# Patient Record
Sex: Female | Born: 1950
Health system: Southern US, Community
[De-identification: ages and names within clinical notes are randomized; demographics above are authoritative.]

## PROBLEM LIST (undated history)

## (undated) DIAGNOSIS — T7840XA Allergy, unspecified, initial encounter: Secondary | ICD-10-CM

## (undated) DIAGNOSIS — H9190 Unspecified hearing loss, unspecified ear: Secondary | ICD-10-CM

## (undated) DIAGNOSIS — F419 Anxiety disorder, unspecified: Secondary | ICD-10-CM

## (undated) DIAGNOSIS — I10 Essential (primary) hypertension: Secondary | ICD-10-CM

## (undated) DIAGNOSIS — K219 Gastro-esophageal reflux disease without esophagitis: Secondary | ICD-10-CM

## (undated) DIAGNOSIS — K5790 Diverticulosis of intestine, part unspecified, without perforation or abscess without bleeding: Secondary | ICD-10-CM

## (undated) DIAGNOSIS — J45909 Unspecified asthma, uncomplicated: Secondary | ICD-10-CM

## (undated) DIAGNOSIS — K589 Irritable bowel syndrome without diarrhea: Secondary | ICD-10-CM

## (undated) DIAGNOSIS — N3281 Overactive bladder: Secondary | ICD-10-CM

## (undated) DIAGNOSIS — J449 Chronic obstructive pulmonary disease, unspecified: Secondary | ICD-10-CM

## (undated) DIAGNOSIS — E785 Hyperlipidemia, unspecified: Secondary | ICD-10-CM

## (undated) HISTORY — DX: Allergy, unspecified, initial encounter: T78.40XA

## (undated) HISTORY — DX: Essential (primary) hypertension: I10

## (undated) HISTORY — DX: Diverticulosis of intestine, part unspecified, without perforation or abscess without bleeding: K57.90

## (undated) HISTORY — DX: Unspecified asthma, uncomplicated: J45.909

## (undated) HISTORY — DX: Irritable bowel syndrome, unspecified: K58.9

## (undated) HISTORY — DX: Anxiety disorder, unspecified: F41.9

## (undated) HISTORY — DX: Hyperlipidemia, unspecified: E78.5

## (undated) HISTORY — DX: Overactive bladder: N32.81

## (undated) HISTORY — DX: Gastro-esophageal reflux disease without esophagitis: K21.9

---
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
## 1977-05-27 HISTORY — PX: PARTIAL HYSTERECTOMY: SHX80

## 1995-05-28 HISTORY — PX: ABDOMINAL HYSTERECTOMY: SHX81

## 1995-05-28 HISTORY — PX: APPENDECTOMY: SHX54

## 2004-09-26 ENCOUNTER — Ambulatory Visit: Payer: Self-pay | Admitting: Family Medicine

## 2004-11-06 ENCOUNTER — Ambulatory Visit: Payer: Self-pay | Admitting: Internal Medicine

## 2005-10-18 ENCOUNTER — Ambulatory Visit: Payer: Self-pay | Admitting: Family Medicine

## 2007-07-03 ENCOUNTER — Ambulatory Visit: Payer: Self-pay | Admitting: Family Medicine

## 2009-07-12 ENCOUNTER — Emergency Department: Payer: Self-pay | Admitting: Unknown Physician Specialty

## 2012-01-23 DIAGNOSIS — Z0189 Encounter for other specified special examinations: Secondary | ICD-10-CM | POA: Insufficient documentation

## 2013-05-27 HISTORY — PX: COLONOSCOPY: SHX174

## 2013-06-24 ENCOUNTER — Ambulatory Visit: Payer: Self-pay | Admitting: Family Medicine

## 2013-10-19 LAB — HM COLONOSCOPY

## 2013-12-13 DIAGNOSIS — K5790 Diverticulosis of intestine, part unspecified, without perforation or abscess without bleeding: Secondary | ICD-10-CM | POA: Insufficient documentation

## 2014-08-10 LAB — LIPID PANEL
Cholesterol: 182 mg/dL (ref 0–200)
HDL: 51 mg/dL (ref 35–70)
LDL Cholesterol: 102 mg/dL
TRIGLYCERIDES: 145 mg/dL (ref 40–160)

## 2014-08-10 LAB — TSH: TSH: 3.9 u[IU]/mL (ref <=5.90)

## 2014-08-11 LAB — HM MAMMOGRAPHY: HM Mammogram: NORMAL

## 2014-09-25 HISTORY — PX: UPPER GASTROINTESTINAL ENDOSCOPY: SHX188

## 2014-10-05 ENCOUNTER — Encounter: Payer: Self-pay | Admitting: Internal Medicine

## 2014-10-05 DIAGNOSIS — I10 Essential (primary) hypertension: Secondary | ICD-10-CM | POA: Insufficient documentation

## 2014-10-05 DIAGNOSIS — J302 Other seasonal allergic rhinitis: Secondary | ICD-10-CM | POA: Insufficient documentation

## 2014-10-05 DIAGNOSIS — Z8719 Personal history of other diseases of the digestive system: Secondary | ICD-10-CM | POA: Insufficient documentation

## 2014-10-05 DIAGNOSIS — J4520 Mild intermittent asthma, uncomplicated: Secondary | ICD-10-CM | POA: Insufficient documentation

## 2014-10-05 DIAGNOSIS — K2100 Gastro-esophageal reflux disease with esophagitis, without bleeding: Secondary | ICD-10-CM | POA: Insufficient documentation

## 2014-10-05 DIAGNOSIS — E782 Mixed hyperlipidemia: Secondary | ICD-10-CM | POA: Insufficient documentation

## 2014-10-05 DIAGNOSIS — K648 Other hemorrhoids: Secondary | ICD-10-CM | POA: Insufficient documentation

## 2014-10-05 DIAGNOSIS — K581 Irritable bowel syndrome with constipation: Secondary | ICD-10-CM | POA: Insufficient documentation

## 2014-11-21 ENCOUNTER — Encounter: Payer: Self-pay | Admitting: Internal Medicine

## 2014-11-22 ENCOUNTER — Ambulatory Visit (INDEPENDENT_AMBULATORY_CARE_PROVIDER_SITE_OTHER): Payer: Medicare PPO | Admitting: Internal Medicine

## 2014-11-22 ENCOUNTER — Encounter: Payer: Self-pay | Admitting: Internal Medicine

## 2014-11-22 ENCOUNTER — Other Ambulatory Visit: Payer: Self-pay

## 2014-11-22 VITALS — BP 132/82 | HR 82 | Temp 98.0°F | Ht 65.0 in | Wt 153.0 lb

## 2014-11-22 DIAGNOSIS — K5732 Diverticulitis of large intestine without perforation or abscess without bleeding: Secondary | ICD-10-CM | POA: Diagnosis not present

## 2014-11-22 MED ORDER — CIPROFLOXACIN HCL 500 MG PO TABS: 500.0000 mg | ORAL_TABLET | Freq: Two times a day (BID) | ORAL | Status: DC

## 2014-11-22 MED ORDER — METRONIDAZOLE 500 MG PO TABS: 500.0000 mg | ORAL_TABLET | Freq: Three times a day (TID) | ORAL | Status: DC

## 2014-11-22 MED ORDER — PROMETHAZINE HCL 25 MG PO TABS: 25.0000 mg | ORAL_TABLET | Freq: Three times a day (TID) | ORAL | Status: DC | PRN

## 2014-11-22 NOTE — Patient Instructions (Signed)

## 2014-11-22 NOTE — Progress Notes (Signed)
Date:  11/22/2014   Name:  Sarah Hernandez   DOB:  1950/08/11   MRN:  [id number]   Chief Complaint: Abdominal Pain Abdominal Pain This is a new problem. The current episode started in the past 7 days. The onset quality is gradual. The problem has been unchanged. The pain is located in the RLQ. The pain is at a severity of 3/10. The patient is experiencing no pain. The quality of the pain is colicky and cramping. Associated symptoms include belching, diarrhea, flatus, nausea and vomiting (one episode). Pertinent negatives include no constipation, fever or headaches. Relieved by: pain medication. She has tried acetaminophen (bland diet) for the symptoms. The treatment provided mild relief.  She thought her symptoms might have been triggered by eating ice cream but that was 10 days ago.  She did have popcorn recently and has a history of diverticulitis.  Review of Systems:  Review of Systems  Constitutional: Negative for fever, chills and fatigue.  Respiratory: Negative for chest tightness and shortness of breath.   Cardiovascular: Negative for chest pain.  Gastrointestinal: Positive for nausea, vomiting (one episode), abdominal pain, diarrhea and flatus. Negative for constipation and blood in stool.  Genitourinary: Negative for urgency and pelvic pain.  Musculoskeletal: Negative for back pain.  Allergic/Immunologic: Negative for food allergies.  Neurological: Negative for headaches.    Patient Active Problem List   Diagnosis Date Noted  . Esophagitis, reflux 10/05/2014  . Hemorrhoids, internal 10/05/2014  . History of digestive system disease 10/05/2014  . HLD (hyperlipidemia) 10/05/2014  . Benign hypertension 10/05/2014  . Irritable bowel syndrome with constipation 10/05/2014  . Asthma, mild intermittent 10/05/2014  . Allergic rhinitis, seasonal 10/05/2014    Prior to Admission medications   Medication Sig Start Date End Date Taking? Authorizing Provider  albuterol (PROVENTIL  HFA;VENTOLIN HFA) 108 (90 BASE) MCG/ACT inhaler Inhale 2 puffs into the lungs 4 (four) times daily as needed. 08/10/14  Yes Historical Provider, MD  albuterol (PROVENTIL) (2.5 MG/3ML) 0.083% nebulizer solution Inhale 3 mLs into the lungs 4 (four) times daily as needed.   Yes Historical Provider, MD  atorvastatin (LIPITOR) 80 MG tablet Take 0.5 tablets by mouth at bedtime. 08/10/14  Yes Historical Provider, MD  hyoscyamine (LEVSIN, ANASPAZ) 0.125 MG tablet Take 1 tablet by mouth 4 (four) times daily as needed. 08/10/14  Yes Historical Provider, MD  lisinopril (PRINIVIL,ZESTRIL) 20 MG tablet Take 1 tablet by mouth daily. 08/10/14  Yes Historical Provider, MD  omeprazole (PRILOSEC OTC) 20 MG tablet Take 1 tablet by mouth daily. 08/17/14  Yes Historical Provider, MD  pantoprazole (PROTONIX) 40 MG tablet Take 1 tablet by mouth 2 (two) times daily. 10/05/14  Yes Historical Provider, MD  sertraline (ZOLOFT) 50 MG tablet Take 1 tablet by mouth daily. 08/10/14  Yes Historical Provider, MD    No Known Allergies  Past Surgical History  Procedure Laterality Date  . Partial hysterectomy  1979    bleeding from fibroids  . Tonsillectomy  1966  . Upper gastrointestinal endoscopy  09/2014    esophagitis, gastritis - pathology benign  . Colonoscopy  2015    diverticulosis    History  Substance Use Topics  . Smoking status: Never Smoker   . Smokeless tobacco: Not on file  . Alcohol Use: 4.2 oz/week    7 Standard drinks or equivalent per week     Medication list has been reviewed and updated.  Physical Examination:  Physical Exam  Constitutional: She appears well-developed and well-nourished. No distress.  Neck: Normal range of motion. Neck supple.  Cardiovascular: Normal rate, regular rhythm and normal heart sounds.   Pulmonary/Chest: Effort normal and breath sounds normal.  Abdominal: Soft. Normal appearance. Bowel sounds are increased. There is no hepatosplenomegaly. There is tenderness in the left  lower quadrant. There is no rigidity, no rebound and no guarding.    BP 132/82 mmHg  Pulse 82  Temp(Src) 98 F (36.7 C)  Ht 5\' 5"  (1.651 m)  Wt 153 lb (69.4 kg)  BMI 25.46 kg/m2  SpO2 97%  Assessment and Plan: 1. Diverticulitis of large intestine without perforation or abscess without bleeding Continue bland diet and sufficient fluids Follow up if worsening symptoms - ciprofloxacin (CIPRO) 500 MG tablet; Take 1 tablet (500 mg total) by mouth 2 (two) times daily.  Dispense: 20 tablet; Refill: 0 - metroNIDAZOLE (FLAGYL) 500 MG tablet; Take 1 tablet (500 mg total) by mouth 3 (three) times daily.  Dispense: 30 tablet; Refill: 0 - promethazine (PHENERGAN) 25 MG tablet; Take 1 tablet (25 mg total) by mouth every 8 (eight) hours as needed for nausea or vomiting.  Dispense: 20 tablet; Refill: 0 - CBC with Differential/Platelet - Comprehensive metabolic panel   Bari EdwardLaura Anaka Beazer, MD [facility]  11/22/2014

## 2014-11-23 LAB — COMPREHENSIVE METABOLIC PANEL
ALT: 12 IU/L (ref 0–32)
AST: 12 IU/L (ref 0–40)
Albumin/Globulin Ratio: 1.4 (ref 1.1–2.5)
Albumin: 4.1 g/dL (ref 3.6–4.8)
Alkaline Phosphatase: 130 IU/L — ABNORMAL HIGH (ref 39–117)
BUN / CREAT RATIO: 18 (ref 11–26)
BUN: 14 mg/dL (ref 8–27)
Bilirubin Total: 0.4 mg/dL (ref 0.0–1.2)
CALCIUM: 9.4 mg/dL (ref 8.7–10.3)
CHLORIDE: 99 mmol/L (ref 97–108)
CO2: 24 mmol/L (ref 18–29)
Creatinine, Ser: 0.8 mg/dL (ref 0.57–1.00)
GFR calc non Af Amer: 77 mL/min/{1.73_m2} (ref >59)
GFR, EST AFRICAN AMERICAN: 89 mL/min/{1.73_m2} (ref >59)
Globulin, Total: 3 g/dL (ref 1.5–4.5)
Glucose: 87 mg/dL (ref 65–99)
POTASSIUM: 4.5 mmol/L (ref 3.5–5.2)
SODIUM: 140 mmol/L (ref 134–144)
TOTAL PROTEIN: 7.1 g/dL (ref 6.0–8.5)

## 2014-11-23 LAB — CBC WITH DIFFERENTIAL/PLATELET
Basophils Absolute: 0 10*3/uL (ref 0.0–0.2)
Basos: 0 %
EOS (ABSOLUTE): 0.2 10*3/uL (ref 0.0–0.4)
Eos: 2 %
HEMATOCRIT: 38 % (ref 34.0–46.6)
Hemoglobin: 12.6 g/dL (ref 11.1–15.9)
IMMATURE GRANS (ABS): 0 10*3/uL (ref 0.0–0.1)
IMMATURE GRANULOCYTES: 0 %
LYMPHS: 19 %
Lymphocytes Absolute: 1.5 10*3/uL (ref 0.7–3.1)
MCH: 28.6 pg (ref 26.6–33.0)
MCHC: 33.2 g/dL (ref 31.5–35.7)
MCV: 86 fL (ref 79–97)
MONOCYTES: 6 %
Monocytes Absolute: 0.5 10*3/uL (ref 0.1–0.9)
NEUTROS PCT: 73 %
Neutrophils Absolute: 6 10*3/uL (ref 1.4–7.0)
Platelets: 420 10*3/uL — ABNORMAL HIGH (ref 150–379)
RBC: 4.4 x10E6/uL (ref 3.77–5.28)
RDW: 13.6 % (ref 12.3–15.4)
WBC: 8.2 10*3/uL (ref 3.4–10.8)

## 2015-03-08 ENCOUNTER — Ambulatory Visit (INDEPENDENT_AMBULATORY_CARE_PROVIDER_SITE_OTHER): Payer: Medicare PPO

## 2015-03-08 DIAGNOSIS — Z23 Encounter for immunization: Secondary | ICD-10-CM

## 2015-04-25 ENCOUNTER — Other Ambulatory Visit: Payer: Self-pay

## 2015-04-25 ENCOUNTER — Encounter: Payer: Self-pay | Admitting: Internal Medicine

## 2015-04-25 MED ORDER — SERTRALINE HCL 50 MG PO TABS: 50.0000 mg | ORAL_TABLET | Freq: Every day | ORAL | Status: DC

## 2015-05-02 NOTE — Telephone Encounter (Signed)
pts coming in on 08/16/15

## 2015-08-03 ENCOUNTER — Other Ambulatory Visit: Payer: Self-pay | Admitting: Internal Medicine

## 2015-08-09 NOTE — Telephone Encounter (Signed)
pts coming in on 08/16/15 @ 830 for cpe

## 2015-08-11 ENCOUNTER — Other Ambulatory Visit: Payer: Self-pay | Admitting: Internal Medicine

## 2015-08-11 ENCOUNTER — Encounter: Payer: Self-pay | Admitting: Internal Medicine

## 2015-08-16 ENCOUNTER — Encounter: Payer: Self-pay | Admitting: Internal Medicine

## 2015-08-16 ENCOUNTER — Other Ambulatory Visit: Payer: Self-pay | Admitting: Internal Medicine

## 2015-08-16 ENCOUNTER — Ambulatory Visit (INDEPENDENT_AMBULATORY_CARE_PROVIDER_SITE_OTHER): Payer: Medicare PPO | Admitting: Internal Medicine

## 2015-08-16 VITALS — BP 142/86 | HR 88 | Ht 65.0 in | Wt 162.6 lb

## 2015-08-16 DIAGNOSIS — K2100 Gastro-esophageal reflux disease with esophagitis, without bleeding: Secondary | ICD-10-CM

## 2015-08-16 DIAGNOSIS — E782 Mixed hyperlipidemia: Secondary | ICD-10-CM | POA: Diagnosis not present

## 2015-08-16 DIAGNOSIS — Z1231 Encounter for screening mammogram for malignant neoplasm of breast: Secondary | ICD-10-CM | POA: Diagnosis not present

## 2015-08-16 DIAGNOSIS — I10 Essential (primary) hypertension: Secondary | ICD-10-CM | POA: Diagnosis not present

## 2015-08-16 DIAGNOSIS — Z1159 Encounter for screening for other viral diseases: Secondary | ICD-10-CM

## 2015-08-16 DIAGNOSIS — K581 Irritable bowel syndrome with constipation: Secondary | ICD-10-CM

## 2015-08-16 DIAGNOSIS — Z0000 Encounter for general adult medical examination without abnormal findings: Secondary | ICD-10-CM | POA: Diagnosis not present

## 2015-08-16 DIAGNOSIS — J4520 Mild intermittent asthma, uncomplicated: Secondary | ICD-10-CM | POA: Diagnosis not present

## 2015-08-16 LAB — POCT URINALYSIS DIPSTICK
Bilirubin, UA: NEGATIVE
GLUCOSE UA: NEGATIVE
Ketones, UA: NEGATIVE
LEUKOCYTES UA: NEGATIVE
NITRITE UA: NEGATIVE
PROTEIN UA: NEGATIVE
SPEC GRAV UA: 1.01
UROBILINOGEN UA: 0.2
pH, UA: 6

## 2015-08-16 MED ORDER — OMEPRAZOLE 20 MG PO CPDR: 20.0000 mg | DELAYED_RELEASE_CAPSULE | Freq: Two times a day (BID) | ORAL | Status: DC

## 2015-08-16 MED ORDER — ALBUTEROL SULFATE HFA 108 (90 BASE) MCG/ACT IN AERS: 2.0000 | INHALATION_SPRAY | Freq: Four times a day (QID) | RESPIRATORY_TRACT | Status: DC | PRN

## 2015-08-16 MED ORDER — ATORVASTATIN CALCIUM 40 MG PO TABS
40.0000 mg | ORAL_TABLET | Freq: Every day | ORAL | Status: DC
Start: 2015-08-16 — End: 2016-08-21

## 2015-08-16 NOTE — Progress Notes (Signed)
Patient: Sarah Hernandez, Female    DOB: 14-May-1951, 65 y.o.   MRN: [id number] Visit Date: 08/16/2015  Today's Provider: Bari EdwardLaura Keyia Moretto, MD   Chief Complaint  Patient presents with  . Medicare Wellness  . Hypertension  . Hyperlipidemia   Subjective:    Annual wellness visit Sarah GamblesLinda G Hernandez is a 65 y.o. female who presents today for her Subsequent Annual Wellness Visit. She feels well. She reports exercising regularly. She reports she is sleeping well.   ----------------------------------------------------------- Hypertension This is a chronic problem. The current episode started more than 1 year ago. The problem is unchanged. The problem is controlled. Pertinent negatives include no chest pain, headaches, palpitations or shortness of breath.  Hyperlipidemia This is a chronic problem. The current episode started more than 1 year ago. Recent lipid tests were reviewed and are normal. Pertinent negatives include no chest pain or shortness of breath.  Gastroesophageal Reflux She complains of belching and heartburn. She reports no abdominal pain, no chest pain, no coughing, no hoarse voice, no tooth decay, no water brash or no wheezing. This is a chronic problem. The problem occurs occasionally. The symptoms are aggravated by certain foods. Pertinent negatives include no fatigue. She has tried a PPI for the symptoms.  Asthma There is no cough, hoarse voice, shortness of breath or wheezing. This is a recurrent problem. The problem occurs rarely. The problem has been unchanged. Associated symptoms include heartburn. Pertinent negatives include no chest pain, fever, headaches or trouble swallowing. Her symptoms are aggravated by pollen and change in weather. Her past medical history is significant for asthma.  IBS - doing well with zoloft.  She has much less bloating and constipation since starting medication.  She is very happy with the results.  Her last colonoscopy was in 2015.  Review of  Systems  Constitutional: Negative for fever, chills and fatigue.  HENT: Negative for congestion, hearing loss, hoarse voice, tinnitus, trouble swallowing and voice change.   Eyes: Negative for visual disturbance.  Respiratory: Negative for cough, chest tightness, shortness of breath and wheezing.   Cardiovascular: Negative for chest pain, palpitations and leg swelling.  Gastrointestinal: Positive for heartburn. Negative for vomiting, abdominal pain, diarrhea, constipation and blood in stool.  Endocrine: Negative for polydipsia and polyuria.  Genitourinary: Negative for dysuria, frequency, vaginal bleeding, vaginal discharge and genital sores.  Musculoskeletal: Negative for joint swelling, arthralgias and gait problem.  Skin: Negative for color change and rash.  Neurological: Negative for dizziness, tremors, light-headedness and headaches.  Hematological: Negative for adenopathy. Does not bruise/bleed easily.  Psychiatric/Behavioral: Negative for sleep disturbance and dysphoric mood. The patient is not nervous/anxious.     Social History   Social History  . Marital Status: Married    Spouse Name: N/A  . Number of Children: N/A  . Years of Education: N/A   Occupational History  . Not on file.   Social History Main Topics  . Smoking status: Never Smoker   . Smokeless tobacco: Not on file  . Alcohol Use: 4.2 oz/week    7 Standard drinks or equivalent per week     Comment: occasional  . Drug Use: No  . Sexual Activity: Not on file   Other Topics Concern  . Not on file   Social History Narrative    Patient Active Problem List   Diagnosis Date Noted  . Esophagitis, reflux 10/05/2014  . Hemorrhoids, internal 10/05/2014  . Hyperlipidemia, mixed 10/05/2014  . Benign hypertension 10/05/2014  . Irritable bowel syndrome  with constipation 10/05/2014  . Asthma, mild intermittent 10/05/2014  . Allergic rhinitis, seasonal 10/05/2014  . DD (diverticular disease) 12/13/2013  .  Encounter for other specified special examinations 01/23/2012    Past Surgical History  Procedure Laterality Date  . Partial hysterectomy  1979    bleeding from fibroids  . Tonsillectomy  1966  . Upper gastrointestinal endoscopy  09/2014    gastritis/esoph; no Barrett's  . Colonoscopy  2015    diverticulosis    Her family history includes CAD in her brother and father; Dementia in her mother; Stroke in her brother.    Previous Medications   ALBUTEROL (PROVENTIL HFA;VENTOLIN HFA) 108 (90 BASE) MCG/ACT INHALER    Inhale 2 puffs into the lungs 4 (four) times daily as needed.   ALBUTEROL (PROVENTIL) (2.5 MG/3ML) 0.083% NEBULIZER SOLUTION    Inhale 3 mLs into the lungs 4 (four) times daily as needed. Reported on 08/16/2015   ATORVASTATIN (LIPITOR) 80 MG TABLET    Take 0.5 tablets by mouth at bedtime.   LISINOPRIL (PRINIVIL,ZESTRIL) 20 MG TABLET    TAKE ONE TABLET BY MOUTH ONCE DAILY   OMEPRAZOLE (PRILOSEC) 20 MG CAPSULE    Take 20 mg by mouth 2 (two) times daily before a meal.   SERTRALINE (ZOLOFT) 50 MG TABLET    1 (ONE) TABLET, ORAL, DAILY    Patient Care Team: Reubin Milan, MD as PCP - General (Family Medicine)     Objective:   Vitals: BP 142/86 mmHg  Pulse 88  Ht  (1.651 m)  Wt 162 lb 9.6 oz (73.755 kg)  BMI 27.06 kg/m2  Physical Exam  Constitutional: She is oriented to person, place, and time. She appears well-developed and well-nourished. No distress.  HENT:  Head: Normocephalic and atraumatic.  Right Ear: Tympanic membrane and ear canal normal.  Left Ear: Tympanic membrane and ear canal normal.  Nose: Right sinus exhibits no maxillary sinus tenderness. Left sinus exhibits no maxillary sinus tenderness.  Mouth/Throat: Uvula is midline and oropharynx is clear and moist.  Eyes: Conjunctivae and EOM are normal. Right eye exhibits no discharge. Left eye exhibits no discharge. No scleral icterus.  Neck: Normal range of motion. Carotid bruit is not present. No erythema  present. No thyromegaly present.  Cardiovascular: Normal rate, regular rhythm, normal heart sounds and normal pulses.   Pulmonary/Chest: Effort normal. No respiratory distress. She has no wheezes. Right breast exhibits no mass, no nipple discharge, no skin change and no tenderness. Left breast exhibits no mass, no nipple discharge, no skin change and no tenderness.  Abdominal: Soft. Bowel sounds are normal. There is no hepatosplenomegaly. There is no tenderness. There is no CVA tenderness.  Musculoskeletal: Normal range of motion.  Lymphadenopathy:    She has no cervical adenopathy.    She has no axillary adenopathy.  Neurological: She is alert and oriented to person, place, and time. She has normal reflexes. No cranial nerve deficit or sensory deficit.  Skin: Skin is warm, dry and intact. No rash noted.  Psychiatric: She has a normal mood and affect. Her speech is normal and behavior is normal. Thought content normal.  Nursing note and vitals reviewed.   Activities of Daily Living In your present state of health, do you have any difficulty performing the following activities: 08/16/2015 11/22/2014  Hearing? N N  Vision? N N  Difficulty concentrating or making decisions? N N  Walking or climbing stairs? N N  Dressing or bathing? N N  Doing errands, shopping?  N N    Fall Risk Assessment Fall Risk  08/16/2015  Falls in the past year? No     Depression Screen PHQ 2/9 Scores 08/16/2015  PHQ - 2 Score 0    Cognitive Testing - 6-CIT   Correct? Score   What year is it? yes 0 Yes = 0    No = 4  What month is it? yes 0 Yes = 0    No = 3  Remember:     [place]ins, [address], [place]     What time is it? yes 0 Yes = 0    No = 3  Count backwards from 20 to 1 yes 0 Correct = 0    1 error = 2   More than 1 error = 4  Say the months of the year in reverse. yes 0 Correct = 0    1 error = 2   More than 1 error = 4  What address did I ask you to remember? yes 0 Correct = 0  1 error = 2      2 error = 4    3 error = 6    4 error = 8    All wrong = 10       TOTAL SCORE  0/28   Interpretation:  Normal  Normal (0-7) Abnormal (8-28)     Medicare Annual Wellness Visit Summary:  Reviewed patient's Family Medical History Reviewed and updated list of patient's medical providers Assessment of cognitive impairment was done Assessed patient's functional ability Established a written schedule for health screening services Health Risk Assessent Completed and Reviewed  Exercise Activities and Dietary recommendations Goals    None      Immunization History  Administered Date(s) Administered  . Influenza,inj,Quad PF,36+ Mos 03/08/2015  . Pneumococcal Conjugate-13 08/10/2014  . Pneumococcal Polysaccharide-23 06/15/2013  . Tdap 05/28/2008  . Zoster 05/28/2012    Health Maintenance  Topic Date Due  . Hepatitis C Screening  [birth year]  . INFLUENZA VACCINE  12/26/2015  . MAMMOGRAM  08/10/2016  . TETANUS/TDAP  05/28/2018  . COLONOSCOPY  10/20/2023  . DEXA SCAN  Addressed  . ZOSTAVAX  Completed  . PNA vac Low Risk Adult  Completed     Discussed health benefits of physical activity, and encouraged her to engage in regular exercise appropriate for her age and condition.    ------------------------------------------------------------------------------------------------------------   Assessment & Plan:    1. Medicare annual wellness visit, subsequent Measures satisfied  2. Benign hypertension controlled - Comprehensive metabolic panel - TSH - POCT urinalysis dipstick  3. Esophagitis, reflux Stable on PPI - CBC with Differential/Platelet  4. Hyperlipidemia, mixed Doing well on statin therapy - Lipid panel  5. Encounter for screening mammogram for breast cancer Patient has Mammogram scheduled at [facility]  6. Need for hepatitis C screening test Patient reports blood transfusion in the 1970s - Hepatitis C antibody  7. Irritable bowel syndrome with  constipation Doing well on SSRI  8. Asthma, mild intermittent, uncomplicated Continue albuterol MDI PRN   Bari Edward, MD [facility]  08/16/2015

## 2015-08-16 NOTE — Patient Instructions (Signed)
Health Maintenance  Topic Date Due  . Hepatitis C Screening  [birth year]  . INFLUENZA VACCINE  12/26/2015  . MAMMOGRAM  08/10/2016  . TETANUS/TDAP  05/28/2018  . COLONOSCOPY  10/20/2023  . DEXA SCAN  Addressed  . ZOSTAVAX  Completed  . PNA vac Low Risk Adult  Completed   Breast Self-Awareness Practicing breast self-awareness may pick up problems early, prevent significant medical complications, and possibly save your life. By practicing breast self-awareness, you can become familiar with how your breasts look and feel and if your breasts are changing. This allows you to notice changes early. It can also offer you some reassurance that your breast health is good. One way to learn what is normal for your breasts and whether your breasts are changing is to do a breast self-exam. If you find a lump or something that was not present in the past, it is best to contact your caregiver right away. Other findings that should be evaluated by your caregiver include nipple discharge, especially if it is bloody; skin changes or reddening; areas where the skin seems to be pulled in (retracted); or new lumps and bumps. Breast pain is seldom associated with cancer (malignancy), but should also be evaluated by a caregiver. HOW TO PERFORM A BREAST SELF-EXAM The best time to examine your breasts is 5-7 days after your menstrual period is over. During menstruation, the breasts are lumpier, and it may be more difficult to pick up changes. If you do not menstruate, have reached menopause, or had your uterus removed (hysterectomy), you should examine your breasts at regular intervals, such as monthly. If you are breastfeeding, examine your breasts after a feeding or after using a breast pump. Breast implants do not decrease the risk for lumps or tumors, so continue to perform breast self-exams as recommended. Talk to your caregiver about how to determine the difference between the implant and breast tissue. Also, talk about  the amount of pressure you should use during the exam. Over time, you will become more familiar with the variations of your breasts and more comfortable with the exam. A breast self-exam requires you to remove all your clothes above the waist. 1. Look at your breasts and nipples. Stand in front of a mirror in a room with good lighting. With your hands on your hips, push your hands firmly downward. Look for a difference in shape, contour, and size from one breast to the other (asymmetry). Asymmetry includes puckers, dips, or bumps. Also, look for skin changes, such as reddened or scaly areas on the breasts. Look for nipple changes, such as discharge, dimpling, repositioning, or redness. 2. Carefully feel your breasts. This is best done either in the shower or tub while using soapy water or when flat on your back. Place the arm (on the side of the breast you are examining) above your head. Use the pads (not the fingertips) of your three middle fingers on your opposite hand to feel your breasts. Start in the underarm area and use  inch (2 cm) overlapping circles to feel your breast. Use 3 different levels of pressure (light, medium, and firm pressure) at each circle before moving to the next circle. The light pressure is needed to feel the tissue closest to the skin. The medium pressure will help to feel breast tissue a little deeper, while the firm pressure is needed to feel the tissue close to the ribs. Continue the overlapping circles, moving downward over the breast until you feel your ribs below  your breast. Then, move one finger-width towards the center of the body. Continue to use the  inch (2 cm) overlapping circles to feel your breast as you move slowly up toward the collar bone (clavicle) near the base of the neck. Continue the up and down exam using all 3 pressures until you reach the middle of the chest. Do this with each breast, carefully feeling for lumps or changes. 3.  Keep a written record with  breast changes or normal findings for each breast. By writing this information down, you do not need to depend only on memory for size, tenderness, or location. Write down where you are in your menstrual cycle, if you are still menstruating. Breast tissue can have some lumps or thick tissue. However, see your caregiver if you find anything that concerns you.  SEEK MEDICAL CARE IF:  You see a change in shape, contour, or size of your breasts or nipples.   You see skin changes, such as reddened or scaly areas on the breasts or nipples.   You have an unusual discharge from your nipples.   You feel a new lump or unusually thick areas.    This information is not intended to replace advice given to you by your health care provider. Make sure you discuss any questions you have with your health care provider.   Document Released: 05/13/2005 Document Revised: 04/29/2012 Document Reviewed: 08/28/2011 Elsevier Interactive Patient Education [employer].

## 2015-08-17 ENCOUNTER — Telehealth: Payer: Self-pay

## 2015-08-17 DIAGNOSIS — Z1231 Encounter for screening mammogram for malignant neoplasm of breast: Secondary | ICD-10-CM | POA: Diagnosis not present

## 2015-08-17 LAB — CBC WITH DIFFERENTIAL/PLATELET
Basophils Absolute: 0 10*3/uL (ref 0.0–0.2)
Basos: 1 %
EOS (ABSOLUTE): 0.2 10*3/uL (ref 0.0–0.4)
EOS: 3 %
HEMATOCRIT: 35.6 % (ref 34.0–46.6)
HEMOGLOBIN: 12 g/dL (ref 11.1–15.9)
Immature Grans (Abs): 0 10*3/uL (ref 0.0–0.1)
Immature Granulocytes: 1 %
LYMPHS ABS: 2.1 10*3/uL (ref 0.7–3.1)
Lymphs: 31 %
MCH: 28.4 pg (ref 26.6–33.0)
MCHC: 33.7 g/dL (ref 31.5–35.7)
MCV: 84 fL (ref 79–97)
MONOCYTES: 6 %
Monocytes Absolute: 0.4 10*3/uL (ref 0.1–0.9)
NEUTROS ABS: 3.9 10*3/uL (ref 1.4–7.0)
Neutrophils: 58 %
Platelets: 316 10*3/uL (ref 150–379)
RBC: 4.22 x10E6/uL (ref 3.77–5.28)
RDW: 14.7 % (ref 12.3–15.4)
WBC: 6.6 10*3/uL (ref 3.4–10.8)

## 2015-08-17 LAB — LIPID PANEL
CHOL/HDL RATIO: 3 ratio (ref 0.0–4.4)
Cholesterol, Total: 156 mg/dL (ref 100–199)
HDL: 52 mg/dL (ref >39)
LDL Calculated: 82 mg/dL (ref 0–99)
TRIGLYCERIDES: 111 mg/dL (ref 0–149)
VLDL CHOLESTEROL CAL: 22 mg/dL (ref 5–40)

## 2015-08-17 LAB — COMPREHENSIVE METABOLIC PANEL
ALBUMIN: 4.3 g/dL (ref 3.6–4.8)
ALK PHOS: 110 IU/L (ref 39–117)
ALT: 17 IU/L (ref 0–32)
AST: 15 IU/L (ref 0–40)
Albumin/Globulin Ratio: 1.6 (ref 1.2–2.2)
BILIRUBIN TOTAL: 0.5 mg/dL (ref 0.0–1.2)
BUN / CREAT RATIO: 19 (ref 11–26)
BUN: 17 mg/dL (ref 8–27)
CHLORIDE: 102 mmol/L (ref 96–106)
CO2: 23 mmol/L (ref 18–29)
CREATININE: 0.89 mg/dL (ref 0.57–1.00)
Calcium: 9.2 mg/dL (ref 8.7–10.3)
GFR calc non Af Amer: 67 mL/min/{1.73_m2} (ref >59)
GFR, EST AFRICAN AMERICAN: 78 mL/min/{1.73_m2} (ref >59)
GLOBULIN, TOTAL: 2.7 g/dL (ref 1.5–4.5)
Glucose: 90 mg/dL (ref 65–99)
POTASSIUM: 4.3 mmol/L (ref 3.5–5.2)
Sodium: 140 mmol/L (ref 134–144)
TOTAL PROTEIN: 7 g/dL (ref 6.0–8.5)

## 2015-08-17 LAB — HEPATITIS C ANTIBODY: HCV Ab: <0.1 s/co ratio (ref 0.0–0.9)

## 2015-08-17 LAB — TSH: TSH: 3.88 u[IU]/mL (ref 0.450–4.500)

## 2015-08-17 NOTE — Telephone Encounter (Signed)
Spoke with patient. Patient advised of all results and verbalized understanding. Will call back with any future questions or concerns. MAH  

## 2015-08-17 NOTE — Telephone Encounter (Signed)
-----   Message from Reubin MilanLaura H Berglund, MD sent at 08/17/2015  1:42 PM EDT ----- Labs are normal.  Cholesterol is excellent.  Hep C is negative.

## 2015-11-10 ENCOUNTER — Other Ambulatory Visit: Payer: Self-pay | Admitting: Internal Medicine

## 2015-11-11 ENCOUNTER — Other Ambulatory Visit: Payer: Self-pay | Admitting: Internal Medicine

## 2015-11-13 ENCOUNTER — Other Ambulatory Visit: Payer: Self-pay | Admitting: Internal Medicine

## 2015-11-13 MED ORDER — SERTRALINE HCL 50 MG PO TABS: 50.0000 mg | ORAL_TABLET | Freq: Every day | ORAL | Status: DC

## 2016-01-06 IMAGING — CT CT ABD-PELV W/ CM
2 of 5 series · 17 of 46 positions shown, 19 images · IV contrast (isovue)
Comparison: CT scan dated 09/26/2004

CLINICAL DATA: Left lower quadrant pain.

EXAM:
CT ABDOMEN AND PELVIS WITH CONTRAST
TECHNIQUE: Multidetector CT imaging of the abdomen and pelvis was performed
using the standard protocol following bolus administration of
intravenous contrast.
CONTRAST:  Has cc Isovue 300

[Series 2: routine abd pel with · axial · 0.62mm/px · z∈[-504,-109]mm · 14 of 89 slices shown, 16 images]
[im 5/89  soft-tissue]
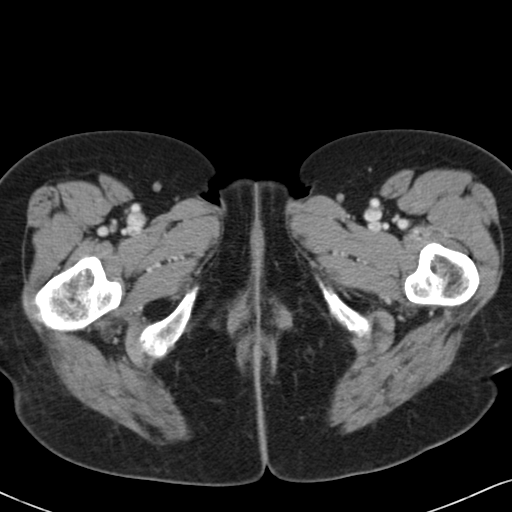
[im 5/89  bone]
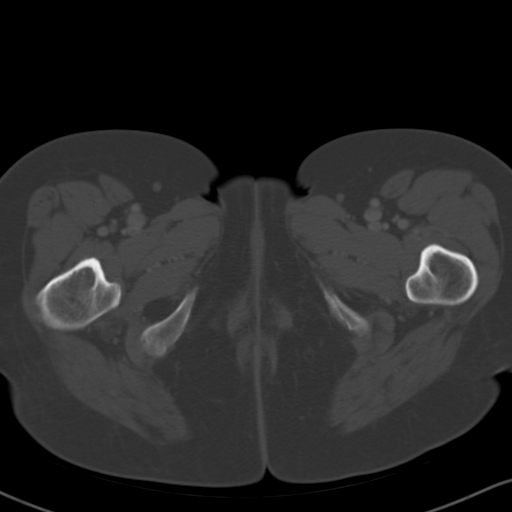
[im 14/89  soft-tissue]
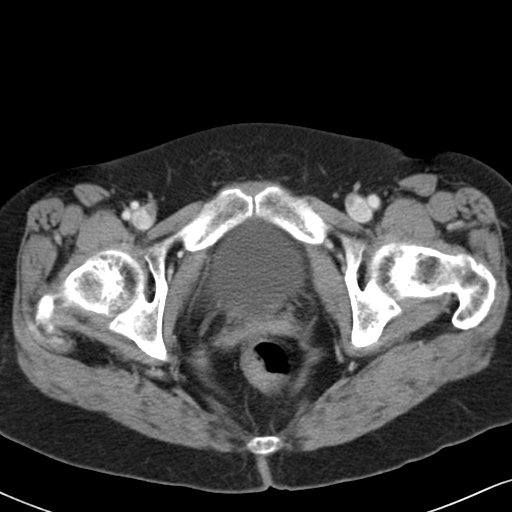
[im 18/89  soft-tissue]
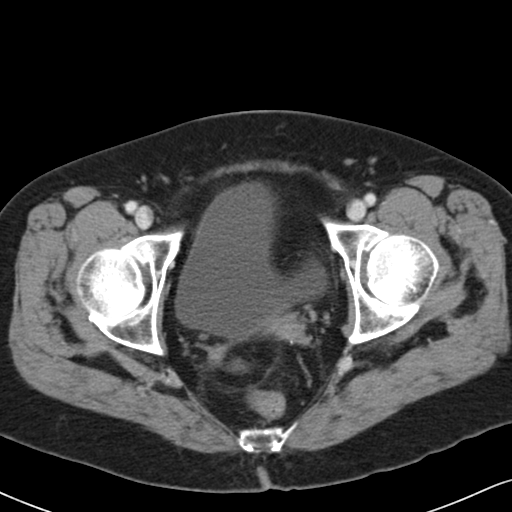
[im 23/89  soft-tissue]
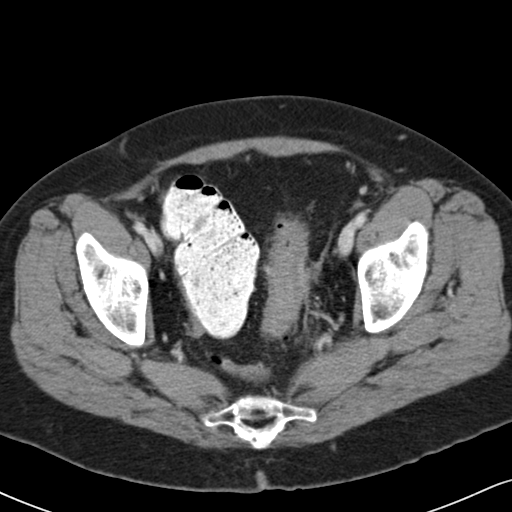
[im 31/89  soft-tissue]
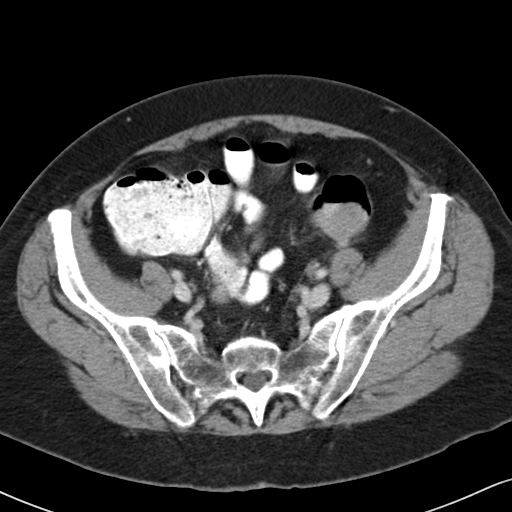
[im 36/89  soft-tissue]
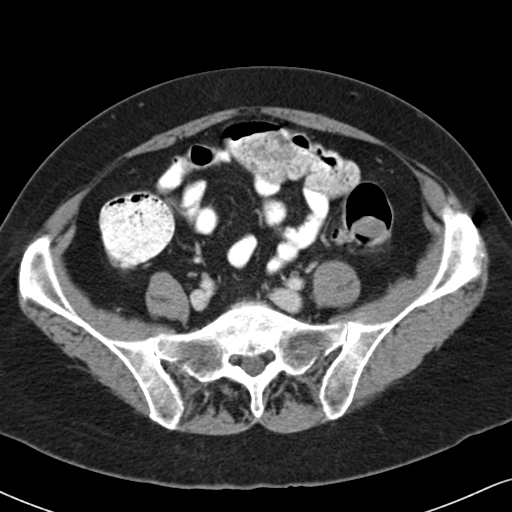
[im 40/89  soft-tissue]
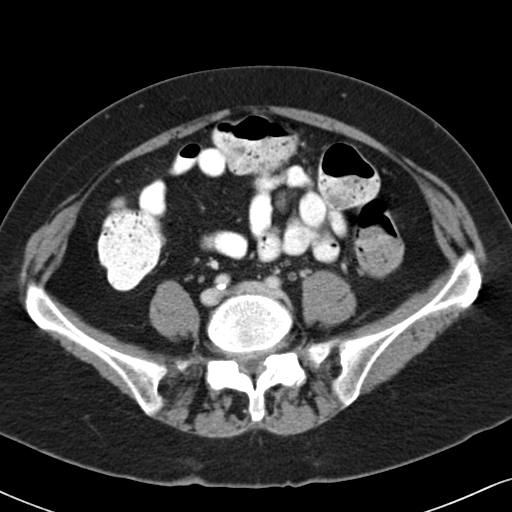
[im 49/89  soft-tissue]
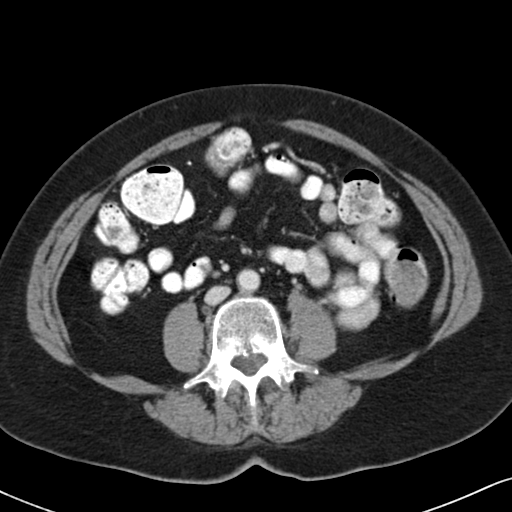
[im 53/89  soft-tissue]
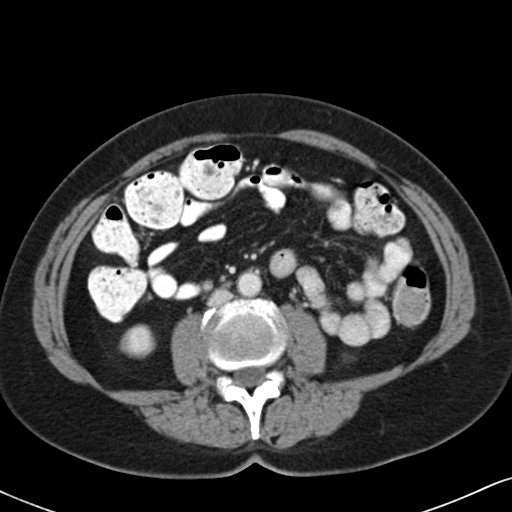
[im 53/89  bone]
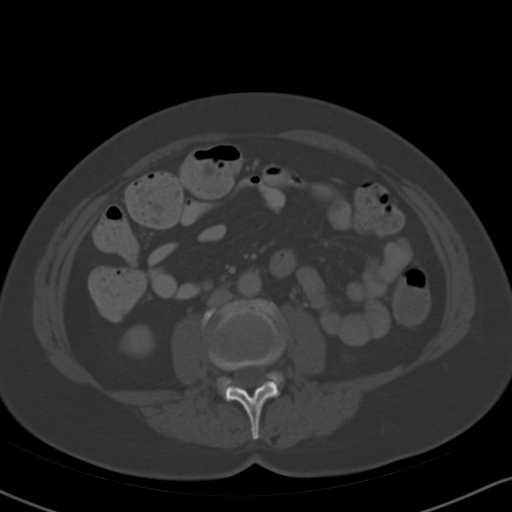
[im 58/89  soft-tissue]
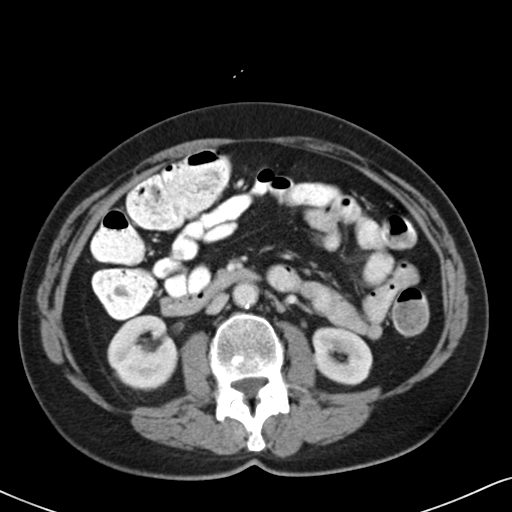
[im 67/89  soft-tissue]
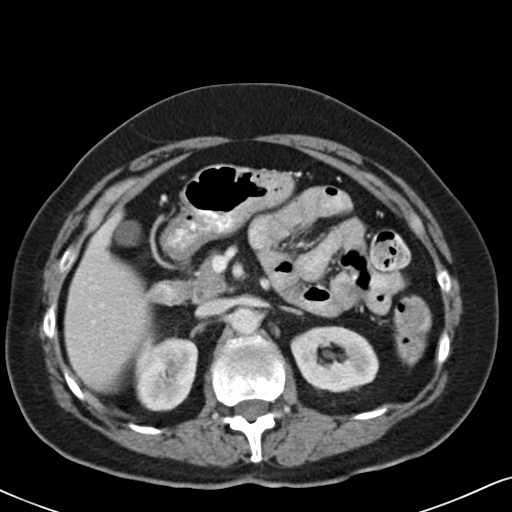
[im 71/89  soft-tissue]
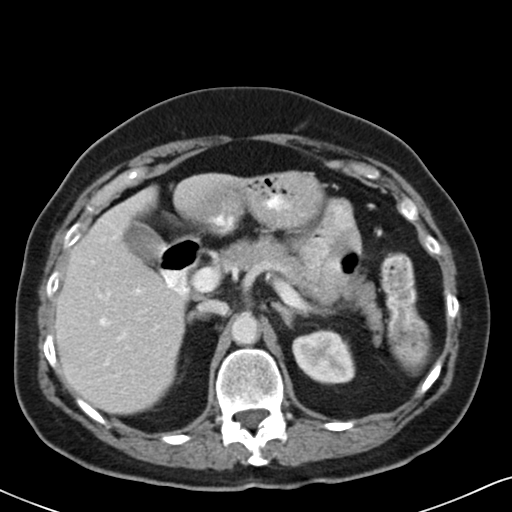
[im 75/89  soft-tissue]
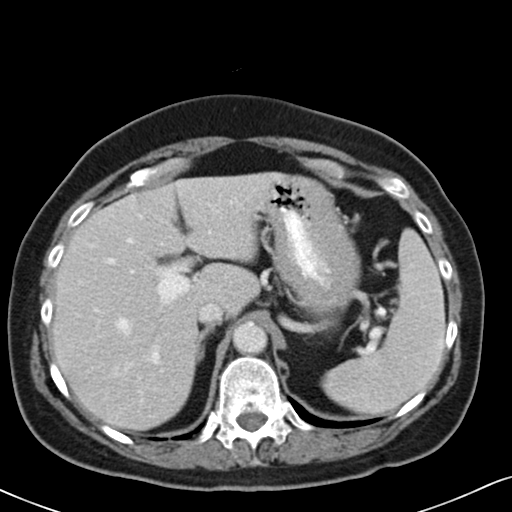
[im 84/89  soft-tissue]
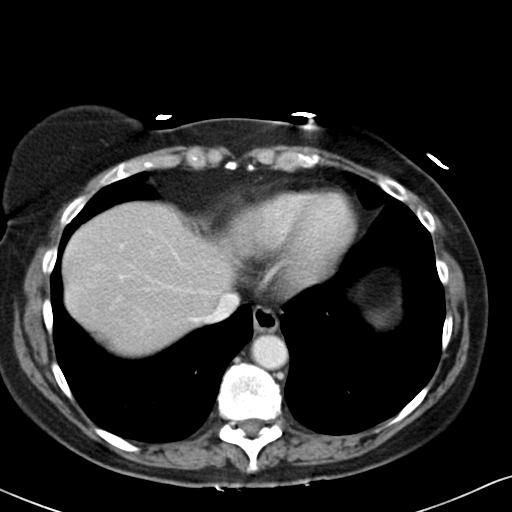

[Series 6: cor routine abd pel with · coronal · 0.89mm/px · 3 of 127 slices shown]
[im 43/127  soft-tissue]
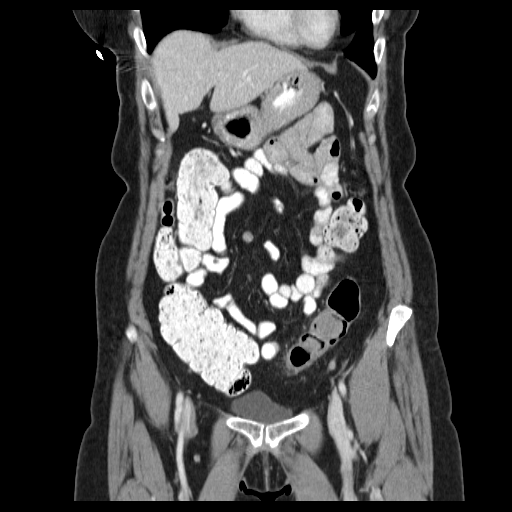
[im 57/127  soft-tissue]
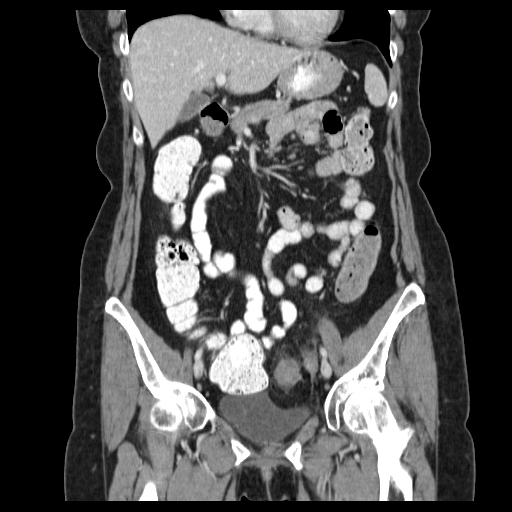
[im 71/127  soft-tissue]
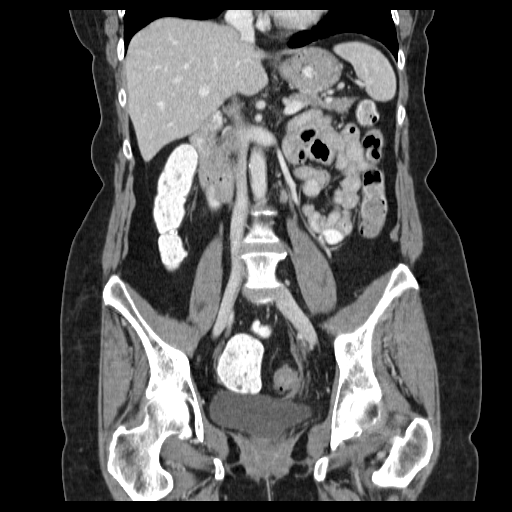

[17 of 46 positions shown; findings below may reference images not displayed]

FINDINGS: There is focal diverticulitis of the distal sigmoid portion of the
colon with small areas of either submucosal pus or edema visible on
images 66 and 67 and 68 of series 2. There is no diverticular
abscess or perforation. The bowel is otherwise normal.

Liver, biliary tree, spleen, pancreas, adrenal glands, and kidneys
are normal. No adenopathy. Uterus has been removed. Right ovary is
normal. The area of diverticulitis is immediately adjacent to the
normal appearing left ovary.

Bladder appears normal.  No significant osseous abnormality.
IMPRESSION: Acute distal sigmoid diverticulitis.

## 2016-02-14 ENCOUNTER — Ambulatory Visit (INDEPENDENT_AMBULATORY_CARE_PROVIDER_SITE_OTHER): Payer: Medicare PPO

## 2016-02-14 DIAGNOSIS — Z23 Encounter for immunization: Secondary | ICD-10-CM | POA: Diagnosis not present

## 2016-05-17 ENCOUNTER — Ambulatory Visit (INDEPENDENT_AMBULATORY_CARE_PROVIDER_SITE_OTHER): Payer: Medicare PPO | Admitting: Internal Medicine

## 2016-05-17 ENCOUNTER — Encounter: Payer: Self-pay | Admitting: Internal Medicine

## 2016-05-17 VITALS — BP 138/90 | HR 78 | Temp 97.6°F | Ht 65.0 in | Wt 162.0 lb

## 2016-05-17 DIAGNOSIS — K5732 Diverticulitis of large intestine without perforation or abscess without bleeding: Secondary | ICD-10-CM

## 2016-05-17 DIAGNOSIS — K581 Irritable bowel syndrome with constipation: Secondary | ICD-10-CM | POA: Diagnosis not present

## 2016-05-17 DIAGNOSIS — K2100 Gastro-esophageal reflux disease with esophagitis, without bleeding: Secondary | ICD-10-CM

## 2016-05-17 MED ORDER — ONDANSETRON HCL 4 MG PO TABS: 4.0000 mg | ORAL_TABLET | Freq: Three times a day (TID) | ORAL | 0 refills | Status: DC | PRN

## 2016-05-17 MED ORDER — CIPROFLOXACIN HCL 500 MG PO TABS: 500.0000 mg | ORAL_TABLET | Freq: Two times a day (BID) | ORAL | 0 refills | Status: DC

## 2016-05-17 MED ORDER — METRONIDAZOLE 500 MG PO TABS: 500.0000 mg | ORAL_TABLET | Freq: Three times a day (TID) | ORAL | 0 refills | Status: DC

## 2016-05-17 MED ORDER — HYOSCYAMINE SULFATE 0.125 MG PO TABS
0.1250 mg | ORAL_TABLET | ORAL | 1 refills | Status: DC | PRN
Start: 2016-05-17 — End: 2020-10-02

## 2016-05-17 MED ORDER — PANTOPRAZOLE SODIUM 40 MG PO TBEC
40.0000 mg | DELAYED_RELEASE_TABLET | Freq: Every day | ORAL | 3 refills | Status: DC
Start: 2016-05-17 — End: 2017-05-09

## 2016-05-17 NOTE — Progress Notes (Signed)
Date:  05/17/2016   Name:  Sarah Hernandez   DOB:  1950/09/20   MRN:  [id number]   Chief Complaint: Abdominal Pain Abdominal Pain  This is a new problem. The current episode started in the past 7 days. The onset quality is gradual. The problem occurs constantly. The problem has been gradually worsening. The pain is located in the LLQ. The pain is moderate. The quality of the pain is colicky and cramping. The abdominal pain does not radiate. Pertinent negatives include no constipation, diarrhea, dysuria, fever, hematuria, nausea or vomiting.      Review of Systems  Constitutional: Positive for fatigue. Negative for chills and fever.  Respiratory: Negative for cough, chest tightness, shortness of breath and wheezing.   Cardiovascular: Negative for chest pain and palpitations.  Gastrointestinal: Positive for abdominal distention and abdominal pain. Negative for constipation, diarrhea, nausea and vomiting.  Genitourinary: Negative for dysuria, hematuria and urgency.    Patient Active Problem List   Diagnosis Date Noted  . Esophagitis, reflux 10/05/2014  . Hemorrhoids, internal 10/05/2014  . Hyperlipidemia, mixed 10/05/2014  . Benign hypertension 10/05/2014  . Irritable bowel syndrome with constipation 10/05/2014  . Asthma, mild intermittent 10/05/2014  . Allergic rhinitis, seasonal 10/05/2014  . DD (diverticular disease) 12/13/2013  . Encounter for other specified special examinations 01/23/2012    Prior to Admission medications   Medication Sig Start Date End Date Taking? Authorizing Provider  albuterol (PROVENTIL HFA;VENTOLIN HFA) 108 (90 Base) MCG/ACT inhaler Inhale 2 puffs into the lungs 4 (four) times daily as needed. 08/16/15  Yes Reubin MilanLaura H Erisa Mehlman, MD  albuterol (PROVENTIL) (2.5 MG/3ML) 0.083% nebulizer solution Inhale 3 mLs into the lungs 4 (four) times daily as needed. Reported on 08/16/2015   Yes Historical Provider, MD  atorvastatin (LIPITOR) 40 MG tablet Take 1 tablet  (40 mg total) by mouth at bedtime. 08/16/15  Yes Reubin MilanLaura H Ronell Boldin, MD  hyoscyamine (LEVSIN) 0.125 MG/5ML ELIX Take 0.125 mg by mouth.   Yes Historical Provider, MD  lisinopril (PRINIVIL,ZESTRIL) 20 MG tablet TAKE ONE TABLET BY MOUTH ONCE DAILY 11/10/15  Yes Reubin MilanLaura H Nikki Rusnak, MD  pantoprazole (PROTONIX) 40 MG tablet Take 40 mg by mouth daily.   Yes Historical Provider, MD  sertraline (ZOLOFT) 50 MG tablet Take 1 tablet (50 mg total) by mouth daily. 11/13/15  Yes Reubin MilanLaura H Vegas Fritze, MD  omeprazole (PRILOSEC) 20 MG capsule Take 1 capsule (20 mg total) by mouth 2 (two) times daily before a meal. Patient not taking: Reported on 05/17/2016 08/16/15   Reubin MilanLaura H Ayisha Pol, MD    No Known Allergies  Past Surgical History:  Procedure Laterality Date  . COLONOSCOPY  2015   diverticulosis  . PARTIAL HYSTERECTOMY  1979   bleeding from fibroids  . TONSILLECTOMY  1966  . UPPER GASTROINTESTINAL ENDOSCOPY  09/2014   gastritis/esoph; no Barrett's    Social History  Substance Use Topics  . Smoking status: Never Smoker  . Smokeless tobacco: Not on file  . Alcohol use 4.2 oz/week    7 Standard drinks or equivalent per week     Comment: occasional     Medication list has been reviewed and updated.   Physical Exam  Constitutional: She is oriented to person, place, and time. She appears well-developed. No distress.  HENT:  Head: Normocephalic and atraumatic.  Cardiovascular: Normal rate, regular rhythm and normal heart sounds.   Pulmonary/Chest: Effort normal. No respiratory distress.  Abdominal: Normal appearance. Bowel sounds are decreased. There is tenderness in  the left lower quadrant. There is no rigidity, no guarding and no CVA tenderness.  Musculoskeletal: Normal range of motion.  Neurological: She is alert and oriented to person, place, and time.  Skin: Skin is warm and dry. No rash noted.  Psychiatric: She has a normal mood and affect. Her behavior is normal. Thought content normal.  Nursing  note and vitals reviewed.   BP 138/90   Pulse 78   Temp 97.6 F (36.4 C)   Ht 5\' 5"  (1.651 m)   Wt 162 lb (73.5 kg)   SpO2 97%   BMI 26.96 kg/m   Assessment and Plan: 1. Diverticulitis of large intestine without perforation or abscess without bleeding - metroNIDAZOLE (FLAGYL) 500 MG tablet; Take 1 tablet (500 mg total) by mouth 3 (three) times daily.  Dispense: 30 tablet; Refill: 0 - ciprofloxacin (CIPRO) 500 MG tablet; Take 1 tablet (500 mg total) by mouth 2 (two) times daily.  Dispense: 20 tablet; Refill: 0 - ondansetron (ZOFRAN) 4 MG tablet; Take 1 tablet (4 mg total) by mouth every 8 (eight) hours as needed for nausea or vomiting.  Dispense: 30 tablet; Refill: 0  2. Esophagitis, reflux - pantoprazole (PROTONIX) 40 MG tablet; Take 1 tablet (40 mg total) by mouth daily.  Dispense: 90 tablet; Refill: 3  3. Irritable bowel syndrome with constipation - hyoscyamine (LEVSIN, ANASPAZ) 0.125 MG tablet; Take 1 tablet (0.125 mg total) by mouth every 4 (four) hours as needed.  Dispense: 120 tablet; Refill: 1   Bari EdwardLaura Camdin Hegner, MD [facility]  05/17/2016

## 2016-08-19 DIAGNOSIS — Z1231 Encounter for screening mammogram for malignant neoplasm of breast: Secondary | ICD-10-CM | POA: Diagnosis not present

## 2016-08-20 ENCOUNTER — Encounter: Payer: Self-pay | Admitting: Internal Medicine

## 2016-08-21 ENCOUNTER — Ambulatory Visit (INDEPENDENT_AMBULATORY_CARE_PROVIDER_SITE_OTHER): Payer: Medicare PPO | Admitting: Internal Medicine

## 2016-08-21 ENCOUNTER — Encounter: Payer: Self-pay | Admitting: Internal Medicine

## 2016-08-21 ENCOUNTER — Other Ambulatory Visit: Payer: Self-pay | Admitting: Internal Medicine

## 2016-08-21 VITALS — BP 134/82 | HR 82 | Ht 65.0 in | Wt 162.8 lb

## 2016-08-21 DIAGNOSIS — K2100 Gastro-esophageal reflux disease with esophagitis, without bleeding: Secondary | ICD-10-CM

## 2016-08-21 DIAGNOSIS — Z0000 Encounter for general adult medical examination without abnormal findings: Secondary | ICD-10-CM

## 2016-08-21 DIAGNOSIS — E782 Mixed hyperlipidemia: Secondary | ICD-10-CM | POA: Diagnosis not present

## 2016-08-21 DIAGNOSIS — I10 Essential (primary) hypertension: Secondary | ICD-10-CM

## 2016-08-21 DIAGNOSIS — N3281 Overactive bladder: Secondary | ICD-10-CM | POA: Insufficient documentation

## 2016-08-21 DIAGNOSIS — J4520 Mild intermittent asthma, uncomplicated: Secondary | ICD-10-CM | POA: Diagnosis not present

## 2016-08-21 LAB — POCT URINALYSIS DIPSTICK
Bilirubin, UA: NEGATIVE
Blood, UA: NEGATIVE
GLUCOSE UA: NEGATIVE
Ketones, UA: NEGATIVE
Leukocytes, UA: NEGATIVE
Nitrite, UA: NEGATIVE
Spec Grav, UA: 1.02 (ref 1.030–1.035)
UROBILINOGEN UA: 0.2 (ref ?–2.0)
pH, UA: 6 (ref 5.0–8.0)

## 2016-08-21 MED ORDER — ATORVASTATIN CALCIUM 40 MG PO TABS: 40.0000 mg | ORAL_TABLET | Freq: Every day | ORAL | 3 refills | Status: DC

## 2016-08-21 MED ORDER — SERTRALINE HCL 50 MG PO TABS
50.0000 mg | ORAL_TABLET | Freq: Every day | ORAL | 3 refills | Status: DC
Start: 2016-08-21 — End: 2017-11-11

## 2016-08-21 MED ORDER — TOLTERODINE TARTRATE ER 2 MG PO CP24: 2.0000 mg | ORAL_CAPSULE | Freq: Every day | ORAL | 5 refills | Status: DC

## 2016-08-21 MED ORDER — OXYBUTYNIN CHLORIDE ER 5 MG PO TB24: 5.0000 mg | ORAL_TABLET | Freq: Every day | ORAL | 5 refills | Status: DC

## 2016-08-21 MED ORDER — LISINOPRIL 20 MG PO TABS: 20.0000 mg | ORAL_TABLET | Freq: Every day | ORAL | 3 refills | Status: DC

## 2016-08-21 NOTE — Progress Notes (Signed)
Patient: Sarah Hernandez, Female    DOB: November 18, 1950, 66 y.o.   MRN: [id number] Visit Date: 08/21/2016  Today's Provider: Bari EdwardLaura Pierra Skora, MD   Chief Complaint  Patient presents with  . Medicare Wellness    No pap- no breast exam- just had normal mammo.  . Urinary Incontinence    Having more difficulty holding in urine. "I have to change underwear more often so urine is not smelled."   Subjective:    Annual wellness visit Sarah Hernandez is a 66 y.o. female who presents today for her Subsequent Annual Wellness Visit. She feels well. She reports exercising playing golf and walking daily. She reports she is sleeping well. She denies breast problems.  Recent mammogram was normal.  ----------------------------------------------------------- Hypertension  This is a chronic problem. The problem is unchanged. The problem is uncontrolled (at home readings are ~140). Pertinent negatives include no chest pain, headaches, palpitations or shortness of breath. Past treatments include ACE inhibitors.  Hyperlipidemia  This is a chronic problem. The problem is controlled. She has no history of diabetes or liver disease. Pertinent negatives include no chest pain or shortness of breath. Current antihyperlipidemic treatment includes statins. The current treatment provides significant improvement of lipids.  Asthma  There is no cough, shortness of breath or wheezing. This is a chronic problem. The problem occurs intermittently. Pertinent negatives include no chest pain, headaches or trouble swallowing. Her past medical history is significant for asthma.  Gastroesophageal Reflux  She reports no abdominal pain, no chest pain, no coughing or no wheezing. Pertinent negatives include no fatigue.  Over active bladder - has urgency and OAB as well as some leakage with cough or sneeze.  Not yet having to wear a pad. She would like to try medication.   Review of Systems  Constitutional: Negative for chills, fatigue  and unexpected weight change.  HENT: Negative for congestion, hearing loss, tinnitus, trouble swallowing and voice change.   Eyes: Negative for visual disturbance.  Respiratory: Negative for cough, chest tightness, shortness of breath and wheezing.   Cardiovascular: Negative for chest pain, palpitations and leg swelling.  Gastrointestinal: Positive for abdominal distention (intermittent IBS). Negative for abdominal pain, constipation and diarrhea.  Endocrine: Negative for polydipsia and polyuria.  Genitourinary: Positive for urgency. Negative for dysuria, frequency, genital sores, vaginal bleeding and vaginal discharge.  Musculoskeletal: Negative for arthralgias, gait problem and joint swelling.  Skin: Negative for color change and rash.  Allergic/Immunologic: Positive for environmental allergies.  Neurological: Negative for dizziness, tremors, light-headedness and headaches.  Hematological: Negative for adenopathy. Does not bruise/bleed easily.  Psychiatric/Behavioral: Negative for dysphoric mood and sleep disturbance. The patient is not nervous/anxious.     Social History   Social History  . Marital status: Married    Spouse name: N/A  . Number of children: N/A  . Years of education: N/A   Occupational History  . Not on file.   Social History Main Topics  . Smoking status: Never Smoker  . Smokeless tobacco: Never Used  . Alcohol use 4.2 oz/week    7 Standard drinks or equivalent per week     Comment: occasional  . Drug use: No  . Sexual activity: Not on file   Other Topics Concern  . Not on file   Social History Narrative  . No narrative on file    Patient Active Problem List   Diagnosis Date Noted  . Esophagitis, reflux 10/05/2014  . Hemorrhoids, internal 10/05/2014  . Hyperlipidemia, mixed 10/05/2014  .  Benign hypertension 10/05/2014  . Irritable bowel syndrome with constipation 10/05/2014  . Mild intermittent asthma without complication 10/05/2014  . Allergic  rhinitis, seasonal 10/05/2014  . DD (diverticular disease) 12/13/2013    Past Surgical History:  Procedure Laterality Date  . COLONOSCOPY  2015   diverticulosis  . PARTIAL HYSTERECTOMY  1979   bleeding from fibroids  . TONSILLECTOMY  1966  . UPPER GASTROINTESTINAL ENDOSCOPY  09/2014   gastritis/esoph; no Barrett's    Her family history includes CAD in her brother and father; Dementia in her mother; Stroke in her brother.     Previous Medications   ALBUTEROL (PROVENTIL HFA;VENTOLIN HFA) 108 (90 BASE) MCG/ACT INHALER    Inhale 2 puffs into the lungs 4 (four) times daily as needed.   ALBUTEROL (PROVENTIL) (2.5 MG/3ML) 0.083% NEBULIZER SOLUTION    Inhale 3 mLs into the lungs 4 (four) times daily as needed. Reported on 08/16/2015   HYOSCYAMINE (LEVSIN, ANASPAZ) 0.125 MG TABLET    Take 1 tablet (0.125 mg total) by mouth every 4 (four) hours as needed.   PANTOPRAZOLE (PROTONIX) 40 MG TABLET    Take 1 tablet (40 mg total) by mouth daily.   Fall Risk  08/21/2016 08/16/2015  Falls in the past year? No No   Depression screen [facility] 2/9 08/21/2016 08/16/2015  Decreased Interest 0 0  Down, Depressed, Hopeless 0 0  PHQ - 2 Score 0 0   6CIT Screen 08/21/2016  What Year? 0 points  What month? 0 points  What time? 0 points  Count back from 20 0 points  Months in reverse 0 points  Repeat phrase 0 points  Total Score 0    Functional Status Survey: Is the patient deaf or have difficulty hearing?: No Does the patient have difficulty seeing, even when wearing glasses/contacts?: No Does the patient have difficulty concentrating, remembering, or making decisions?: No Does the patient have difficulty walking or climbing stairs?: No Does the patient have difficulty dressing or bathing?: No Does the patient have difficulty doing errands alone such as visiting a doctor's office or shopping?: No    Patient Care Team: Reubin Milan, MD as PCP - General (Family Medicine)      Objective:   Vitals:  BP 134/82   Pulse 82   Ht 5\' 5"  (1.651 m)   Wt 162 lb 12.8 oz (73.8 kg)   BMI 27.09 kg/m   Physical Exam  Constitutional: She is oriented to person, place, and time. She appears well-developed and well-nourished. No distress.  HENT:  Head: Normocephalic and atraumatic.  Right Ear: Tympanic membrane and ear canal normal.  Left Ear: Tympanic membrane and ear canal normal.  Nose: Right sinus exhibits no maxillary sinus tenderness. Left sinus exhibits no maxillary sinus tenderness.  Mouth/Throat: Uvula is midline and oropharynx is clear and moist.  Eyes: Conjunctivae and EOM are normal. Right eye exhibits no discharge. Left eye exhibits no discharge. No scleral icterus.  Neck: Normal range of motion. Carotid bruit is not present. No erythema present. No thyromegaly present.  Cardiovascular: Normal rate, regular rhythm, normal heart sounds and normal pulses.   Pulmonary/Chest: Effort normal. No respiratory distress. She has no wheezes.  Abdominal: Soft. Bowel sounds are normal. There is no hepatosplenomegaly. There is no tenderness. There is no CVA tenderness.  Musculoskeletal: Normal range of motion.  Lymphadenopathy:    She has no cervical adenopathy.    She has no axillary adenopathy.  Neurological: She is alert and oriented to person, place, and  time. She has normal reflexes. No cranial nerve deficit or sensory deficit.  Skin: Skin is warm, dry and intact. No rash noted.  Psychiatric: She has a normal mood and affect. Her speech is normal and behavior is normal. Thought content normal.  Nursing note and vitals reviewed.     Medicare Annual Wellness Visit Summary:  Reviewed patient's Family Medical History Reviewed and updated list of patient's medical providers Assessment of cognitive impairment was done Assessed patient's functional ability Established a written schedule for health screening services Health Risk Assessent Completed and Reviewed  Exercise Activities and  Dietary recommendations Goals    None      Immunization History  Administered Date(s) Administered  . Influenza,inj,Quad PF,36+ Mos 03/08/2015, 02/14/2016  . Pneumococcal Conjugate-13 08/10/2014  . Pneumococcal Polysaccharide-23 06/15/2013  . Tdap 05/28/2008  . Zoster 05/28/2012    Health Maintenance  Topic Date Due  . MAMMOGRAM  08/16/2017  . TETANUS/TDAP  05/28/2018  . COLONOSCOPY  10/20/2023  . INFLUENZA VACCINE  Completed  . DEXA SCAN  Addressed  . Hepatitis C Screening  Completed  . PNA vac Low Risk Adult  Completed    Discussed health benefits of physical activity, and encouraged her to engage in regular exercise appropriate for her age and condition.    ------------------------------------------------------------------------------------------------------------  Assessment & Plan:   1. Medicare annual wellness visit, subsequent Measures satisfied - POCT urinalysis dipstick  2. Benign hypertension controlled - Comprehensive metabolic panel - TSH  3. Mild intermittent asthma without complication stable  4. Esophagitis, reflux Doing well on PPI - CBC with Differential/Platelet  5. Hyperlipidemia, mixed On statin therapy - Lipid panel - atorvastatin (LIPITOR) 40 MG tablet; Take 1 tablet (40 mg total) by mouth at bedtime.  Dispense: 90 tablet; Refill: 3  6. OAB (overactive bladder) Begin Detrol LA 2mg  - can increase if needed  Meds ordered this encounter  Medications  . atorvastatin (LIPITOR) 40 MG tablet    Sig: Take 1 tablet (40 mg total) by mouth at bedtime.    Dispense:  90 tablet    Refill:  3  . lisinopril (PRINIVIL,ZESTRIL) 20 MG tablet    Sig: Take 1 tablet (20 mg total) by mouth daily.    Dispense:  90 tablet    Refill:  3  . sertraline (ZOLOFT) 50 MG tablet    Sig: Take 1 tablet (50 mg total) by mouth daily.    Dispense:  90 tablet    Refill:  3  . tolterodine (DETROL LA) 2 MG 24 hr capsule    Sig: Take 1 capsule (2 mg total) by mouth  daily.    Dispense:  30 capsule    Refill:  5    Bari Edward, MD [facility]  08/21/2016

## 2016-08-21 NOTE — Patient Instructions (Signed)
Health Maintenance  Topic Date Due  . MAMMOGRAM  08/16/2017  . TETANUS/TDAP  05/28/2018  . COLONOSCOPY  10/20/2023  . INFLUENZA VACCINE  Completed  . DEXA SCAN  Addressed  . Hepatitis C Screening  Completed  . PNA vac Low Risk Adult  Completed

## 2016-08-22 ENCOUNTER — Telehealth: Payer: Self-pay

## 2016-08-22 LAB — LIPID PANEL
CHOLESTEROL TOTAL: 173 mg/dL (ref 100–199)
Chol/HDL Ratio: 3.5 ratio units (ref 0.0–4.4)
HDL: 50 mg/dL (ref >39)
LDL Calculated: 96 mg/dL (ref 0–99)
TRIGLYCERIDES: 136 mg/dL (ref 0–149)
VLDL CHOLESTEROL CAL: 27 mg/dL (ref 5–40)

## 2016-08-22 LAB — CBC WITH DIFFERENTIAL/PLATELET
BASOS ABS: 0.1 10*3/uL (ref 0.0–0.2)
Basos: 1 %
EOS (ABSOLUTE): 0.2 10*3/uL (ref 0.0–0.4)
Eos: 3 %
Hematocrit: 36.5 % (ref 34.0–46.6)
Hemoglobin: 11.8 g/dL (ref 11.1–15.9)
IMMATURE GRANS (ABS): 0 10*3/uL (ref 0.0–0.1)
Immature Granulocytes: 0 %
LYMPHS: 32 %
Lymphocytes Absolute: 2 10*3/uL (ref 0.7–3.1)
MCH: 28 pg (ref 26.6–33.0)
MCHC: 32.3 g/dL (ref 31.5–35.7)
MCV: 87 fL (ref 79–97)
MONOS ABS: 0.4 10*3/uL (ref 0.1–0.9)
Monocytes: 6 %
NEUTROS ABS: 3.6 10*3/uL (ref 1.4–7.0)
Neutrophils: 58 %
PLATELETS: 316 10*3/uL (ref 150–379)
RBC: 4.22 x10E6/uL (ref 3.77–5.28)
RDW: 14.9 % (ref 12.3–15.4)
WBC: 6.3 10*3/uL (ref 3.4–10.8)

## 2016-08-22 LAB — COMPREHENSIVE METABOLIC PANEL
ALK PHOS: 101 IU/L (ref 39–117)
ALT: 18 IU/L (ref 0–32)
AST: 16 IU/L (ref 0–40)
Albumin/Globulin Ratio: 1.5 (ref 1.2–2.2)
Albumin: 4.1 g/dL (ref 3.6–4.8)
BILIRUBIN TOTAL: 0.5 mg/dL (ref 0.0–1.2)
BUN/Creatinine Ratio: 18 (ref 12–28)
BUN: 17 mg/dL (ref 8–27)
CHLORIDE: 106 mmol/L (ref 96–106)
CO2: 23 mmol/L (ref 18–29)
Calcium: 9.2 mg/dL (ref 8.7–10.3)
Creatinine, Ser: 0.92 mg/dL (ref 0.57–1.00)
GFR calc Af Amer: 74 mL/min/{1.73_m2} (ref >59)
GFR calc non Af Amer: 64 mL/min/{1.73_m2} (ref >59)
GLUCOSE: 97 mg/dL (ref 65–99)
Globulin, Total: 2.7 g/dL (ref 1.5–4.5)
POTASSIUM: 4.6 mmol/L (ref 3.5–5.2)
Sodium: 143 mmol/L (ref 134–144)
TOTAL PROTEIN: 6.8 g/dL (ref 6.0–8.5)

## 2016-08-22 LAB — TSH: TSH: 3.45 u[IU]/mL (ref 0.450–4.500)

## 2016-08-22 NOTE — Telephone Encounter (Signed)
[facility] sent fax stating pt is requesting different medication other than DETROL LA- Said it was too expensive for her to get.

## 2016-08-22 NOTE — Telephone Encounter (Signed)
Yes, I sent in a different Rx for ditropan.

## 2016-08-24 ENCOUNTER — Encounter: Payer: Self-pay | Admitting: Internal Medicine

## 2016-08-26 NOTE — Telephone Encounter (Signed)
Informed pt .

## 2016-09-14 ENCOUNTER — Other Ambulatory Visit: Payer: Self-pay | Admitting: Internal Medicine

## 2016-09-14 DIAGNOSIS — E782 Mixed hyperlipidemia: Secondary | ICD-10-CM

## 2016-10-28 DIAGNOSIS — S8011XA Contusion of right lower leg, initial encounter: Secondary | ICD-10-CM | POA: Diagnosis not present

## 2017-01-28 ENCOUNTER — Ambulatory Visit (INDEPENDENT_AMBULATORY_CARE_PROVIDER_SITE_OTHER): Payer: Medicare PPO

## 2017-01-28 DIAGNOSIS — Z23 Encounter for immunization: Secondary | ICD-10-CM | POA: Diagnosis not present

## 2017-05-09 ENCOUNTER — Other Ambulatory Visit: Payer: Self-pay | Admitting: Internal Medicine

## 2017-05-09 DIAGNOSIS — K2100 Gastro-esophageal reflux disease with esophagitis, without bleeding: Secondary | ICD-10-CM

## 2017-08-21 DIAGNOSIS — Z1231 Encounter for screening mammogram for malignant neoplasm of breast: Secondary | ICD-10-CM | POA: Diagnosis not present

## 2017-08-27 ENCOUNTER — Ambulatory Visit (INDEPENDENT_AMBULATORY_CARE_PROVIDER_SITE_OTHER): Payer: Medicare PPO

## 2017-08-27 VITALS — BP 130/60 | HR 74 | Temp 98.3°F | Resp 12 | Ht 65.0 in | Wt 159.6 lb

## 2017-08-27 DIAGNOSIS — Z0000 Encounter for general adult medical examination without abnormal findings: Secondary | ICD-10-CM

## 2017-08-27 NOTE — Progress Notes (Signed)
Subjective:   Sarah Hernandez is a 67 y.o. female who presents for Medicare Annual (Subsequent) preventive examination.  Review of Systems:  N/A Cardiac Risk Factors include: advanced age (>16men, >59 women);dyslipidemia;hypertension     Objective:     Vitals: BP 130/60 (BP Location: Right Arm, Patient Position: Sitting, Cuff Size: Normal)   Pulse 74   Temp 98.3 F (36.8 C) (Oral)   Resp 12   Ht 5\' 5"  (1.651 m)   Wt 159 lb 9.6 oz (72.4 kg)   SpO2 97%   BMI 26.56 kg/m   Body mass index is 26.56 kg/m.  Advanced Directives 08/27/2017 08/21/2016 08/16/2015  Does Patient Have a Medical Advance Directive? Yes Yes Yes  Type of Estate agent of [place];Living will Living will Healthcare Power of [place];Living will  Copy of Healthcare Power of Attorney in Chart? No - copy requested - -    Tobacco Social History   Tobacco Use  Smoking Status Never Smoker  Smokeless Tobacco Never Used  Tobacco Comment   smoking cessation materials not required     Counseling given: No Comment: smoking cessation materials not required   Clinical Intake:  Pre-visit preparation completed: Yes  Pain : No/denies pain   BMI - recorded: 26.56 Nutritional Status: BMI 25 -29 Overweight Nutritional Risks: None Diabetes: No  How often do you need to have someone help you when you read instructions, pamphlets, or other written materials from your doctor or pharmacy?: 1 - Never  Interpreter Needed?: No  Information entered by :: AEversole, LPN  Past Medical History:  Diagnosis Date  . Diverticulosis   . GERD (gastroesophageal reflux disease)   . Hyperlipidemia   . Hypertension   . Irritable bowel   . Overactive bladder    Past Surgical History:  Procedure Laterality Date  . COLONOSCOPY  2015   diverticulosis  . PARTIAL HYSTERECTOMY  1979   bleeding from fibroids  . TONSILLECTOMY  1966  . UPPER GASTROINTESTINAL ENDOSCOPY  09/2014   gastritis/esoph; no  Barrett's   Family History  Problem Relation Age of Onset  . Dementia Mother   . CAD Father   . CAD Brother   . Stroke Brother    Social History   Socioeconomic History  . Marital status: Married    Spouse name: Not on file  . Number of children: 2  . Years of education: some college  . Highest education level: 12th grade  Occupational History  . Occupation: Retired  Engineer, production  . Financial resource strain: Not hard at all  . Food insecurity:    Worry: Never true    Inability: Never true  . Transportation needs:    Medical: No    Non-medical: No  Tobacco Use  . Smoking status: Never Smoker  . Smokeless tobacco: Never Used  . Tobacco comment: smoking cessation materials not required  Substance and Sexual Activity  . Alcohol use: Yes    Alcohol/week: 1.2 oz    Types: 2 Standard drinks or equivalent per week    Comment: occasional  . Drug use: No  . Sexual activity: Not on file  Lifestyle  . Physical activity:    Days per week: 7 days    Minutes per session: 40 min  . Stress: Not at all  Relationships  . Social connections:    Talks on phone: Patient refused    Gets together: Patient refused    Attends religious service: Patient refused    Active member  of club or organization: Patient refused    Attends meetings of clubs or organizations: Patient refused    Relationship status: Married  Other Topics Concern  . Not on file  Social History Narrative  . Not on file    Outpatient Encounter Medications as of 08/27/2017  Medication Sig  . atorvastatin (LIPITOR) 40 MG tablet TAKE 1 TABLET (40 MG TOTAL) BY MOUTH AT BEDTIME.  Marland Kitchen. lisinopril (PRINIVIL,ZESTRIL) 20 MG tablet Take 1 tablet (20 mg total) by mouth daily.  . pantoprazole (PROTONIX) 40 MG tablet TAKE 1 TABLET (40 MG TOTAL) BY MOUTH DAILY.  Marland Kitchen. sertraline (ZOLOFT) 50 MG tablet Take 1 tablet (50 mg total) by mouth daily.  . [DISCONTINUED] albuterol (PROVENTIL HFA;VENTOLIN HFA) 108 (90 Base) MCG/ACT inhaler  Inhale 2 puffs into the lungs 4 (four) times daily as needed.  Marland Kitchen. albuterol (PROVENTIL) (2.5 MG/3ML) 0.083% nebulizer solution Inhale 3 mLs into the lungs 4 (four) times daily as needed. Reported on 08/16/2015  . hyoscyamine (LEVSIN, ANASPAZ) 0.125 MG tablet Take 1 tablet (0.125 mg total) by mouth every 4 (four) hours as needed. (Patient not taking: Reported on 08/27/2017)  . [DISCONTINUED] atorvastatin (LIPITOR) 40 MG tablet Take 1 tablet (40 mg total) by mouth at bedtime.  . [DISCONTINUED] oxybutynin (DITROPAN-XL) 5 MG 24 hr tablet Take 1 tablet (5 mg total) by mouth at bedtime.   No facility-administered encounter medications on file as of 08/27/2017.     Activities of Daily Living In your present state of health, do you have any difficulty performing the following activities: 08/27/2017  Hearing? N  Comment denies hearing aids  Vision? N  Comment wears eyeglasses  Difficulty concentrating or making decisions? N  Walking or climbing stairs? N  Dressing or bathing? N  Doing errands, shopping? N  Preparing Food and eating ? N  Comment denies dentures  Using the Toilet? N  In the past six months, have you accidently leaked urine? Y  Comment stress incontinence  Do you have problems with loss of bowel control? N  Managing your Medications? N  Managing your Finances? N  Housekeeping or managing your Housekeeping? N  Some recent data might be hidden    Patient Care Team: Reubin MilanBerglund, Laura H, MD as PCP - General (Family Medicine)    Assessment:   This is a routine wellness examination for [place].  Exercise Activities and Dietary recommendations Current Exercise Habits: Home exercise routine, Type of exercise: walking;Other - see comments(golf), Time (Minutes): 40, Frequency (Times/Week): 7, Weekly Exercise (Minutes/Week): 280, Intensity: Mild, Exercise limited by: None identified  Goals    . DIET - INCREASE WATER INTAKE     Recommend to drink at least 6-8 8oz glasses of water per day.        Fall Risk Fall Risk  08/27/2017 08/21/2016 08/16/2015  Falls in the past year? No No No  Risk for fall due to : Impaired vision;History of fall(s) - -  Risk for fall due to: Comment has fallen many times in the past due to "poor choices"; wears eyeglasses - -   Is the home free of loose throw rugs in walkways, pet beds, electrical cords, etc? Yes Adequate lighting to reduce risk of falls?  Yes In addition, does the patient have any of the following: Stairs in or around the home WITH handrails? Yes Grab bars in the bathroom? No  Shower chair or a place to sit while bathing? No Use of a cane, walker or w/c? No Use of an elevated  toilet seat or a handicapped toilet? No  Timed Get Up and Go Performed: Yes. Pt ambulated 10 feet within 5 sec. Gait stead-fast and without the use of an assistive device. No intervention required at this time. Fall risk prevention has been discussed.  Pt declined my offer to send Community Resource Referral to Care Guide for installation of grab bars in the shower, shower chair or an elevated toilet seat.  Depression Screen PHQ 2/9 Scores 08/27/2017 08/21/2016 08/16/2015  PHQ - 2 Score 0 0 0  PHQ- 9 Score 0 - -     Cognitive Function     6CIT Screen 08/27/2017 08/21/2016  What Year? 0 points 0 points  What month? 0 points 0 points  What time? 0 points 0 points  Count back from 20 0 points 0 points  Months in reverse 0 points 0 points  Repeat phrase 0 points 0 points  Total Score 0 0    Immunization History  Administered Date(s) Administered  . Influenza,inj,Quad PF,6+ Mos 03/08/2015, 02/14/2016, 01/28/2017  . Pneumococcal Conjugate-13 08/10/2014  . Pneumococcal Polysaccharide-23 06/15/2013  . Tdap 05/28/2008  . Zoster 05/28/2012    Qualifies for Shingles Vaccine? Yes. Zostavax completed 05/28/12. Due for Shingrix vaccine. Education has been provided regarding the importance of this vaccine. Pt has been advised to call her insurance company to determine  her out of pocket expense. Advised she may also receive this vaccine at her local pharmacy or Health Dept. Pt states she recently checked with insurance about receiving this vaccine and was told her out of pocket expense would be $175.00/syringe. Pt states vaccine is too costly and does not intend to receive this vaccine.  Screening Tests Health Maintenance  Topic Date Due  . INFLUENZA VACCINE  12/25/2017  . TETANUS/TDAP  05/28/2018  . MAMMOGRAM  08/22/2018  . COLONOSCOPY  10/20/2023  . Hepatitis C Screening  Completed  . PNA vac Low Risk Adult  Completed  . DEXA SCAN  Addressed    Cancer Screenings: Lung: Low Dose CT Chest recommended if Age 71-80 years, 30 pack-year currently smoking OR have quit w/in 15years. Patient does not qualify. Breast:  Up to date on Mammogram? Yes. Completed 08/21/17. Repeat every year.  Up to date of Bone Density/Dexa? Yes. Completed 06/29/13. Osteoporotic screenings no longer required. Colorectal: Completed colonoscopy 10/19/13. Repeat every 10 years.  Additional Screenings: Hepatitis C Screening: Completed 08/16/15    Plan:  I have personally reviewed and addressed the Medicare Annual Wellness questionnaire and have noted the following in the patient's chart:  A. Medical and social history B. Use of alcohol, tobacco or illicit drugs  C. Current medications and supplements D. Functional ability and status E.  Nutritional status F.  Physical activity G. Advance directives H. List of other physicians I.  Hospitalizations, surgeries, and ER visits in previous 12 months J.  Vitals K. Screenings such as hearing and vision if needed, cognitive and depression L. Referrals and appointments  In addition, I have reviewed and discussed with patient certain preventive protocols, quality metrics, and best practice recommendations. A written personalized care plan for preventive services as well as general preventive health recommendations were provided to  patient.  Signed,  Deon Pilling, LPN Nurse Health Advisor  MD Recommendations: Zostavax completed 05/28/12. Due for Shingrix vaccine. Education has been provided regarding the importance of this vaccine. Pt has been advised to call her insurance company to determine her out of pocket expense. Advised she may also receive this vaccine at her  local pharmacy or Health Dept. Pt states she recently checked with insurance about receiving this vaccine and was told her out of pocket expense would be $175.00/syringe. Pt states vaccine is too costly and does not intend to receive this vaccine.

## 2017-08-27 NOTE — Patient Instructions (Signed)
Sarah Hernandez , Thank you for taking time to come for your Medicare Wellness Visit. I appreciate your ongoing commitment to your health goals. Please review the following plan we discussed and let me know if I can assist you in the future.   Screening recommendations/referrals: Colorectal Screening: Completed colonoscopy 10/19/13. Repeat every 10 years Mammogram: Completed 08/21/17. Repeat every year.   Bone Density: Completed 06/29/13. Osteoporotic screenings no longer required.  Vision and Dental Exams: Recommended annual ophthalmology exams for early detection of glaucoma and other disorders of the eye Recommended annual dental exams for proper oral hygiene  Vaccinations: Influenza vaccine: Up to date Pneumococcal vaccine: Completed series Tdap vaccine: Up to date Shingles vaccine: Please call your insurance company to determine your out of pocket expense for the Shingrix vaccine. You may also receive this vaccine at your local pharmacy or Health Dept.  Advanced directives: Please bring a copy of your POA (Power of Attorney) and/or Living Will to your next appointment.  Conditions/risks identified: Recommend to drink at least 6-8 8oz glasses of water per day.  Next appointment: Please schedule your Annual Wellness Visit with your Nurse Health Advisor in one year.  Preventive Care 69 Years and Older, Female Preventive care refers to lifestyle choices and visits with your health care provider that can promote health and wellness. What does preventive care include?  A yearly physical exam. This is also called an annual well check.  Dental exams once or twice a year.  Routine eye exams. Ask your health care provider how often you should have your eyes checked.  Personal lifestyle choices, including:  Daily care of your teeth and gums.  Regular physical activity.  Eating a healthy diet.  Avoiding tobacco and drug use.  Limiting alcohol use.  Practicing safe sex.  Taking  low-dose aspirin every day.  Taking vitamin and mineral supplements as recommended by your health care provider. What happens during an annual well check? The services and screenings done by your health care provider during your annual well check will depend on your age, overall health, lifestyle risk factors, and family history of disease. Counseling  Your health care provider may ask you questions about your:  Alcohol use.  Tobacco use.  Drug use.  Emotional well-being.  Home and relationship well-being.  Sexual activity.  Eating habits.  History of falls.  Memory and ability to understand (cognition).  Work and work Astronomer.  Reproductive health. Screening  You may have the following tests or measurements:  Height, weight, and BMI.  Blood pressure.  Lipid and cholesterol levels. These may be checked every 5 years, or more frequently if you are over 34 years old.  Skin check.  Lung cancer screening. You may have this screening every year starting at age 60 if you have a 30-pack-year history of smoking and currently smoke or have quit within the past 15 years.  Fecal occult blood test (FOBT) of the stool. You may have this test every year starting at age 17.  Flexible sigmoidoscopy or colonoscopy. You may have a sigmoidoscopy every 5 years or a colonoscopy every 10 years starting at age 46.  Hepatitis C blood test.  Hepatitis B blood test.  Sexually transmitted disease (STD) testing.  Diabetes screening. This is done by checking your blood sugar (glucose) after you have not eaten for a while (fasting). You may have this done every 1-3 years.  Bone density scan. This is done to screen for osteoporosis. You may have this done starting at  age 69.  Mammogram. This may be done every 1-2 years. Talk to your health care provider about how often you should have regular mammograms. Talk with your health care provider about your test results, treatment options, and  if necessary, the need for more tests. Vaccines  Your health care provider may recommend certain vaccines, such as:  Influenza vaccine. This is recommended every year.  Tetanus, diphtheria, and acellular pertussis (Tdap, Td) vaccine. You may need a Td booster every 10 years.  Zoster vaccine. You may need this after age 69.  Pneumococcal 13-valent conjugate (PCV13) vaccine. One dose is recommended after age 69.  Pneumococcal polysaccharide (PPSV23) vaccine. One dose is recommended after age 69. Talk to your health care provider about which screenings and vaccines you need and how often you need them. This information is not intended to replace advice given to you by your health care provider. Make sure you discuss any questions you have with your health care provider. Document Released: 06/09/2015 Document Revised: 01/31/2016 Document Reviewed: 03/14/2015 Elsevier Interactive Patient Education  2017 [employer]  Fall Prevention in the Home Falls can cause injuries. They can happen to people of all ages. There are many things you can do to make your home safe and to help prevent falls. What can I do on the outside of my home?  Regularly fix the edges of walkways and driveways and fix any cracks.  Remove anything that might make you trip as you walk through a door, such as a raised step or threshold.  Trim any bushes or trees on the path to your home.  Use bright outdoor lighting.  Clear any walking paths of anything that might make someone trip, such as rocks or tools.  Regularly check to see if handrails are loose or broken. Make sure that both sides of any steps have handrails.  Any raised decks and porches should have guardrails on the edges.  Have any leaves, snow, or ice cleared regularly.  Use sand or salt on walking paths during winter.  Clean up any spills in your garage right away. This includes oil or grease spills. What can I do in the bathroom?  Use night  lights.  Install grab bars by the toilet and in the tub and shower. Do not use towel bars as grab bars.  Use non-skid mats or decals in the tub or shower.  If you need to sit down in the shower, use a plastic, non-slip stool.  Keep the floor dry. Clean up any water that spills on the floor as soon as it happens.  Remove soap buildup in the tub or shower regularly.  Attach bath mats securely with double-sided non-slip rug tape.  Do not have throw rugs and other things on the floor that can make you trip. What can I do in the bedroom?  Use night lights.  Make sure that you have a light by your bed that is easy to reach.  Do not use any sheets or blankets that are too big for your bed. They should not hang down onto the floor.  Have a firm chair that has side arms. You can use this for support while you get dressed.  Do not have throw rugs and other things on the floor that can make you trip. What can I do in the kitchen?  Clean up any spills right away.  Avoid walking on wet floors.  Keep items that you use a lot in easy-to-reach places.  If you  need to reach something above you, use a strong step stool that has a grab bar.  Keep electrical cords out of the way.  Do not use floor polish or wax that makes floors slippery. If you must use wax, use non-skid floor wax.  Do not have throw rugs and other things on the floor that can make you trip. What can I do with my stairs?  Do not leave any items on the stairs.  Make sure that there are handrails on both sides of the stairs and use them. Fix handrails that are broken or loose. Make sure that handrails are as long as the stairways.  Check any carpeting to make sure that it is firmly attached to the stairs. Fix any carpet that is loose or worn.  Avoid having throw rugs at the top or bottom of the stairs. If you do have throw rugs, attach them to the floor with carpet tape.  Make sure that you have a light switch at the  top of the stairs and the bottom of the stairs. If you do not have them, ask someone to add them for you. What else can I do to help prevent falls?  Wear shoes that:  Do not have high heels.  Have rubber bottoms.  Are comfortable and fit you well.  Are closed at the toe. Do not wear sandals.  If you use a stepladder:  Make sure that it is fully opened. Do not climb a closed stepladder.  Make sure that both sides of the stepladder are locked into place.  Ask someone to hold it for you, if possible.  Clearly mark and make sure that you can see:  Any grab bars or handrails.  First and last steps.  Where the edge of each step is.  Use tools that help you move around (mobility aids) if they are needed. These include:  Canes.  Walkers.  Scooters.  Crutches.  Turn on the lights when you go into a dark area. Replace any light bulbs as soon as they burn out.  Set up your furniture so you have a clear path. Avoid moving your furniture around.  If any of your floors are uneven, fix them.  If there are any pets around you, be aware of where they are.  Review your medicines with your doctor. Some medicines can make you feel dizzy. This can increase your chance of falling. Ask your doctor what other things that you can do to help prevent falls. This information is not intended to replace advice given to you by your health care provider. Make sure you discuss any questions you have with your health care provider. Document Released: 03/09/2009 Document Revised: 10/19/2015 Document Reviewed: 06/17/2014 Elsevier Interactive Patient Education  2017 [employer].

## 2017-09-03 ENCOUNTER — Encounter: Payer: Self-pay | Admitting: Internal Medicine

## 2017-09-03 ENCOUNTER — Ambulatory Visit (INDEPENDENT_AMBULATORY_CARE_PROVIDER_SITE_OTHER): Payer: Medicare PPO | Admitting: Internal Medicine

## 2017-09-03 VITALS — BP 130/80 | HR 74 | Ht 65.0 in | Wt 158.0 lb

## 2017-09-03 DIAGNOSIS — J4520 Mild intermittent asthma, uncomplicated: Secondary | ICD-10-CM | POA: Diagnosis not present

## 2017-09-03 DIAGNOSIS — Z0000 Encounter for general adult medical examination without abnormal findings: Secondary | ICD-10-CM

## 2017-09-03 DIAGNOSIS — K2100 Gastro-esophageal reflux disease with esophagitis, without bleeding: Secondary | ICD-10-CM

## 2017-09-03 DIAGNOSIS — I10 Essential (primary) hypertension: Secondary | ICD-10-CM

## 2017-09-03 DIAGNOSIS — E782 Mixed hyperlipidemia: Secondary | ICD-10-CM

## 2017-09-03 DIAGNOSIS — E2839 Other primary ovarian failure: Secondary | ICD-10-CM

## 2017-09-03 DIAGNOSIS — Z0001 Encounter for general adult medical examination with abnormal findings: Secondary | ICD-10-CM | POA: Diagnosis not present

## 2017-09-03 DIAGNOSIS — K5790 Diverticulosis of intestine, part unspecified, without perforation or abscess without bleeding: Secondary | ICD-10-CM

## 2017-09-03 LAB — POCT URINALYSIS DIPSTICK
BILIRUBIN UA: NEGATIVE
GLUCOSE UA: NEGATIVE
Ketones, UA: NEGATIVE
Leukocytes, UA: NEGATIVE
Nitrite, UA: NEGATIVE
PH UA: 6.5 (ref 5.0–8.0)
Protein, UA: NEGATIVE
RBC UA: NEGATIVE
SPEC GRAV UA: 1.02 (ref 1.010–1.025)
Urobilinogen, UA: 0.2 E.U./dL

## 2017-09-03 NOTE — Progress Notes (Signed)
Date:  09/03/2017   Name:  Sarah Hernandez   DOB:  March 30, 1951   MRN:  [id number]   Chief Complaint: Annual Exam (Breast Exam. ) Sarah Hernandez is a 67 y.o. female who presents today for her Complete Annual Exam. She feels well. She reports exercising walking regularly. She reports she is sleeping well. She denies breast problems.  Recent mammogram was normal. Colonoscopy done in 2015.  Hypertension  This is a chronic problem. The problem is unchanged. The problem is controlled. Pertinent negatives include no chest pain, headaches, palpitations or shortness of breath.  Hyperlipidemia  This is a chronic problem. The problem is controlled. Pertinent negatives include no chest pain or shortness of breath. Current antihyperlipidemic treatment includes statins.  Gastroesophageal Reflux  She complains of heartburn. She reports no abdominal pain, no chest pain, no coughing or no wheezing. The problem occurs occasionally. Pertinent negatives include no fatigue. She has tried a PPI for the symptoms.  Asthma  There is no cough, shortness of breath or wheezing. The problem occurs rarely. Associated symptoms include heartburn. Pertinent negatives include no chest pain, fever, headaches or trouble swallowing. Her symptoms are alleviated by beta-agonist. Her past medical history is significant for asthma.    Review of Systems  Constitutional: Negative for chills, fatigue and fever.  HENT: Negative for congestion, hearing loss, tinnitus, trouble swallowing and voice change.   Eyes: Negative for visual disturbance.  Respiratory: Negative for cough, chest tightness, shortness of breath and wheezing.   Cardiovascular: Negative for chest pain, palpitations and leg swelling.  Gastrointestinal: Positive for heartburn. Negative for abdominal pain, constipation, diarrhea and vomiting.  Endocrine: Negative for polydipsia and polyuria.  Genitourinary: Negative for dysuria, frequency, genital sores, vaginal  bleeding and vaginal discharge.  Musculoskeletal: Negative for arthralgias, gait problem and joint swelling.  Skin: Negative for color change and rash.  Neurological: Negative for dizziness, tremors, light-headedness and headaches.  Hematological: Negative for adenopathy. Does not bruise/bleed easily.  Psychiatric/Behavioral: Negative for dysphoric mood and sleep disturbance. The patient is not nervous/anxious.     Patient Active Problem List   Diagnosis Date Noted  . OAB (overactive bladder) 08/21/2016  . Esophagitis, reflux 10/05/2014  . Hemorrhoids, internal 10/05/2014  . Hyperlipidemia, mixed 10/05/2014  . Benign hypertension 10/05/2014  . Irritable bowel syndrome with constipation 10/05/2014  . Mild intermittent asthma without complication 10/05/2014  . Allergic rhinitis, seasonal 10/05/2014  . DD (diverticular disease) 12/13/2013    Prior to Admission medications   Medication Sig Start Date End Date Taking? Authorizing Provider  albuterol (PROVENTIL) (2.5 MG/3ML) 0.083% nebulizer solution Inhale 3 mLs into the lungs 4 (four) times daily as needed. Reported on 08/16/2015   Yes [provider]  atorvastatin (LIPITOR) 40 MG tablet TAKE 1 TABLET (40 MG TOTAL) BY MOUTH AT BEDTIME. 09/14/16  Yes Reubin Milan, MD  hyoscyamine (LEVSIN, ANASPAZ) 0.125 MG tablet Take 1 tablet (0.125 mg total) by mouth every 4 (four) hours as needed. 05/17/16  Yes Reubin Milan, MD  lisinopril (PRINIVIL,ZESTRIL) 20 MG tablet Take 1 tablet (20 mg total) by mouth daily. 08/21/16  Yes Reubin Milan, MD  pantoprazole (PROTONIX) 40 MG tablet TAKE 1 TABLET (40 MG TOTAL) BY MOUTH DAILY. 05/09/17  Yes Reubin Milan, MD  sertraline (ZOLOFT) 50 MG tablet Take 1 tablet (50 mg total) by mouth daily. 08/21/16  Yes Reubin Milan, MD    No Known Allergies  Past Surgical History:  Procedure Laterality Date  .  COLONOSCOPY  2015   diverticulosis  . PARTIAL HYSTERECTOMY  1979   bleeding from  fibroids  . TONSILLECTOMY  1966  . UPPER GASTROINTESTINAL ENDOSCOPY  09/2014   gastritis/esoph; no Barrett's    Social History   Tobacco Use  . Smoking status: Never Smoker  . Smokeless tobacco: Never Used  . Tobacco comment: smoking cessation materials not required  Substance Use Topics  . Alcohol use: Yes    Alcohol/week: 1.2 oz    Types: 2 Standard drinks or equivalent per week    Comment: occasional  . Drug use: No     Medication list has been reviewed and updated.  PHQ 2/9 Scores 08/27/2017 08/21/2016 08/16/2015  PHQ - 2 Score 0 0 0  PHQ- 9 Score 0 - -    Physical Exam  Constitutional: She is oriented to person, place, and time. She appears well-developed and well-nourished. No distress.  HENT:  Head: Normocephalic and atraumatic.  Right Ear: Tympanic membrane and ear canal normal.  Left Ear: Tympanic membrane and ear canal normal.  Nose: Right sinus exhibits no maxillary sinus tenderness. Left sinus exhibits no maxillary sinus tenderness.  Mouth/Throat: Uvula is midline and oropharynx is clear and moist.  Eyes: Conjunctivae and EOM are normal. Right eye exhibits no discharge. Left eye exhibits no discharge. No scleral icterus.  Neck: Normal range of motion. Carotid bruit is not present. No erythema present. No thyromegaly present.  Cardiovascular: Normal rate, regular rhythm, normal heart sounds and normal pulses.  Pulmonary/Chest: Effort normal. No respiratory distress. She has no wheezes. Right breast exhibits no mass, no nipple discharge, no skin change and no tenderness. Left breast exhibits no mass, no nipple discharge, no skin change and no tenderness.  Abdominal: Soft. Bowel sounds are normal. There is no hepatosplenomegaly. There is no tenderness. There is no CVA tenderness.  Musculoskeletal: Normal range of motion. She exhibits no edema or tenderness.       Right knee: She exhibits normal range of motion, no swelling and no effusion.       Left knee: She exhibits  normal range of motion, no swelling and no effusion.  Mild crepitus without pain in both knees  Lymphadenopathy:    She has no cervical adenopathy.    She has no axillary adenopathy.  Neurological: She is alert and oriented to person, place, and time. She has normal reflexes. No cranial nerve deficit or sensory deficit.  Skin: Skin is warm, dry and intact. No rash noted.  Psychiatric: She has a normal mood and affect. Her speech is normal and behavior is normal. Thought content normal.  Nursing note and vitals reviewed.   BP 130/80   Pulse 74   Ht 5\' 5"  (1.651 m)   Wt 158 lb (71.7 kg)   SpO2 98%   BMI 26.29 kg/m   Assessment and Plan: 1. Annual physical exam Normal exam Continue exercise and healthy diet - POCT urinalysis dipstick  2. Hyperlipidemia, mixed On statin therapy - Lipid panel  3. Benign hypertension controlled - Comprehensive metabolic panel - TSH  4. Mild intermittent asthma without complication Stable without daily medication  5. Esophagitis, reflux Controlled on daily PPI - CBC with Differential/Platelet  6. Ovarian failure Schedule DEXA - DG Bone Density; Future   No orders of the defined types were placed in this encounter.   Partially dictated using Animal nutritionistDragon software. Any errors are unintentional.  Bari EdwardLaura Kamylah Manzo, MD [facility]  09/03/2017

## 2017-09-04 LAB — COMPREHENSIVE METABOLIC PANEL
A/G RATIO: 1.4 (ref 1.2–2.2)
ALT: 21 IU/L (ref 0–32)
AST: 13 IU/L (ref 0–40)
Albumin: 4.1 g/dL (ref 3.6–4.8)
Alkaline Phosphatase: 95 IU/L (ref 39–117)
BUN/Creatinine Ratio: 15 (ref 12–28)
BUN: 16 mg/dL (ref 8–27)
Bilirubin Total: 0.4 mg/dL (ref 0.0–1.2)
CALCIUM: 9.5 mg/dL (ref 8.7–10.3)
CO2: 22 mmol/L (ref 20–29)
CREATININE: 1.09 mg/dL — AB (ref 0.57–1.00)
Chloride: 105 mmol/L (ref 96–106)
GFR, EST AFRICAN AMERICAN: 60 mL/min/{1.73_m2} (ref >59)
GFR, EST NON AFRICAN AMERICAN: 52 mL/min/{1.73_m2} — AB (ref >59)
GLOBULIN, TOTAL: 2.9 g/dL (ref 1.5–4.5)
Glucose: 84 mg/dL (ref 65–99)
POTASSIUM: 4.6 mmol/L (ref 3.5–5.2)
SODIUM: 142 mmol/L (ref 134–144)
TOTAL PROTEIN: 7 g/dL (ref 6.0–8.5)

## 2017-09-04 LAB — CBC WITH DIFFERENTIAL/PLATELET
Basophils Absolute: 0.1 10*3/uL (ref 0.0–0.2)
Basos: 1 %
EOS (ABSOLUTE): 0.3 10*3/uL (ref 0.0–0.4)
EOS: 4 %
HEMATOCRIT: 37 % (ref 34.0–46.6)
Hemoglobin: 12.4 g/dL (ref 11.1–15.9)
IMMATURE GRANS (ABS): 0 10*3/uL (ref 0.0–0.1)
IMMATURE GRANULOCYTES: 0 %
LYMPHS: 35 %
Lymphocytes Absolute: 2.7 10*3/uL (ref 0.7–3.1)
MCH: 28.7 pg (ref 26.6–33.0)
MCHC: 33.5 g/dL (ref 31.5–35.7)
MCV: 86 fL (ref 79–97)
MONOCYTES: 7 %
Monocytes Absolute: 0.5 10*3/uL (ref 0.1–0.9)
NEUTROS PCT: 53 %
Neutrophils Absolute: 4 10*3/uL (ref 1.4–7.0)
Platelets: 305 10*3/uL (ref 150–379)
RBC: 4.32 x10E6/uL (ref 3.77–5.28)
RDW: 14.6 % (ref 12.3–15.4)
WBC: 7.6 10*3/uL (ref 3.4–10.8)

## 2017-09-04 LAB — LIPID PANEL
Chol/HDL Ratio: 3.8 ratio (ref 0.0–4.4)
Cholesterol, Total: 192 mg/dL (ref 100–199)
HDL: 50 mg/dL (ref >39)
LDL CALC: 107 mg/dL — AB (ref 0–99)
TRIGLYCERIDES: 175 mg/dL — AB (ref 0–149)
VLDL CHOLESTEROL CAL: 35 mg/dL (ref 5–40)

## 2017-09-04 LAB — TSH: TSH: 3.06 u[IU]/mL (ref 0.450–4.500)

## 2017-09-06 ENCOUNTER — Other Ambulatory Visit: Payer: Self-pay | Admitting: Internal Medicine

## 2017-09-06 DIAGNOSIS — E782 Mixed hyperlipidemia: Secondary | ICD-10-CM

## 2017-09-09 DIAGNOSIS — E2839 Other primary ovarian failure: Secondary | ICD-10-CM | POA: Diagnosis not present

## 2017-09-09 DIAGNOSIS — Z780 Asymptomatic menopausal state: Secondary | ICD-10-CM | POA: Diagnosis not present

## 2017-09-11 ENCOUNTER — Telehealth: Payer: Self-pay | Admitting: Internal Medicine

## 2017-09-11 NOTE — Telephone Encounter (Signed)
DEXA was normal

## 2017-09-12 NOTE — Telephone Encounter (Signed)
Patient informed bone density scan normal.

## 2017-11-01 ENCOUNTER — Other Ambulatory Visit: Payer: Self-pay | Admitting: Internal Medicine

## 2017-11-01 DIAGNOSIS — K2100 Gastro-esophageal reflux disease with esophagitis, without bleeding: Secondary | ICD-10-CM

## 2017-11-04 ENCOUNTER — Encounter: Payer: Self-pay | Admitting: Internal Medicine

## 2017-11-05 ENCOUNTER — Other Ambulatory Visit: Payer: Self-pay | Admitting: Internal Medicine

## 2017-11-05 MED ORDER — OMEPRAZOLE 20 MG PO CPDR: 20.0000 mg | DELAYED_RELEASE_CAPSULE | Freq: Every day | ORAL | 1 refills | Status: DC

## 2017-11-10 ENCOUNTER — Encounter: Payer: Self-pay | Admitting: Internal Medicine

## 2017-11-11 MED ORDER — SERTRALINE HCL 50 MG PO TABS: 50.0000 mg | ORAL_TABLET | Freq: Every day | ORAL | 3 refills | Status: DC

## 2017-12-08 ENCOUNTER — Encounter: Payer: Self-pay | Admitting: Internal Medicine

## 2017-12-13 ENCOUNTER — Encounter: Payer: Self-pay | Admitting: Internal Medicine

## 2017-12-15 ENCOUNTER — Other Ambulatory Visit: Payer: Self-pay

## 2017-12-15 MED ORDER — LISINOPRIL 20 MG PO TABS: 20.0000 mg | ORAL_TABLET | Freq: Every day | ORAL | 2 refills | Status: DC

## 2018-02-02 ENCOUNTER — Ambulatory Visit (INDEPENDENT_AMBULATORY_CARE_PROVIDER_SITE_OTHER): Payer: Medicare PPO

## 2018-02-02 DIAGNOSIS — Z23 Encounter for immunization: Secondary | ICD-10-CM | POA: Diagnosis not present

## 2018-02-04 ENCOUNTER — Ambulatory Visit: Payer: Medicare PPO

## 2018-04-16 ENCOUNTER — Ambulatory Visit (INDEPENDENT_AMBULATORY_CARE_PROVIDER_SITE_OTHER): Payer: Medicare PPO | Admitting: Internal Medicine

## 2018-04-16 ENCOUNTER — Encounter: Payer: Self-pay | Admitting: Internal Medicine

## 2018-04-16 VITALS — BP 130/84 | HR 73 | Ht 65.0 in | Wt 156.0 lb

## 2018-04-16 DIAGNOSIS — K5732 Diverticulitis of large intestine without perforation or abscess without bleeding: Secondary | ICD-10-CM | POA: Diagnosis not present

## 2018-04-16 MED ORDER — METRONIDAZOLE 500 MG PO TABS: 500.0000 mg | ORAL_TABLET | Freq: Three times a day (TID) | ORAL | 0 refills | Status: DC

## 2018-04-16 MED ORDER — PROMETHAZINE HCL 25 MG PO TABS: 25.0000 mg | ORAL_TABLET | Freq: Three times a day (TID) | ORAL | 0 refills | Status: DC | PRN

## 2018-04-16 MED ORDER — CIPROFLOXACIN HCL 500 MG PO TABS: 500.0000 mg | ORAL_TABLET | Freq: Two times a day (BID) | ORAL | 0 refills | Status: AC

## 2018-04-16 NOTE — Progress Notes (Deleted)
Acute Office Visit  Subjective:    Patient ID: Sarah Hernandez, female    DOB: [birth year], 67 y.o.   MRN: [id number]  No chief complaint on file.   Abdominal Pain  diverticulitis   Patient is in today for abdominal pain and suspected diverticulitis.  Past Medical History:  Diagnosis Date  . Diverticulosis   . GERD (gastroesophageal reflux disease)   . Hyperlipidemia   . Hypertension   . Irritable bowel   . Overactive bladder     Past Surgical History:  Procedure Laterality Date  . COLONOSCOPY  2015   diverticulosis  . PARTIAL HYSTERECTOMY  1979   bleeding from fibroids  . TONSILLECTOMY  1966  . UPPER GASTROINTESTINAL ENDOSCOPY  09/2014   gastritis/esoph; no Barrett's    Family History  Problem Relation Age of Onset  . Dementia Mother   . CAD Father   . CAD Brother   . Stroke Brother     Social History   Socioeconomic History  . Marital status: Married    Spouse name: Not on file  . Number of children: 2  . Years of education: some college  . Highest education level: 12th grade  Occupational History  . Occupation: Retired  Engineer, productionocial Needs  . Financial resource strain: Not hard at all  . Food insecurity:    Worry: Never true    Inability: Never true  . Transportation needs:    Medical: No    Non-medical: No  Tobacco Use  . Smoking status: Never Smoker  . Smokeless tobacco: Never Used  . Tobacco comment: smoking cessation materials not required  Substance and Sexual Activity  . Alcohol use: Yes    Alcohol/week: 2.0 standard drinks    Types: 2 Standard drinks or equivalent per week    Comment: occasional  . Drug use: No  . Sexual activity: Not on file  Lifestyle  . Physical activity:    Days per week: 7 days    Minutes per session: 40 min  . Stress: Not at all  Relationships  . Social connections:    Talks on phone: Patient refused    Gets together: Patient refused    Attends religious service: Patient refused    Active member of club or  organization: Patient refused    Attends meetings of clubs or organizations: Patient refused    Relationship status: Married  . Intimate partner violence:    Fear of current or ex partner: No    Emotionally abused: No    Physically abused: No    Forced sexual activity: No  Other Topics Concern  . Not on file  Social History Narrative  . Not on file    Outpatient Medications Prior to Visit  Medication Sig Dispense Refill  . albuterol (PROVENTIL) (2.5 MG/3ML) 0.083% nebulizer solution Inhale 3 mLs into the lungs 4 (four) times daily as needed. Reported on 08/16/2015    . atorvastatin (LIPITOR) 40 MG tablet TAKE 1 TABLET (40 MG TOTAL) BY MOUTH AT BEDTIME. 90 tablet 3  . hyoscyamine (LEVSIN, ANASPAZ) 0.125 MG tablet Take 1 tablet (0.125 mg total) by mouth every 4 (four) hours as needed. 120 tablet 1  . lisinopril (PRINIVIL,ZESTRIL) 20 MG tablet Take 1 tablet (20 mg total) by mouth daily. 90 tablet 2  . omeprazole (PRILOSEC) 20 MG capsule Take 1 capsule (20 mg total) by mouth daily. 90 capsule 1  . sertraline (ZOLOFT) 50 MG tablet Take 1 tablet (50 mg total) by mouth daily. 90  tablet 3   No facility-administered medications prior to visit.     No Known Allergies  Review of Systems  Gastrointestinal: Positive for abdominal pain.       Objective:    Physical Exam  There were no vitals taken for this visit. Wt Readings from Last 3 Encounters:  09/03/17 158 lb (71.7 kg)  08/27/17 159 lb 9.6 oz (72.4 kg)  08/21/16 162 lb 12.8 oz (73.8 kg)    There are no preventive care reminders to display for this patient.  There are no preventive care reminders to display for this patient.   Lab Results  Component Value Date   TSH 3.060 09/03/2017   Lab Results  Component Value Date   WBC 7.6 09/03/2017   HGB 12.4 09/03/2017   HCT 37.0 09/03/2017   MCV 86 09/03/2017   PLT 305 09/03/2017   Lab Results  Component Value Date   NA 142 09/03/2017   K 4.6 09/03/2017   CO2 22  09/03/2017   GLUCOSE 84 09/03/2017   BUN 16 09/03/2017   CREATININE 1.09 (H) 09/03/2017   BILITOT 0.4 09/03/2017   ALKPHOS 95 09/03/2017   AST 13 09/03/2017   ALT 21 09/03/2017   PROT 7.0 09/03/2017   ALBUMIN 4.1 09/03/2017   CALCIUM 9.5 09/03/2017   Lab Results  Component Value Date   CHOL 192 09/03/2017   Lab Results  Component Value Date   HDL 50 09/03/2017   Lab Results  Component Value Date   LDLCALC 107 (H) 09/03/2017   Lab Results  Component Value Date   TRIG 175 (H) 09/03/2017   Lab Results  Component Value Date   CHOLHDL 3.8 09/03/2017   No results found for: HGBA1C     Assessment & Plan:   Problem List Items Addressed This Visit    None       No orders of the defined types were placed in this encounter.    Bari Edward, MD

## 2018-04-16 NOTE — Patient Instructions (Signed)
Begin a probiotic capsule 1-2 times a day

## 2018-04-16 NOTE — Progress Notes (Signed)
Date:  04/16/2018   Name:  Sarah GamblesLinda G Anagnos   DOB:  March 09, 1951   MRN:  [id number]   Chief Complaint: Abdominal Pain (Feels like stress started it up. Flare up of diverticulitis. Started X 1 week. )  Abdominal Pain  This is a new problem. The current episode started in the past 7 days. The onset quality is gradual. The problem occurs daily. The problem has been gradually worsening. The pain is located in the LLQ. The pain is mild. The quality of the pain is colicky. The abdominal pain does not radiate. Associated symptoms include diarrhea, a fever and nausea. Pertinent negatives include no headaches or vomiting.    Review of Systems  Constitutional: Positive for fever.  Respiratory: Negative for chest tightness and shortness of breath.   Cardiovascular: Negative for chest pain.  Gastrointestinal: Positive for abdominal pain, diarrhea and nausea. Negative for blood in stool and vomiting.  Neurological: Negative for dizziness and headaches.    Patient Active Problem List   Diagnosis Date Noted  . OAB (overactive bladder) 08/21/2016  . Esophagitis, reflux 10/05/2014  . Hemorrhoids, internal 10/05/2014  . Hyperlipidemia, mixed 10/05/2014  . Benign hypertension 10/05/2014  . Irritable bowel syndrome with constipation 10/05/2014  . Mild intermittent asthma without complication 10/05/2014  . Allergic rhinitis, seasonal 10/05/2014  . DD (diverticular disease) 12/13/2013    No Known Allergies  Past Surgical History:  Procedure Laterality Date  . COLONOSCOPY  2015   diverticulosis  . PARTIAL HYSTERECTOMY  1979   bleeding from fibroids  . TONSILLECTOMY  1966  . UPPER GASTROINTESTINAL ENDOSCOPY  09/2014   gastritis/esoph; no Barrett's    Social History   Tobacco Use  . Smoking status: Never Smoker  . Smokeless tobacco: Never Used  . Tobacco comment: smoking cessation materials not required  Substance Use Topics  . Alcohol use: Yes    Alcohol/week: 2.0 standard drinks   Types: 2 Standard drinks or equivalent per week    Comment: occasional  . Drug use: No     Medication list has been reviewed and updated.  Current Meds  Medication Sig  . albuterol (PROVENTIL) (2.5 MG/3ML) 0.083% nebulizer solution Inhale 3 mLs into the lungs 4 (four) times daily as needed. Reported on 08/16/2015  . atorvastatin (LIPITOR) 40 MG tablet TAKE 1 TABLET (40 MG TOTAL) BY MOUTH AT BEDTIME.  . hyoscyamine (LEVSIN, ANASPAZ) 0.125 MG tablet Take 1 tablet (0.125 mg total) by mouth every 4 (four) hours as needed.  Marland Kitchen. lisinopril (PRINIVIL,ZESTRIL) 20 MG tablet Take 1 tablet (20 mg total) by mouth daily.  Marland Kitchen. omeprazole (PRILOSEC) 20 MG capsule Take 1 capsule (20 mg total) by mouth daily.  . sertraline (ZOLOFT) 50 MG tablet Take 1 tablet (50 mg total) by mouth daily.    PHQ 2/9 Scores 08/27/2017 08/21/2016 08/16/2015  PHQ - 2 Score 0 0 0  PHQ- 9 Score 0 - -    Physical Exam  Constitutional: She is oriented to person, place, and time. She appears well-developed and well-nourished. No distress.  HENT:  Head: Normocephalic and atraumatic.  Pulmonary/Chest: Effort normal. No respiratory distress.  Abdominal: Soft. Normal appearance. She exhibits no distension and no fluid wave. Bowel sounds are decreased. There is tenderness in the left lower quadrant. There is no rigidity and no guarding.  Musculoskeletal: Normal range of motion.  Neurological: She is alert and oriented to person, place, and time.  Skin: Skin is warm and dry. No rash noted.  Psychiatric: She has  a normal mood and affect. Her behavior is normal. Thought content normal.  Nursing note and vitals reviewed.   BP 130/84 (BP Location: Right Arm, Patient Position: Sitting, Cuff Size: Normal)   Pulse 73   Ht 5\' 5"  (1.651 m)   Wt 156 lb (70.8 kg)   SpO2 98%   BMI 25.96 kg/m   Assessment and Plan: 1. Diverticulitis of large intestine without perforation or abscess without bleeding Bland diet, fluids - ciprofloxacin  (CIPRO) 500 MG tablet; Take 1 tablet (500 mg total) by mouth 2 (two) times daily for 7 days.  Dispense: 14 tablet; Refill: 0 - metroNIDAZOLE (FLAGYL) 500 MG tablet; Take 1 tablet (500 mg total) by mouth 3 (three) times daily.  Dispense: 21 tablet; Refill: 0 - promethazine (PHENERGAN) 25 MG tablet; Take 1 tablet (25 mg total) by mouth every 8 (eight) hours as needed for nausea or vomiting.  Dispense: 20 tablet; Refill: 0   Partially dictated using Animal nutritionist. Any errors are unintentional.  Bari Edward, MD [facility]  04/16/2018

## 2018-05-05 ENCOUNTER — Emergency Department: Payer: Medicare PPO

## 2018-05-05 ENCOUNTER — Other Ambulatory Visit: Payer: Self-pay

## 2018-05-05 ENCOUNTER — Encounter: Payer: Self-pay | Admitting: Emergency Medicine

## 2018-05-05 ENCOUNTER — Emergency Department
Admission: EM | Admit: 2018-05-05 | Discharge: 2018-05-05 | Disposition: A | Discharge status: Home / Self Care | Payer: Medicare PPO | Attending: Emergency Medicine | Admitting: Emergency Medicine

## 2018-05-05 DIAGNOSIS — R42 Dizziness and giddiness: Secondary | ICD-10-CM | POA: Diagnosis not present

## 2018-05-05 DIAGNOSIS — R079 Chest pain, unspecified: Secondary | ICD-10-CM | POA: Insufficient documentation

## 2018-05-05 DIAGNOSIS — I10 Essential (primary) hypertension: Secondary | ICD-10-CM | POA: Diagnosis not present

## 2018-05-05 DIAGNOSIS — R000 Tachycardia, unspecified: Secondary | ICD-10-CM | POA: Diagnosis not present

## 2018-05-05 DIAGNOSIS — R0789 Other chest pain: Secondary | ICD-10-CM | POA: Diagnosis not present

## 2018-05-05 DIAGNOSIS — R112 Nausea with vomiting, unspecified: Secondary | ICD-10-CM | POA: Diagnosis not present

## 2018-05-05 DIAGNOSIS — R110 Nausea: Secondary | ICD-10-CM | POA: Diagnosis not present

## 2018-05-05 DIAGNOSIS — Z79899 Other long term (current) drug therapy: Secondary | ICD-10-CM | POA: Diagnosis not present

## 2018-05-05 LAB — BASIC METABOLIC PANEL
Anion gap: 8 (ref 5–15)
BUN: 15 mg/dL (ref 8–23)
CO2: 24 mmol/L (ref 22–32)
CREATININE: 0.87 mg/dL (ref 0.44–1.00)
Calcium: 9 mg/dL (ref 8.9–10.3)
Chloride: 107 mmol/L (ref 98–111)
GFR calc Af Amer: >60 mL/min (ref >60)
GFR calc non Af Amer: >60 mL/min (ref >60)
Glucose, Bld: 95 mg/dL (ref 70–99)
Potassium: 4.1 mmol/L (ref 3.5–5.1)
Sodium: 139 mmol/L (ref 135–145)

## 2018-05-05 LAB — CBC
HEMATOCRIT: 34.8 % — AB (ref 36.0–46.0)
Hemoglobin: 11.5 g/dL — ABNORMAL LOW (ref 12.0–15.0)
MCH: 29.2 pg (ref 26.0–34.0)
MCHC: 33 g/dL (ref 30.0–36.0)
MCV: 88.3 fL (ref 80.0–100.0)
Platelets: 294 10*3/uL (ref 150–400)
RBC: 3.94 MIL/uL (ref 3.87–5.11)
RDW: 12.4 % (ref 11.5–15.5)
WBC: 9.2 10*3/uL (ref 4.0–10.5)
nRBC: 0 % (ref 0.0–0.2)

## 2018-05-05 LAB — TROPONIN I
Troponin I: <0.03 ng/mL (ref <0.03)
Troponin I: <0.03 ng/mL (ref <0.03)

## 2018-05-05 MED ORDER — LISINOPRIL 10 MG PO TABS
20.0000 mg | ORAL_TABLET | Freq: Once | ORAL | Status: AC
  Administered 2018-05-05: 20 mg via ORAL
  Filled 2018-05-05: qty 2

## 2018-05-05 MED ORDER — ACETAMINOPHEN 500 MG PO TABS
1000.0000 mg | ORAL_TABLET | Freq: Once | ORAL | Status: AC
  Administered 2018-05-05: 1000 mg via ORAL
  Filled 2018-05-05: qty 2

## 2018-05-05 MED ORDER — NITROGLYCERIN 0.4 MG SL SUBL: 0.4000 mg | SUBLINGUAL_TABLET | SUBLINGUAL | 0 refills | Status: DC | PRN

## 2018-05-05 NOTE — Discharge Instructions (Signed)

## 2018-05-05 NOTE — ED Triage Notes (Signed)
Patient reports chest pain that radiated up her neck and to her shoulder blades approximately 1200 today. States she was nauseous and dizzy during episode. Patient denies SOB or sweating. No significant cardiac history. Patient states pain is much better now. Given 324 mg of ASA and 1 inch of nitro paste on left chest by EMS.

## 2018-05-05 NOTE — ED Notes (Signed)
When getting on stretcher pt stated, "I feel better can I just leave." Pt urged to stay to see the doctor. Pt agreed. Pt given another warm blanket.

## 2018-05-05 NOTE — ED Provider Notes (Signed)
[facility] Emergency Department Provider Note  ____________________________________________  Time seen: Approximately 7:10 PM  I have reviewed the triage vital signs and the nursing notes.   HISTORY  Chief Complaint Chest Pain   HPI Sarah Hernandez is a 67 y.o. female with a history of diverticulosis, GERD, hypertension, hyperlipidemia who presents for evaluation of chest pain.  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  She developed nausea and dizziness with it. No SOB.  The pain was initially severe.  911 was called.  When EMS arrived she had a normal EKG but severely elevated blood pressure.  An inch of Nitropaste was applied with full resolution of her pain.  The pain lasted 25 to 30 minutes.  Patient is pain-free at this time.  She denies any personal history of heart disease but has strong family history in siblings and parents.  She denies history of smoking.  She denies any prior episodes of chest pain.  No personal or family history of blood clots, recent travel immobilization, leg pain or swelling, hemoptysis, exogenous hormones, or history of cancer.  Past Medical History:  Diagnosis Date  . Diverticulosis   . GERD (gastroesophageal reflux disease)   . Hyperlipidemia   . Hypertension   . Irritable bowel   . Overactive bladder     Patient Active Problem List   Diagnosis Date Noted  . OAB (overactive bladder) 08/21/2016  . Esophagitis, reflux 10/05/2014  . Hemorrhoids, internal 10/05/2014  . Hyperlipidemia, mixed 10/05/2014  . Benign hypertension 10/05/2014  . Irritable bowel syndrome with constipation 10/05/2014  . Mild intermittent asthma without complication 10/05/2014  . Allergic rhinitis, seasonal 10/05/2014  . DD (diverticular disease) 12/13/2013    Past Surgical History:  Procedure Laterality Date  . COLONOSCOPY   2015   diverticulosis  . PARTIAL HYSTERECTOMY  1979   bleeding from fibroids  . TONSILLECTOMY  1966  . UPPER GASTROINTESTINAL ENDOSCOPY  09/2014   gastritis/esoph; no Barrett's    Prior to Admission medications   Medication Sig Start Date End Date Taking? Authorizing Provider  albuterol (PROVENTIL) (2.5 MG/3ML) 0.083% nebulizer solution Inhale 3 mLs into the lungs 4 (four) times daily as needed. Reported on 08/16/2015    [provider]  atorvastatin (LIPITOR) 40 MG tablet TAKE 1 TABLET (40 MG TOTAL) BY MOUTH AT BEDTIME. 09/06/17   Reubin Milan, MD  hyoscyamine (LEVSIN, ANASPAZ) 0.125 MG tablet Take 1 tablet (0.125 mg total) by mouth every 4 (four) hours as needed. 05/17/16   Reubin Milan, MD  lisinopril (PRINIVIL,ZESTRIL) 20 MG tablet Take 1 tablet (20 mg total) by mouth daily. 12/15/17   Reubin Milan, MD  metroNIDAZOLE (FLAGYL) 500 MG tablet Take 1 tablet (500 mg total) by mouth 3 (three) times daily. 04/16/18   Reubin Milan, MD  nitroGLYCERIN (NITROSTAT) 0.4 MG SL tablet Place 1 tablet (0.4 mg total) under the tongue every 5 (five) minutes as needed for chest pain. 05/05/18 05/05/19  Nita Sickle, MD  omeprazole (PRILOSEC) 20 MG capsule Take 1 capsule (20 mg total) by mouth daily. 11/05/17   Reubin Milan, MD  promethazine (PHENERGAN) 25 MG tablet Take 1 tablet (25 mg total) by mouth every 8 (eight) hours as needed for nausea or vomiting. 04/16/18   Reubin Milan, MD  sertraline (ZOLOFT) 50 MG tablet Take 1 tablet (  50 mg total) by mouth daily. 11/11/17   Reubin Milan, MD    Allergies Patient has no known allergies.  Family History  Problem Relation Age of Onset  . Dementia Mother   . CAD Father   . CAD Brother   . Stroke Brother     Social History Social History   Tobacco Use  . Smoking status: Never Smoker  . Smokeless tobacco: Never Used  . Tobacco comment: smoking cessation materials not required  Substance Use Topics  . Alcohol  use: Yes    Alcohol/week: 2.0 standard drinks    Types: 2 Standard drinks or equivalent per week    Comment: occasional  . Drug use: No    Review of Systems  Constitutional: Negative for fever. + Dizziness Eyes: Negative for visual changes. ENT: Negative for sore throat. Neck: No neck pain  Cardiovascular: + chest pain. Respiratory: No shortness of breath. Gastrointestinal: Negative for abdominal pain, vomiting or diarrhea. + nausea Genitourinary: Negative for dysuria. Musculoskeletal: Negative for back pain. Skin: Negative for rash. Neurological: Negative for headaches, weakness or numbness. Psych: No SI or HI  ____________________________________________   PHYSICAL EXAM:  VITAL SIGNS: ED Triage Vitals  Enc Vitals Group     BP 05/05/18 1310 (!) 170/80     Pulse Rate 05/05/18 1310 72     Resp 05/05/18 1310 18     Temp 05/05/18 1310 98.2 F (36.8 C)     Temp Source 05/05/18 1310 Oral     SpO2 05/05/18 1310 98 %     Weight 05/05/18 1311 156 lb 1.4 oz (70.8 kg)     Height 05/05/18 1311 5\' 5"  (1.651 m)     Head Circumference --      Peak Flow --      Pain Score 05/05/18 1311 2     Pain Loc --      Pain Edu? --      Excl. in GC? --     Constitutional: Alert and oriented. Well appearing and in no apparent distress. HEENT:      Head: Normocephalic and atraumatic.         Eyes: Conjunctivae are normal. Sclera is non-icteric.       Mouth/Throat: Mucous membranes are moist.       Neck: Supple with no signs of meningismus. Cardiovascular: Regular rate and rhythm. No murmurs, gallops, or rubs. 2+ symmetrical distal pulses are present in all extremities. No JVD. Respiratory: Normal respiratory effort. Lungs are clear to auscultation bilaterally. No wheezes, crackles, or rhonchi.  Gastrointestinal: Soft, non tender, and non distended with positive bowel sounds. No rebound or guarding. Musculoskeletal: Nontender with normal range of motion in all extremities. No edema,  cyanosis, or erythema of extremities. Neurologic: Normal speech and language. Face is symmetric. Moving all extremities. No gross focal neurologic deficits are appreciated. Skin: Skin is warm, dry and intact. No rash noted. Psychiatric: Mood and affect are normal. Speech and behavior are normal.  ____________________________________________   LABS (all labs ordered are listed, but only abnormal results are displayed)  Labs Reviewed  CBC - Abnormal; Notable for the following components:      Result Value   Hemoglobin 11.5 (*)    HCT 34.8 (*)    All other components within normal limits  BASIC METABOLIC PANEL  TROPONIN I  TROPONIN I   ____________________________________________  EKG  ED ECG REPORT I, Nita Sickle, the attending physician, personally viewed and interpreted this ECG.  13:05 -normal sinus  rhythm, rate of 67, normal intervals, borderline right axis deviation, no ST elevations or depressions.  No significant changes when compared to prior.  19:04 -normal sinus rhythm, rate of 69, normal intervals, normal axis, no ST elevations or depressions.  nORMAL ekg ____________________________________________  RADIOLOGY  I have personally reviewed the images performed during this visit and I agree with the Radiologist's read.   Interpretation by Radiologist:  Dg Chest 2 View  Result Date: 05/05/2018 CLINICAL DATA:  Chest pain. EXAM: CHEST - 2 VIEW COMPARISON:  CT 07/12/2009. FINDINGS: Mediastinum hilar structures normal. Lungs are clear. Mild left base pleural-parenchymal thickening most consistent scarring. No pleural effusion or pneumothorax. IMPRESSION: No acute cardiopulmonary disease. Electronically Signed   By: Maisie Fushomas  Register   On: 05/05/2018 14:22     ____________________________________________   PROCEDURES  Procedure(s) performed: None Procedures Critical Care performed:  None ____________________________________________   INITIAL IMPRESSION /  ASSESSMENT AND PLAN / ED COURSE  67 y.o. female with a history of diverticulosis, GERD, hypertension, hyperlipidemia who presents for evaluation of chest pain.  Presentation concerning for angina/ACS.  Interval Nitropaste was removed from patient's chest with no recurrence of her pain.  She underwent troponin and EKG x2 which were both unremarkable.  Patient is moderate risk with a heart score of 6.  Discussed admission for further evaluation however patient prefers to see a cardiologist as an outpatient. I will refer patient to Dr. Welton FlakesKhan for further evaluation.  In the meantime I will provide patient with a prescription for nitroglycerin.  I had a very detailed conversation with patient and her husband and told her about my concerns that her pain was coming from ACS and that she needed to return to the emergency room immediately if the pain returned.  Patient understands these recommendations and is comfortable with the plan.      As part of my medical decision making, I reviewed the following data within the electronic MEDICAL RECORD NUMBER Nursing notes reviewed and incorporated, Labs reviewed , Old EKG reviewed, Old chart reviewed, Radiograph reviewed , Notes from prior ED visits and [place] Controlled Substance Database    Pertinent labs & imaging results that were available during my care of the patient were reviewed by me and considered in my medical decision making (see chart for details).    ____________________________________________   FINAL CLINICAL IMPRESSION(S) / ED DIAGNOSES  Final diagnoses:  Chest pain, unspecified type      NEW MEDICATIONS STARTED DURING THIS VISIT:  ED Discharge Orders         Ordered    nitroGLYCERIN (NITROSTAT) 0.4 MG SL tablet  Every 5 min PRN     05/05/18 1925           Note:  This document was prepared using Dragon voice recognition software and may include unintentional dictation errors.    Nita SickleVeronese, [place], MD 05/05/18 [phone]

## 2018-05-07 ENCOUNTER — Other Ambulatory Visit: Payer: Self-pay | Admitting: Internal Medicine

## 2018-05-07 ENCOUNTER — Encounter: Payer: Self-pay | Admitting: Internal Medicine

## 2018-05-07 ENCOUNTER — Telehealth: Payer: Self-pay

## 2018-05-07 DIAGNOSIS — R0789 Other chest pain: Secondary | ICD-10-CM

## 2018-05-07 NOTE — Telephone Encounter (Signed)
Please Advise patient message. Should patient come in to discuss?

## 2018-05-07 NOTE — Telephone Encounter (Signed)
Patient would like to see Dr Gwen Poundskowalski for HTN.  Please Advise.

## 2018-05-07 NOTE — Telephone Encounter (Signed)
-----   Message from Reubin MilanLaura H Berglund, MD sent at 05/06/2018  1:21 PM EST ----- Patient was in ED yesterday with chest pain and referred to Dr. Welton FlakesKhan.  I would [facility].  Please call her and verify that she does not have an appt with Dr. Welton FlakesKhan yet.

## 2018-05-10 ENCOUNTER — Other Ambulatory Visit: Payer: Self-pay | Admitting: Internal Medicine

## 2018-05-14 DIAGNOSIS — I208 Other forms of angina pectoris: Secondary | ICD-10-CM | POA: Diagnosis not present

## 2018-05-14 DIAGNOSIS — J45909 Unspecified asthma, uncomplicated: Secondary | ICD-10-CM | POA: Diagnosis not present

## 2018-05-14 DIAGNOSIS — I10 Essential (primary) hypertension: Secondary | ICD-10-CM | POA: Diagnosis not present

## 2018-05-14 DIAGNOSIS — K219 Gastro-esophageal reflux disease without esophagitis: Secondary | ICD-10-CM | POA: Diagnosis not present

## 2018-05-14 DIAGNOSIS — E785 Hyperlipidemia, unspecified: Secondary | ICD-10-CM | POA: Diagnosis not present

## 2018-05-14 DIAGNOSIS — R0602 Shortness of breath: Secondary | ICD-10-CM | POA: Diagnosis not present

## 2018-05-14 DIAGNOSIS — F419 Anxiety disorder, unspecified: Secondary | ICD-10-CM | POA: Diagnosis not present

## 2018-06-10 DIAGNOSIS — I208 Other forms of angina pectoris: Secondary | ICD-10-CM | POA: Diagnosis not present

## 2018-06-10 DIAGNOSIS — R0602 Shortness of breath: Secondary | ICD-10-CM | POA: Diagnosis not present

## 2018-06-17 DIAGNOSIS — E785 Hyperlipidemia, unspecified: Secondary | ICD-10-CM | POA: Diagnosis not present

## 2018-06-17 DIAGNOSIS — R0602 Shortness of breath: Secondary | ICD-10-CM | POA: Diagnosis not present

## 2018-06-17 DIAGNOSIS — K219 Gastro-esophageal reflux disease without esophagitis: Secondary | ICD-10-CM | POA: Diagnosis not present

## 2018-06-17 DIAGNOSIS — F419 Anxiety disorder, unspecified: Secondary | ICD-10-CM | POA: Diagnosis not present

## 2018-06-17 DIAGNOSIS — I10 Essential (primary) hypertension: Secondary | ICD-10-CM | POA: Diagnosis not present

## 2018-06-17 DIAGNOSIS — I208 Other forms of angina pectoris: Secondary | ICD-10-CM | POA: Diagnosis not present

## 2018-06-17 DIAGNOSIS — J45909 Unspecified asthma, uncomplicated: Secondary | ICD-10-CM | POA: Diagnosis not present

## 2018-08-31 ENCOUNTER — Ambulatory Visit: Payer: Medicare PPO

## 2018-09-02 ENCOUNTER — Telehealth: Payer: Self-pay

## 2018-09-02 ENCOUNTER — Ambulatory Visit: Payer: Medicare PPO

## 2018-09-02 NOTE — Telephone Encounter (Signed)
Attempted to reach patient regarding AWV sched for [facility] 09/07/18. Need to confirm if patient can complete visit via video chat.   Unable to leave message. If pt calls back please verify if patient is able to utilize WebEx, Skype or Facetime.   Thank you!

## 2018-09-07 ENCOUNTER — Ambulatory Visit (INDEPENDENT_AMBULATORY_CARE_PROVIDER_SITE_OTHER): Payer: Medicare PPO

## 2018-09-07 VITALS — BP 138/58 | HR 92 | Temp 97.6°F | Ht 65.0 in | Wt 156.0 lb

## 2018-09-07 DIAGNOSIS — Z1231 Encounter for screening mammogram for malignant neoplasm of breast: Secondary | ICD-10-CM

## 2018-09-07 DIAGNOSIS — Z0000 Encounter for general adult medical examination without abnormal findings: Secondary | ICD-10-CM

## 2018-09-07 NOTE — Patient Instructions (Signed)
Sarah Hernandez , Thank you for taking time to come for your Medicare Wellness Visit. I appreciate your ongoing commitment to your health goals. Please review the following plan we discussed and let me know if I can assist you in the future.   Screening recommendations/referrals: Colonoscopy: done 10/19/13. Repeat in 2025. Mammogram: done 08/21/17. Please call [facility] in [place] at [phone] to schedule your mammogram.  Bone Density: done 09/09/17 Recommended yearly ophthalmology/optometry visit for glaucoma screening and checkup Recommended yearly dental visit for hygiene and checkup  Vaccinations: Influenza vaccine: done 02/02/18 Pneumococcal vaccine: done 08/10/14 Tdap vaccine: due - please contact us if you get a cut or scrape Shingles vaccine: Shingrix discussed. Please contact your pharmacy for coverage information.   Advanced directives: Please bring a copy of your health care power of attorney and living will to the office at your convenience.  Conditions/risks identified: Recommend drinking 6-8 glasses of water per day.  Next appointment: Please follow up in one year for your Medicare Annual Wellness visit.     Preventive Care 71 Years and Older, Female Preventive care refers to lifestyle choices and visits with your health care provider that can promote health and wellness. What does preventive care include?  A yearly physical exam. This is also called an annual well check.  Dental exams once or twice a year.  Routine eye exams. Ask your health care provider how often you should have your eyes checked.  Personal lifestyle choices, including:  Daily care of your teeth and gums.  Regular physical activity.  Eating a healthy diet.  Avoiding tobacco and drug use.  Limiting alcohol use.  Practicing safe sex.  Taking low-dose aspirin every day.  Taking vitamin and mineral supplements as recommended by your health care provider. What happens during an annual well  check? The services and screenings done by your health care provider during your annual well check will depend on your age, overall health, lifestyle risk factors, and family history of disease. Counseling  Your health care provider may ask you questions about your:  Alcohol use.  Tobacco use.  Drug use.  Emotional well-being.  Home and relationship well-being.  Sexual activity.  Eating habits.  History of falls.  Memory and ability to understand (cognition).  Work and work Astronomer.  Reproductive health. Screening  You may have the following tests or measurements:  Height, weight, and BMI.  Blood pressure.  Lipid and cholesterol levels. These may be checked every 5 years, or more frequently if you are over 58 years old.  Skin check.  Lung cancer screening. You may have this screening every year starting at age 17 if you have a 30-pack-year history of smoking and currently smoke or have quit within the past 15 years.  Fecal occult blood test (FOBT) of the stool. You may have this test every year starting at age 71.  Flexible sigmoidoscopy or colonoscopy. You may have a sigmoidoscopy every 5 years or a colonoscopy every 10 years starting at age 3.  Hepatitis C blood test.  Hepatitis B blood test.  Sexually transmitted disease (STD) testing.  Diabetes screening. This is done by checking your blood sugar (glucose) after you have not eaten for a while (fasting). You may have this done every 1-3 years.  Bone density scan. This is done to screen for osteoporosis. You may have this done starting at age 83.  Mammogram. This may be done every 1-2 years. Talk to your health care provider about how often you should  have regular mammograms. Talk with your health care provider about your test results, treatment options, and if necessary, the need for more tests. Vaccines  Your health care provider may recommend certain vaccines, such as:  Influenza vaccine. This is  recommended every year.  Tetanus, diphtheria, and acellular pertussis (Tdap, Td) vaccine. You may need a Td booster every 10 years.  Zoster vaccine. You may need this after age 64.  Pneumococcal 13-valent conjugate (PCV13) vaccine. One dose is recommended after age 59.  Pneumococcal polysaccharide (PPSV23) vaccine. One dose is recommended after age 72. Talk to your health care provider about which screenings and vaccines you need and how often you need them. This information is not intended to replace advice given to you by your health care provider. Make sure you discuss any questions you have with your health care provider. Document Released: 06/09/2015 Document Revised: 01/31/2016 Document Reviewed: 03/14/2015 Elsevier Interactive Patient Education  2017 [place] Prevention in the Home Falls can cause injuries. They can happen to people of all ages. There are many things you can do to make your home safe and to help prevent falls. What can I do on the outside of my home?  Regularly fix the edges of walkways and driveways and fix any cracks.  Remove anything that might make you trip as you walk through a door, such as a raised step or threshold.  Trim any bushes or trees on the path to your home.  Use bright outdoor lighting.  Clear any walking paths of anything that might make someone trip, such as rocks or tools.  Regularly check to see if handrails are loose or broken. Make sure that both sides of any steps have handrails.  Any raised decks and porches should have guardrails on the edges.  Have any leaves, snow, or ice cleared regularly.  Use sand or salt on walking paths during winter.  Clean up any spills in your garage right away. This includes oil or grease spills. What can I do in the bathroom?  Use night lights.  Install grab bars by the toilet and in the tub and shower. Do not use towel bars as grab bars.  Use non-skid mats or decals in the tub or  shower.  If you need to sit down in the shower, use a plastic, non-slip stool.  Keep the floor dry. Clean up any water that spills on the floor as soon as it happens.  Remove soap buildup in the tub or shower regularly.  Attach bath mats securely with double-sided non-slip rug tape.  Do not have throw rugs and other things on the floor that can make you trip. What can I do in the bedroom?  Use night lights.  Make sure that you have a light by your bed that is easy to reach.  Do not use any sheets or blankets that are too big for your bed. They should not hang down onto the floor.  Have a firm chair that has side arms. You can use this for support while you get dressed.  Do not have throw rugs and other things on the floor that can make you trip. What can I do in the kitchen?  Clean up any spills right away.  Avoid walking on wet floors.  Keep items that you use a lot in easy-to-reach places.  If you need to reach something above you, use a strong step stool that has a grab bar.  Keep electrical cords out of  the way.  Do not use floor polish or wax that makes floors slippery. If you must use wax, use non-skid floor wax.  Do not have throw rugs and other things on the floor that can make you trip. What can I do with my stairs?  Do not leave any items on the stairs.  Make sure that there are handrails on both sides of the stairs and use them. Fix handrails that are broken or loose. Make sure that handrails are as long as the stairways.  Check any carpeting to make sure that it is firmly attached to the stairs. Fix any carpet that is loose or worn.  Avoid having throw rugs at the top or bottom of the stairs. If you do have throw rugs, attach them to the floor with carpet tape.  Make sure that you have a light switch at the top of the stairs and the bottom of the stairs. If you do not have them, ask someone to add them for you. What else can I do to help prevent falls?   Wear shoes that:  Do not have high heels.  Have rubber bottoms.  Are comfortable and fit you well.  Are closed at the toe. Do not wear sandals.  If you use a stepladder:  Make sure that it is fully opened. Do not climb a closed stepladder.  Make sure that both sides of the stepladder are locked into place.  Ask someone to hold it for you, if possible.  Clearly mark and make sure that you can see:  Any grab bars or handrails.  First and last steps.  Where the edge of each step is.  Use tools that help you move around (mobility aids) if they are needed. These include:  Canes.  Walkers.  Scooters.  Crutches.  Turn on the lights when you go into a dark area. Replace any light bulbs as soon as they burn out.  Set up your furniture so you have a clear path. Avoid moving your furniture around.  If any of your floors are uneven, fix them.  If there are any pets around you, be aware of where they are.  Review your medicines with your doctor. Some medicines can make you feel dizzy. This can increase your chance of falling. Ask your doctor what other things that you can do to help prevent falls. This information is not intended to replace advice given to you by your health care provider. Make sure you discuss any questions you have with your health care provider. Document Released: 03/09/2009 Document Revised: 10/19/2015 Document Reviewed: 06/17/2014 Elsevier Interactive Patient Education  2017 [employer].

## 2018-09-07 NOTE — Progress Notes (Signed)
Subjective:   Sarah Hernandez is a 68 y.o. female who presents for Medicare Annual (Subsequent) preventive examination.   This visit is being conducted via virtual visit due to the COVID-19 pandemic. This patient has given me verbal consent via video chat to conduct this visit. Some vital signs may be absent or patient reported.    Review of Systems:   Cardiac Risk Factors include: advanced age (>103men, >73 women);dyslipidemia;hypertension     Objective:     Vitals: BP (!) 138/58    Pulse 92    Temp 97.6 F (36.4 C) (Oral)    Ht 5\' 5"  (1.651 m)    Wt 156 lb (70.8 kg)    BMI 25.96 kg/m   Body mass index is 25.96 kg/m.  Advanced Directives 09/07/2018 05/05/2018 08/27/2017 08/21/2016 08/16/2015  Does Patient Have a Medical Advance Directive? Yes Yes Yes Yes Yes  Type of Estate agent of [place];Living will Healthcare Power of [place];Living will;Out of facility DNR (pink MOST or yellow form) Healthcare Power of [place];Living will Living will Healthcare Power of [place];Living will  Copy of Healthcare Power of Attorney in Chart? No - copy requested No - copy requested No - copy requested - -    Tobacco Social History   Tobacco Use  Smoking Status Never Smoker  Smokeless Tobacco Never Used  Tobacco Comment   smoking cessation materials not required     Counseling given: Not Answered Comment: smoking cessation materials not required   Clinical Intake:  Pre-visit preparation completed: Yes  Pain : No/denies pain     BMI - recorded: 25.96 Nutritional Status: BMI 25 -29 Overweight Nutritional Risks: None Diabetes: No  How often do you need to have someone help you when you read instructions, pamphlets, or other written materials from your doctor or pharmacy?: 1 - Never What is the last grade level you completed in school?: 12th grade  Interpreter Needed?: No  Information entered by :: Reather Littler LPN  Past Medical History:  Diagnosis Date     Allergy Seasonal   Anxiety Meds   Asthma Meds as needed   Diverticulosis    GERD (gastroesophageal reflux disease)    Hyperlipidemia    Hypertension    Irritable bowel    Overactive bladder    Past Surgical History:  Procedure Laterality Date   ABDOMINAL HYSTERECTOMY  1997   APPENDECTOMY  1997   COLONOSCOPY  2015   diverticulosis   PARTIAL HYSTERECTOMY  1979   bleeding from fibroids   TONSILLECTOMY  1966   UPPER GASTROINTESTINAL ENDOSCOPY  09/2014   gastritis/esoph; no Barrett's   Family History  Problem Relation Age of Onset   Dementia Mother    Varicose Veins Mother    CAD Father    Heart disease Father    CAD Brother    Stroke Brother    Social History   Socioeconomic History   Marital status: Married    Spouse name: Not on file   Number of children: 2   Years of education: some college   Highest education level: 12th grade  Occupational History   Occupation: part time    Comment: Energy manager strain: Not hard at all   Food insecurity:    Worry: Never true    Inability: Never true   Transportation needs:    Medical: No    Non-medical: No  Tobacco Use   Smoking status: Never Smoker   Smokeless  tobacco: Never Used   Tobacco comment: smoking cessation materials not required  Substance and Sexual Activity   Alcohol use: Yes    Alcohol/week: 2.0 standard drinks    Types: 2 Standard drinks or equivalent per week    Comment: Occasionally   Drug use: No   Sexual activity: Not on file  Lifestyle   Physical activity:    Days per week: 7 days    Minutes per session: 40 min   Stress: Not at all  Relationships   Social connections:    Talks on phone: More than three times a week    Gets together: Three times a week    Attends religious service: Never    Active member of club or organization: No    Attends meetings of clubs or organizations: Never    Relationship status:  Married  Other Topics Concern   Not on file  Social History Narrative   Not on file    Outpatient Encounter Medications as of 09/07/2018  Medication Sig   albuterol (PROVENTIL) (2.5 MG/3ML) 0.083% nebulizer solution Inhale 3 mLs into the lungs 4 (four) times daily as needed. Reported on 08/16/2015   atorvastatin (LIPITOR) 40 MG tablet TAKE 1 TABLET (40 MG TOTAL) BY MOUTH AT BEDTIME.   hyoscyamine (LEVSIN, ANASPAZ) 0.125 MG tablet Take 1 tablet (0.125 mg total) by mouth every 4 (four) hours as needed.   metoprolol succinate (TOPROL-XL) 25 MG 24 hr tablet Take by mouth.   nitroGLYCERIN (NITROSTAT) 0.4 MG SL tablet Place 1 tablet (0.4 mg total) under the tongue every 5 (five) minutes as needed for chest pain.   omeprazole (PRILOSEC) 20 MG capsule TAKE 1 CAPSULE BY MOUTH EVERY DAY   sertraline (ZOLOFT) 50 MG tablet Take 1 tablet (50 mg total) by mouth daily.   amLODipine (NORVASC) 5 MG tablet Take 5 mg by mouth daily.    [DISCONTINUED] lisinopril (PRINIVIL,ZESTRIL) 20 MG tablet Take 1 tablet (20 mg total) by mouth daily.   [DISCONTINUED] metroNIDAZOLE (FLAGYL) 500 MG tablet Take 1 tablet (500 mg total) by mouth 3 (three) times daily.   [DISCONTINUED] promethazine (PHENERGAN) 25 MG tablet Take 1 tablet (25 mg total) by mouth every 8 (eight) hours as needed for nausea or vomiting.   No facility-administered encounter medications on file as of 09/07/2018.     Activities of Daily Living In your present state of health, do you have any difficulty performing the following activities: 09/07/2018  Hearing? N  Comment denies hearing aids  Vision? N  Comment eyeglasses  Difficulty concentrating or making decisions? N  Walking or climbing stairs? N  Dressing or bathing? N  Doing errands, shopping? N  Preparing Food and eating ? N  Using the Toilet? N  In the past six months, have you accidently leaked urine? N  Do you have problems with loss of bowel control? N  Managing your  Medications? N  Managing your Finances? N  Housekeeping or managing your Housekeeping? N  Some recent data might be hidden    Patient Care Team: Reubin Milan, MD as PCP - General (Family Medicine)    Assessment:   This is a routine wellness examination for Sarah Hernandez.  Exercise Activities and Dietary recommendations Current Exercise Habits: Home exercise routine, Type of exercise: walking, Time (Minutes): 40, Frequency (Times/Week): 7, Weekly Exercise (Minutes/Week): 280, Intensity: Mild, Exercise limited by: None identified  Goals     DIET - INCREASE WATER INTAKE     Recommend to drink at least  6-8 8oz glasses of water per day.       Fall Risk Fall Risk  09/07/2018 08/27/2017 08/21/2016 08/16/2015  Falls in the past year? 0 No No No  Number falls in past yr: 0 - - -  Injury with Fall? 0 - - -  Risk for fall due to : - Impaired vision;History of fall(s) - -  Risk for fall due to: Comment - has fallen many times in the past due to "poor choices"; wears eyeglasses - -  Follow up Falls prevention discussed - - -   FALL RISK PREVENTION PERTAINING TO THE HOME:  Any stairs in or around the home? Yes  If so, do they handrails? Yes   Home free of loose throw rugs in walkways, pet beds, electrical cords, etc? Yes  Adequate lighting in your home to reduce risk of falls? Yes   ASSISTIVE DEVICES UTILIZED TO PREVENT FALLS:  Life alert? No  Use of a cane, walker or w/c? No  Grab bars in the bathroom? No Shower chair or bench in shower? NO Elevated toilet seat or a handicapped toilet? No   DME ORDERS:  DME order needed?  No   TIMED UP AND GO:  Was the test performed? Yes .  Length of time to ambulate 10 feet: 5 sec.   GAIT:  Appearance of gait: Gait stead-fast and without the use of an assistive device.    Education: Fall risk prevention has been discussed.  Intervention(s) required? No   Depression Screen PHQ 2/9 Scores 09/07/2018 08/27/2017 08/21/2016 08/16/2015  PHQ - 2  Score 0 0 0 0  PHQ- 9 Score - 0 - -     Cognitive Function     6CIT Screen 09/07/2018 08/27/2017 08/21/2016  What Year? 0 points 0 points 0 points  What month? 0 points 0 points 0 points  What time? 0 points 0 points 0 points  Count back from 20 0 points 0 points 0 points  Months in reverse 0 points 0 points 0 points  Repeat phrase 0 points 0 points 0 points  Total Score 0 0 0    Immunization History  Administered Date(s) Administered   Influenza, High Dose Seasonal PF 02/02/2018   Influenza,inj,Quad PF,6+ Mos 03/08/2015, 02/14/2016, 01/28/2017   Pneumococcal Conjugate-13 08/10/2014   Pneumococcal Polysaccharide-23 06/15/2013   Tdap 05/28/2008   Zoster 05/28/2012    Qualifies for Shingles Vaccine? Yes  Zostavax completed 2014. Due for Shingrix. Education has been provided regarding the importance of this vaccine. Pt has been advised to call insurance company to determine out of pocket expense. Advised may also receive vaccine at local pharmacy or Health Dept. Verbalized acceptance and understanding.  Tdap: Although this vaccine is not a covered service during a Wellness Exam, does the patient still wish to receive this vaccine today?  No .  Education has been provided regarding the importance of this vaccine. Advised may receive this vaccine at local pharmacy or Health Dept. Aware to provide a copy of the vaccination record if obtained from local pharmacy or Health Dept. Verbalized acceptance and understanding.  Flu Vaccine: Up to date  Pneumococcal Vaccine: Up to date   Screening Tests Health Maintenance  Topic Date Due   TETANUS/TDAP  05/28/2018   MAMMOGRAM  08/22/2018   INFLUENZA VACCINE  12/26/2018   COLONOSCOPY  10/20/2023   Hepatitis C Screening  Completed   PNA vac Low Risk Adult  Completed   DEXA SCAN  Addressed    Cancer Screenings:  Colorectal Screening: Completed 10/19/13. Repeat every 10 years  Mammogram: Completed 08/21/17. Repeat every year;  Ordered today. Pt provided with contact information and advised to call to schedule appt.   Bone Density: Completed 09/09/17. Results reflect NORMAL. Repeat every 2 years.   Lung Cancer Screening: (Low Dose CT Chest recommended if Age 35-80 years, 30 pack-year currently smoking OR have quit w/in 15years.) does not qualify.    Additional Screening:  Hepatitis C Screening: does qualify; Completed 08/16/15  Vision Screening: Recommended annual ophthalmology exams for early detection of glaucoma and other disorders of the eye. Is the patient up to date with their annual eye exam?  Yes  Who is the provider or what is the name of the office in which the pt attends annual eye exams? [facility]  Dental Screening: Recommended annual dental exams for proper oral hygiene  Community Resource Referral:  CRR required this visit?  No      Plan:     I have personally reviewed and addressed the Medicare Annual Wellness questionnaire and have noted the following in the patients chart:  A. Medical and social history B. Use of alcohol, tobacco or illicit drugs  C. Current medications and supplements D. Functional ability and status E.  Nutritional status F.  Physical activity G. Advance directives H. List of other physicians I.  Hospitalizations, surgeries, and ER visits in previous 12 months J.  Vitals K. Screenings such as hearing and vision if needed, cognitive and depression L. Referrals and appointments   In addition, I have reviewed and discussed with patient certain preventive protocols, quality metrics, and best practice recommendations. A written personalized care plan for preventive services as well as general preventive health recommendations were provided to patient.   Signed,  Reather Littler, LPN Nurse Health Advisor   Nurse Notes: pt doing well and appreciative of virtual AWV today.

## 2018-09-09 ENCOUNTER — Other Ambulatory Visit: Payer: Self-pay

## 2018-09-09 ENCOUNTER — Ambulatory Visit (INDEPENDENT_AMBULATORY_CARE_PROVIDER_SITE_OTHER): Payer: Medicare PPO | Admitting: Internal Medicine

## 2018-09-09 ENCOUNTER — Encounter: Payer: Self-pay | Admitting: Internal Medicine

## 2018-09-09 VITALS — BP 124/70 | HR 68 | Ht 65.0 in | Wt 159.4 lb

## 2018-09-09 DIAGNOSIS — J4520 Mild intermittent asthma, uncomplicated: Secondary | ICD-10-CM

## 2018-09-09 DIAGNOSIS — Z1231 Encounter for screening mammogram for malignant neoplasm of breast: Secondary | ICD-10-CM

## 2018-09-09 DIAGNOSIS — I10 Essential (primary) hypertension: Secondary | ICD-10-CM | POA: Diagnosis not present

## 2018-09-09 DIAGNOSIS — E782 Mixed hyperlipidemia: Secondary | ICD-10-CM

## 2018-09-09 DIAGNOSIS — F411 Generalized anxiety disorder: Secondary | ICD-10-CM | POA: Insufficient documentation

## 2018-09-09 DIAGNOSIS — N3281 Overactive bladder: Secondary | ICD-10-CM

## 2018-09-09 DIAGNOSIS — K2100 Gastro-esophageal reflux disease with esophagitis, without bleeding: Secondary | ICD-10-CM

## 2018-09-09 DIAGNOSIS — Z23 Encounter for immunization: Secondary | ICD-10-CM

## 2018-09-09 DIAGNOSIS — R7989 Other specified abnormal findings of blood chemistry: Secondary | ICD-10-CM | POA: Diagnosis not present

## 2018-09-09 DIAGNOSIS — Z0000 Encounter for general adult medical examination without abnormal findings: Secondary | ICD-10-CM

## 2018-09-09 LAB — POCT URINALYSIS DIPSTICK
Bilirubin, UA: NEGATIVE
Blood, UA: NEGATIVE
Glucose, UA: NEGATIVE
Ketones, UA: NEGATIVE
Leukocytes, UA: NEGATIVE
Nitrite, UA: NEGATIVE
Protein, UA: NEGATIVE
Spec Grav, UA: 1.015 (ref 1.010–1.025)
Urobilinogen, UA: 0.2 E.U./dL
pH, UA: 6.5 (ref 5.0–8.0)

## 2018-09-09 MED ORDER — SERTRALINE HCL 50 MG PO TABS: 50.0000 mg | ORAL_TABLET | Freq: Every day | ORAL | 3 refills | Status: DC

## 2018-09-09 MED ORDER — ZOSTER VAC RECOMB ADJUVANTED 50 MCG/0.5ML IM SUSR: 0.5000 mL | Freq: Once | INTRAMUSCULAR | 1 refills | Status: AC

## 2018-09-09 MED ORDER — ATORVASTATIN CALCIUM 40 MG PO TABS: 40.0000 mg | ORAL_TABLET | Freq: Every day | ORAL | 3 refills | Status: DC

## 2018-09-09 MED ORDER — OMEPRAZOLE 20 MG PO CPDR: 20.0000 mg | DELAYED_RELEASE_CAPSULE | Freq: Every day | ORAL | 3 refills | Status: DC

## 2018-09-09 NOTE — Patient Instructions (Signed)
Reduce amlodipine to 1/2 tablet daily for better blood pressure control and reduced swelling  This information is directly available on the CDC website: DiscoHelp.si.html    Source:CDC Reference to specific commercial products, manufacturers, companies, or trademarks does not constitute its endorsement or recommendation by the U.S. Government, Department of Health and [employer], or Centers for [employer] and Prevention.

## 2018-09-09 NOTE — Progress Notes (Signed)
Date:  09/09/2018   Name:  Vernard GamblesLinda G Gabler   DOB:  1950/11/10   MRN:  [id number]   Chief Complaint: Annual Exam (Breast Exam. NO PAP) Joaquin CourtsLinda G Charon is a 68 y.o. female who presents today for her Complete Annual Exam. She feels well. She reports exercising walking and golfing. She reports she is sleeping fairly well. She denies breast issues. Mammogram 07/2017 - scheduled for this June at [facility] Colonoscopy 2015 Pneumovax and Prevnar completed  Hypertension  This is a chronic problem. The problem is controlled. Associated symptoms include anxiety. Pertinent negatives include no chest pain, headaches, palpitations or shortness of breath. Past treatments include calcium channel blockers and beta blockers (edema with amlodipine 5 mg so stopped it - now takes it once in a while when BP is high.  General readings at home up to 150 systolic). The current treatment provides significant improvement.  Hyperlipidemia  The problem is controlled. Pertinent negatives include no chest pain or shortness of breath. Current antihyperlipidemic treatment includes statins. The current treatment provides significant improvement of lipids.  Asthma  There is no cough, shortness of breath or wheezing. This is a recurrent problem. The problem occurs rarely. Pertinent negatives include no chest pain, fever, headaches or trouble swallowing. Her symptoms are alleviated by beta-agonist. She reports significant improvement on treatment. Her past medical history is significant for asthma.  Urinary Frequency   This is a recurrent problem. The problem has been unchanged. The patient is experiencing no pain. Associated symptoms include frequency. Pertinent negatives include no chills or vomiting. Treatments tried: did not tolerate ditropan.  Gastroesophageal Reflux  She reports no abdominal pain, no chest pain, no coughing or no wheezing. This is a recurrent problem. Pertinent negatives include no fatigue. She has tried a PPI  for the symptoms. Past procedures include an EGD. several years ago showed gastritis.  Anxiety  Presents for follow-up visit. Patient reports no chest pain, dizziness, nervous/anxious behavior, palpitations or shortness of breath. Symptoms occur rarely. The quality of sleep is good.   Her past medical history is significant for asthma. Compliance with medications is 76-100%.   Lab Results  Component Value Date   CREATININE 0.87 05/05/2018   BUN 15 05/05/2018   NA 139 05/05/2018   K 4.1 05/05/2018   CL 107 05/05/2018   CO2 24 05/05/2018   Lab Results  Component Value Date   CHOL 192 09/03/2017   HDL 50 09/03/2017   LDLCALC 107 (H) 09/03/2017   TRIG 175 (H) 09/03/2017   CHOLHDL 3.8 09/03/2017    Review of Systems  Constitutional: Negative for chills, fatigue and fever.  HENT: Negative for congestion, hearing loss, tinnitus, trouble swallowing and voice change.   Eyes: Negative for visual disturbance.  Respiratory: Negative for cough, chest tightness, shortness of breath and wheezing.   Cardiovascular: Negative for chest pain, palpitations and leg swelling.  Gastrointestinal: Negative for abdominal pain, constipation, diarrhea and vomiting.  Endocrine: Negative for polydipsia and polyuria.  Genitourinary: Positive for frequency. Negative for dysuria, genital sores, vaginal bleeding and vaginal discharge.  Musculoskeletal: Positive for arthralgias (general mild intermittent of different joints). Negative for gait problem and joint swelling.  Skin: Negative for color change and rash.  Allergic/Immunologic: Positive for environmental allergies.  Neurological: Negative for dizziness, tremors, light-headedness and headaches.  Hematological: Negative for adenopathy. Does not bruise/bleed easily.  Psychiatric/Behavioral: Negative for dysphoric mood and sleep disturbance. The patient is not nervous/anxious.     Patient Active Problem List  Diagnosis Date Noted   OAB (overactive  bladder) 08/21/2016   Esophagitis, reflux 10/05/2014   Hemorrhoids, internal 10/05/2014   Hyperlipidemia, mixed 10/05/2014   Benign hypertension 10/05/2014   Irritable bowel syndrome with constipation 10/05/2014   Mild intermittent asthma without complication 10/05/2014   Allergic rhinitis, seasonal 10/05/2014   DD (diverticular disease) 12/13/2013    No Known Allergies  Past Surgical History:  Procedure Laterality Date   ABDOMINAL HYSTERECTOMY  1997   APPENDECTOMY  1997   COLONOSCOPY  2015   diverticulosis   PARTIAL HYSTERECTOMY  1979   bleeding from fibroids   TONSILLECTOMY  1966   UPPER GASTROINTESTINAL ENDOSCOPY  09/2014   gastritis/esoph; no Barrett's    Social History   Tobacco Use   Smoking status: Never Smoker   Smokeless tobacco: Never Used   Tobacco comment: smoking cessation materials not required  Substance Use Topics   Alcohol use: Yes    Alcohol/week: 2.0 standard drinks    Types: 2 Standard drinks or equivalent per week    Comment: Occasionally   Drug use: No     Medication list has been reviewed and updated.  Current Meds  Medication Sig   albuterol (PROVENTIL) (2.5 MG/3ML) 0.083% nebulizer solution Inhale 3 mLs into the lungs 4 (four) times daily as needed. Reported on 08/16/2015   amLODipine (NORVASC) 5 MG tablet Take 5 mg by mouth daily.    atorvastatin (LIPITOR) 40 MG tablet TAKE 1 TABLET (40 MG TOTAL) BY MOUTH AT BEDTIME.   hyoscyamine (LEVSIN, ANASPAZ) 0.125 MG tablet Take 1 tablet (0.125 mg total) by mouth every 4 (four) hours as needed.   metoprolol succinate (TOPROL-XL) 25 MG 24 hr tablet Take by mouth.   nitroGLYCERIN (NITROSTAT) 0.4 MG SL tablet Place 1 tablet (0.4 mg total) under the tongue every 5 (five) minutes as needed for chest pain.   omeprazole (PRILOSEC) 20 MG capsule TAKE 1 CAPSULE BY MOUTH EVERY DAY   sertraline (ZOLOFT) 50 MG tablet Take 1 tablet (50 mg total) by mouth daily.    PHQ 2/9 Scores  09/09/2018 09/07/2018 08/27/2017 08/21/2016  PHQ - 2 Score 0 0 0 0  PHQ- 9 Score - - 0 -    BP Readings from Last 3 Encounters:  09/09/18 124/70  09/07/18 (!) 138/58  05/05/18 (!) 174/78    Physical Exam Vitals signs and nursing note reviewed.  Constitutional:      General: She is not in acute distress.    Appearance: She is well-developed.  HENT:     Head: Normocephalic and atraumatic.     Right Ear: Tympanic membrane and ear canal normal.     Left Ear: Tympanic membrane and ear canal normal.     Nose:     Right Sinus: No maxillary sinus tenderness.     Left Sinus: No maxillary sinus tenderness.     Mouth/Throat:     Pharynx: Uvula midline.  Eyes:     General: No scleral icterus.       Right eye: No discharge.        Left eye: No discharge.     Conjunctiva/sclera: Conjunctivae normal.  Neck:     Musculoskeletal: Normal range of motion. No erythema.     Thyroid: No thyromegaly.     Vascular: No carotid bruit.  Cardiovascular:     Rate and Rhythm: Normal rate and regular rhythm.     Pulses: Normal pulses.     Heart sounds: Normal heart sounds.  Pulmonary:  Effort: Pulmonary effort is normal. No respiratory distress.     Breath sounds: No wheezing.  Chest:     Breasts:        Right: No mass, nipple discharge, skin change or tenderness.        Left: No mass, nipple discharge, skin change or tenderness.  Abdominal:     General: Bowel sounds are normal.     Palpations: Abdomen is soft.     Tenderness: There is no abdominal tenderness.  Musculoskeletal: Normal range of motion.  Lymphadenopathy:     Cervical: No cervical adenopathy.  Skin:    General: Skin is warm and dry.     Findings: No rash.  Neurological:     Mental Status: She is alert and oriented to person, place, and time.     Cranial Nerves: No cranial nerve deficit.     Sensory: No sensory deficit.     Deep Tendon Reflexes: Reflexes are normal and symmetric.  Psychiatric:        Speech: Speech normal.         Behavior: Behavior normal.        Thought Content: Thought content normal.     Wt Readings from Last 3 Encounters:  09/09/18 159 lb 6.4 oz (72.3 kg)  09/07/18 156 lb (70.8 kg)  05/05/18 156 lb 1.4 oz (70.8 kg)    BP 124/70    Pulse 68    Ht 5\' 5"  (1.651 m)    Wt 159 lb 6.4 oz (72.3 kg)    SpO2 99%    BMI 26.53 kg/m   Assessment and Plan: 1. Annual physical exam Normal exam Continue healthy diet and regular exercise  2. Encounter for screening mammogram for breast cancer Scheduled at [facility]  3. Benign hypertension Fair control Try amlodipine 2.5 mg daily, monitor BP - CBC with Differential/Platelet - Comprehensive metabolic panel - TSH  4. Mild intermittent asthma without complication stable  5. Esophagitis, reflux Controlled on daily PPI - CBC with Differential/Platelet - omeprazole (PRILOSEC) 20 MG capsule; Take 1 capsule (20 mg total) by mouth daily.  Dispense: 90 capsule; Refill: 3  6. Hyperlipidemia, mixed On statin therayp - Lipid panel - atorvastatin (LIPITOR) 40 MG tablet; Take 1 tablet (40 mg total) by mouth at bedtime.  Dispense: 90 tablet; Refill: 3  7. OAB (overactive bladder) Managed with life style changes - POCT urinalysis dipstick  8. Generalized social phobia Doing well on Zoloft - sertraline (ZOLOFT) 50 MG tablet; Take 1 tablet (50 mg total) by mouth daily.  Dispense: 90 tablet; Refill: 3  9. Need for shingles vaccine - Zoster Vaccine Adjuvanted [facility]) injection; Inject 0.5 mLs into the muscle once for 1 dose.  Dispense: 0.5 mL; Refill: 1  Partially dictated using Animal nutritionist. Any errors are unintentional.  Bari Edward, MD [facility]  09/09/2018

## 2018-09-10 ENCOUNTER — Encounter: Payer: Self-pay | Admitting: Internal Medicine

## 2018-09-10 LAB — CBC WITH DIFFERENTIAL/PLATELET
Basophils Absolute: 0.1 10*3/uL (ref 0.0–0.2)
Basos: 1 %
EOS (ABSOLUTE): 0.3 10*3/uL (ref 0.0–0.4)
Eos: 4 %
Hematocrit: 39.1 % (ref 34.0–46.6)
Hemoglobin: 12.9 g/dL (ref 11.1–15.9)
Immature Grans (Abs): 0 10*3/uL (ref 0.0–0.1)
Immature Granulocytes: 0 %
Lymphocytes Absolute: 2.4 10*3/uL (ref 0.7–3.1)
Lymphs: 29 %
MCH: 28.7 pg (ref 26.6–33.0)
MCHC: 33 g/dL (ref 31.5–35.7)
MCV: 87 fL (ref 79–97)
Monocytes Absolute: 0.4 10*3/uL (ref 0.1–0.9)
Monocytes: 5 %
Neutrophils Absolute: 5.2 10*3/uL (ref 1.4–7.0)
Neutrophils: 61 %
Platelets: 342 10*3/uL (ref 150–450)
RBC: 4.5 x10E6/uL (ref 3.77–5.28)
RDW: 12.6 % (ref 11.7–15.4)
WBC: 8.4 10*3/uL (ref 3.4–10.8)

## 2018-09-10 LAB — LIPID PANEL
Chol/HDL Ratio: 3.9 ratio (ref 0.0–4.4)
Cholesterol, Total: 179 mg/dL (ref 100–199)
HDL: 46 mg/dL (ref >39)
LDL Calculated: 94 mg/dL (ref 0–99)
Triglycerides: 195 mg/dL — ABNORMAL HIGH (ref 0–149)
VLDL Cholesterol Cal: 39 mg/dL (ref 5–40)

## 2018-09-10 LAB — COMPREHENSIVE METABOLIC PANEL
ALT: 21 IU/L (ref 0–32)
AST: 15 IU/L (ref 0–40)
Albumin/Globulin Ratio: 1.5 (ref 1.2–2.2)
Albumin: 4.3 g/dL (ref 3.8–4.8)
Alkaline Phosphatase: 124 IU/L — ABNORMAL HIGH (ref 39–117)
BUN/Creatinine Ratio: 15 (ref 12–28)
BUN: 15 mg/dL (ref 8–27)
Bilirubin Total: 0.4 mg/dL (ref 0.0–1.2)
CO2: 24 mmol/L (ref 20–29)
Calcium: 9.7 mg/dL (ref 8.7–10.3)
Chloride: 101 mmol/L (ref 96–106)
Creatinine, Ser: 0.97 mg/dL (ref 0.57–1.00)
GFR calc Af Amer: 68 mL/min/{1.73_m2} (ref >59)
GFR calc non Af Amer: 59 mL/min/{1.73_m2} — ABNORMAL LOW (ref >59)
Globulin, Total: 2.9 g/dL (ref 1.5–4.5)
Glucose: 94 mg/dL (ref 65–99)
Potassium: 4.4 mmol/L (ref 3.5–5.2)
Sodium: 138 mmol/L (ref 134–144)
Total Protein: 7.2 g/dL (ref 6.0–8.5)

## 2018-09-10 LAB — TSH: TSH: 5.81 u[IU]/mL — ABNORMAL HIGH (ref 0.450–4.500)

## 2018-09-10 NOTE — Telephone Encounter (Signed)
Please advise patient message.

## 2018-09-16 LAB — SPECIMEN STATUS REPORT

## 2018-09-16 LAB — GAMMA GT: GGT: 11 IU/L (ref 0–60)

## 2018-09-16 LAB — T3: T3, Total: 125 ng/dL (ref 71–180)

## 2018-09-16 LAB — T4: T4, Total: 8.1 ug/dL (ref 4.5–12.0)

## 2018-10-21 ENCOUNTER — Other Ambulatory Visit: Payer: Self-pay

## 2018-10-21 ENCOUNTER — Ambulatory Visit (INDEPENDENT_AMBULATORY_CARE_PROVIDER_SITE_OTHER): Payer: Medicare PPO | Admitting: Internal Medicine

## 2018-10-21 ENCOUNTER — Encounter: Payer: Self-pay | Admitting: Internal Medicine

## 2018-10-21 VITALS — BP 138/86 | HR 76 | Ht 65.0 in | Wt 164.0 lb

## 2018-10-21 DIAGNOSIS — R21 Rash and other nonspecific skin eruption: Secondary | ICD-10-CM | POA: Diagnosis not present

## 2018-10-21 DIAGNOSIS — N760 Acute vaginitis: Secondary | ICD-10-CM | POA: Diagnosis not present

## 2018-10-21 DIAGNOSIS — B9689 Other specified bacterial agents as the cause of diseases classified elsewhere: Secondary | ICD-10-CM

## 2018-10-21 MED ORDER — METRONIDAZOLE 500 MG PO TABS: 500.0000 mg | ORAL_TABLET | Freq: Two times a day (BID) | ORAL | 0 refills | Status: AC

## 2018-10-21 NOTE — Progress Notes (Signed)
Date:  10/21/2018   Name:  Sarah Hernandez   DOB:  12/06/1950   MRN:  [id number]   Chief Complaint: Cyst (Cyst on Vagina. Says she wears loose clothing and when she starting wearing jeans she noticed a new irritation in the last 3 days. Trying hydrocortisone lotion. Rt labia has a knot in it. No pain. Feels it when she sits down- causes some discomfort. Has no obgyn. Not sexually active. )  HPI  Review of Systems  Constitutional: Negative for chills and fever.  Respiratory: Negative for chest tightness and shortness of breath.   Cardiovascular: Negative for chest pain and palpitations.  Genitourinary: Positive for genital sores. Negative for dyspareunia, pelvic pain and vaginal discharge.  Skin: Positive for color change. Negative for rash.  Neurological: Negative for dizziness and headaches.  Hematological: Negative for adenopathy.    Patient Active Problem List   Diagnosis Date Noted  . Generalized anxiety disorder 09/09/2018  . OAB (overactive bladder) 08/21/2016  . Esophagitis, reflux 10/05/2014  . Hemorrhoids, internal 10/05/2014  . Hyperlipidemia, mixed 10/05/2014  . Benign hypertension 10/05/2014  . Irritable bowel syndrome with constipation 10/05/2014  . Mild intermittent asthma without complication 10/05/2014  . Allergic rhinitis, seasonal 10/05/2014  . DD (diverticular disease) 12/13/2013    No Known Allergies  Past Surgical History:  Procedure Laterality Date  . ABDOMINAL HYSTERECTOMY  1997  . APPENDECTOMY  1997  . COLONOSCOPY  2015   diverticulosis  . PARTIAL HYSTERECTOMY  1979   bleeding from fibroids  . TONSILLECTOMY  1966  . UPPER GASTROINTESTINAL ENDOSCOPY  09/2014   gastritis/esoph; no Barrett's    Social History   Tobacco Use  . Smoking status: Never Smoker  . Smokeless tobacco: Never Used  . Tobacco comment: smoking cessation materials not required  Substance Use Topics  . Alcohol use: Yes    Alcohol/week: 2.0 standard drinks    Types: 2  Standard drinks or equivalent per week    Comment: Occasionally  . Drug use: No     Medication list has been reviewed and updated.  Current Meds  Medication Sig  . albuterol (PROVENTIL) (2.5 MG/3ML) 0.083% nebulizer solution Inhale 3 mLs into the lungs 4 (four) times daily as needed. Reported on 08/16/2015  . amLODipine (NORVASC) 5 MG tablet Take 5 mg by mouth daily.   Marland Kitchen atorvastatin (LIPITOR) 40 MG tablet Take 1 tablet (40 mg total) by mouth at bedtime.  . hyoscyamine (LEVSIN, ANASPAZ) 0.125 MG tablet Take 1 tablet (0.125 mg total) by mouth every 4 (four) hours as needed.  . metoprolol succinate (TOPROL-XL) 25 MG 24 hr tablet Take by mouth.  . nitroGLYCERIN (NITROSTAT) 0.4 MG SL tablet Place 1 tablet (0.4 mg total) under the tongue every 5 (five) minutes as needed for chest pain.  Marland Kitchen omeprazole (PRILOSEC) 20 MG capsule Take 1 capsule (20 mg total) by mouth daily.  . sertraline (ZOLOFT) 50 MG tablet Take 1 tablet (50 mg total) by mouth daily.    PHQ 2/9 Scores 10/21/2018 09/09/2018 09/07/2018 08/27/2017  PHQ - 2 Score 0 0 0 0  PHQ- 9 Score - - - 0    BP Readings from Last 3 Encounters:  10/21/18 138/86  09/09/18 124/70  09/07/18 (!) 138/58    Physical Exam Vitals signs and nursing note reviewed.  Constitutional:      General: She is not in acute distress.    Appearance: She is well-developed.  HENT:     Head: Normocephalic and atraumatic.  Cardiovascular:     Rate and Rhythm: Normal rate.  Pulmonary:     Effort: Pulmonary effort is normal. No respiratory distress.  Abdominal:     General: Abdomen is flat.     Palpations: Abdomen is soft.     Tenderness: There is no abdominal tenderness.  Genitourinary:    Pubic Area: No rash.      Labia:        Right: No rash, tenderness or lesion.        Left: No rash, tenderness or lesion.      Vagina: Lesions present.     Adnexa: Right adnexa normal and left adnexa normal.     Comments: 4 mm cyst at 6 o'clock just inside the vagina   Prominent mucosal folds at 9 o'clock about 1 inch inside the vagina  Mild mucosal atrophy noted, cuff intact, no other lesions and no bleeding Musculoskeletal: Normal range of motion.  Skin:    General: Skin is warm and dry.     Findings: No rash.       Neurological:     Mental Status: She is alert and oriented to person, place, and time.  Psychiatric:        Behavior: Behavior normal.        Thought Content: Thought content normal.     Wt Readings from Last 3 Encounters:  10/21/18 164 lb (74.4 kg)  09/09/18 159 lb 6.4 oz (72.3 kg)  09/07/18 156 lb (70.8 kg)    BP 138/86   Pulse 76   Ht 5\' 5"  (1.651 m)   Wt 164 lb (74.4 kg)   SpO2 96%   BMI 27.29 kg/m   Assessment and Plan: 1. BV (bacterial vaginosis) If irritation persists, will need GYN evaluation - metroNIDAZOLE (FLAGYL) 500 MG tablet; Take 1 tablet (500 mg total) by mouth 2 (two) times daily for 7 days.  Dispense: 14 tablet; Refill: 0  2. Rash Unclear etiology Pt to monitor for worsening   Partially dictated using Animal nutritionistDragon software. Any errors are unintentional.  Bari EdwardLaura Wilhelmena Zea, MD [facility]  10/21/2018

## 2018-10-28 ENCOUNTER — Other Ambulatory Visit: Payer: Self-pay

## 2018-10-28 ENCOUNTER — Ambulatory Visit (INDEPENDENT_AMBULATORY_CARE_PROVIDER_SITE_OTHER): Payer: Medicare PPO

## 2018-10-28 ENCOUNTER — Encounter: Payer: Self-pay | Admitting: Internal Medicine

## 2018-10-28 ENCOUNTER — Ambulatory Visit (INDEPENDENT_AMBULATORY_CARE_PROVIDER_SITE_OTHER): Payer: Medicare PPO | Admitting: Internal Medicine

## 2018-10-28 VITALS — Ht 65.0 in

## 2018-10-28 DIAGNOSIS — Z23 Encounter for immunization: Secondary | ICD-10-CM | POA: Diagnosis not present

## 2018-10-28 DIAGNOSIS — Z1231 Encounter for screening mammogram for malignant neoplasm of breast: Secondary | ICD-10-CM | POA: Diagnosis not present

## 2018-10-28 NOTE — Progress Notes (Signed)
Patient came in the office today to receive a TDAP vaccine since its been over 10 years for her last dose.  She did cut her Left Great Toe on 10/25/2018, Sunday. She said she was walking outside of her home and hit her Left Great Toe on a brick beside her garden that had lifted up with the root of a tree in her yard.   She feels fine. No pain. No bleeding today. Cut is about a 1 cm long on the tip of her Left great toe.  Gave patient her TDAP injection in her left deltoid in Room 3. Patient tolerated well. Gave patient TDAP VIS and explained possible side effects from vaccine.  She verbalized understanding.

## 2018-11-21 ENCOUNTER — Other Ambulatory Visit: Payer: Self-pay | Admitting: *Deleted

## 2018-11-21 DIAGNOSIS — Z20822 Contact with and (suspected) exposure to covid-19: Secondary | ICD-10-CM

## 2018-11-21 DIAGNOSIS — R6889 Other general symptoms and signs: Secondary | ICD-10-CM | POA: Diagnosis not present

## 2018-11-27 LAB — NOVEL CORONAVIRUS, NAA: SARS-CoV-2, NAA: NOT DETECTED

## 2019-01-06 ENCOUNTER — Encounter: Payer: Self-pay | Admitting: Internal Medicine

## 2019-01-06 NOTE — Telephone Encounter (Signed)
Please advise patient message.

## 2019-01-20 ENCOUNTER — Other Ambulatory Visit: Payer: Self-pay

## 2019-01-20 ENCOUNTER — Ambulatory Visit (INDEPENDENT_AMBULATORY_CARE_PROVIDER_SITE_OTHER): Payer: Medicare PPO

## 2019-01-20 DIAGNOSIS — Z23 Encounter for immunization: Secondary | ICD-10-CM

## 2019-02-25 ENCOUNTER — Encounter: Payer: Self-pay | Admitting: Internal Medicine

## 2019-02-25 ENCOUNTER — Ambulatory Visit (INDEPENDENT_AMBULATORY_CARE_PROVIDER_SITE_OTHER): Payer: Medicare PPO | Admitting: Internal Medicine

## 2019-02-25 ENCOUNTER — Ambulatory Visit: Payer: Medicare PPO | Admitting: Internal Medicine

## 2019-02-25 ENCOUNTER — Other Ambulatory Visit: Payer: Self-pay

## 2019-02-25 VITALS — BP 124/74 | HR 78 | Ht 65.0 in | Wt 163.8 lb

## 2019-02-25 DIAGNOSIS — I10 Essential (primary) hypertension: Secondary | ICD-10-CM

## 2019-02-25 DIAGNOSIS — R748 Abnormal levels of other serum enzymes: Secondary | ICD-10-CM | POA: Diagnosis not present

## 2019-02-25 DIAGNOSIS — Z23 Encounter for immunization: Secondary | ICD-10-CM

## 2019-02-25 MED ORDER — SHINGRIX 50 MCG/0.5ML IM SUSR: 0.5000 mL | Freq: Once | INTRAMUSCULAR | 1 refills | Status: AC

## 2019-02-25 NOTE — Progress Notes (Signed)
Date:  02/25/2019   Name:  Sarah Hernandez   DOB:  03-11-1951   MRN:  [id number]   Chief Complaint: Lab recheck  Hypertension This is a chronic problem. The problem is controlled. Pertinent negatives include no chest pain, headaches, palpitations or shortness of breath. Past treatments include calcium channel blockers and beta blockers (cut amlodipine in half). There are no compliance problems.   Elevated Alk PHos - noted on labs in April .  GGT was normal.  Pt is here for a recheck.  She denies bone or joint pain, abdominal pain or bloating, jaundice or dark urine.  Lab Results  Component Value Date   ALT 21 09/09/2018   AST 15 09/09/2018   GGT 11 09/09/2018   ALKPHOS 124 (H) 09/09/2018   BILITOT 0.4 09/09/2018   Lab Results  Component Value Date   GGT 11 09/09/2018     Review of Systems  Constitutional: Negative for chills, fatigue and fever.  Respiratory: Negative for chest tightness and shortness of breath.   Cardiovascular: Negative for chest pain, palpitations and leg swelling.  Musculoskeletal: Negative for arthralgias and back pain.  Neurological: Negative for dizziness and headaches.    Patient Active Problem List   Diagnosis Date Noted  . Generalized anxiety disorder 09/09/2018  . OAB (overactive bladder) 08/21/2016  . Esophagitis, reflux 10/05/2014  . Hemorrhoids, internal 10/05/2014  . Hyperlipidemia, mixed 10/05/2014  . Benign hypertension 10/05/2014  . Irritable bowel syndrome with constipation 10/05/2014  . Mild intermittent asthma without complication 82/50/0370  . Allergic rhinitis, seasonal 10/05/2014  . DD (diverticular disease) 12/13/2013    No Known Allergies  Past Surgical History:  Procedure Laterality Date  . ABDOMINAL HYSTERECTOMY  1997  . APPENDECTOMY  1997  . COLONOSCOPY  2015   diverticulosis  . PARTIAL HYSTERECTOMY  1979   bleeding from fibroids  . TONSILLECTOMY  1966  . UPPER GASTROINTESTINAL ENDOSCOPY  09/2014   gastritis/esoph; no Barrett's    Social History   Tobacco Use  . Smoking status: Never Smoker  . Smokeless tobacco: Never Used  . Tobacco comment: smoking cessation materials not required  Substance Use Topics  . Alcohol use: Yes    Alcohol/week: 2.0 standard drinks    Types: 2 Standard drinks or equivalent per week    Comment: Occasionally  . Drug use: No     Medication list has been reviewed and updated.  Current Meds  Medication Sig  . albuterol (PROVENTIL) (2.5 MG/3ML) 0.083% nebulizer solution Inhale 3 mLs into the lungs 4 (four) times daily as needed. Reported on 08/16/2015  . amLODipine (NORVASC) 5 MG tablet Take 2.5 mg by mouth daily.   Marland Kitchen atorvastatin (LIPITOR) 40 MG tablet Take 1 tablet (40 mg total) by mouth at bedtime.  . hyoscyamine (LEVSIN, ANASPAZ) 0.125 MG tablet Take 1 tablet (0.125 mg total) by mouth every 4 (four) hours as needed.  . metoprolol succinate (TOPROL-XL) 25 MG 24 hr tablet Take by mouth.  . nitroGLYCERIN (NITROSTAT) 0.4 MG SL tablet Place 1 tablet (0.4 mg total) under the tongue every 5 (five) minutes as needed for chest pain.  Marland Kitchen omeprazole (PRILOSEC) 20 MG capsule Take 1 capsule (20 mg total) by mouth daily.  . sertraline (ZOLOFT) 50 MG tablet Take 1 tablet (50 mg total) by mouth daily.    PHQ 2/9 Scores 02/25/2019 10/21/2018 09/09/2018 09/07/2018  PHQ - 2 Score 0 0 0 0  PHQ- 9 Score - - - -    BP  Readings from Last 3 Encounters:  02/25/19 124/74  10/21/18 138/86  09/09/18 124/70    Physical Exam Vitals signs and nursing note reviewed.  Constitutional:      General: She is not in acute distress.    Appearance: Normal appearance. She is well-developed.  HENT:     Head: Normocephalic and atraumatic.  Cardiovascular:     Rate and Rhythm: Normal rate and regular rhythm.  Pulmonary:     Effort: Pulmonary effort is normal. No respiratory distress.     Breath sounds: Normal breath sounds.  Abdominal:     General: Abdomen is flat. There is no  distension.     Palpations: There is no mass.  Musculoskeletal: Normal range of motion.  Lymphadenopathy:     Cervical: No cervical adenopathy.  Skin:    General: Skin is warm and dry.     Capillary Refill: Capillary refill takes less than 2 seconds.     Findings: No rash.  Neurological:     Mental Status: She is alert and oriented to person, place, and time.  Psychiatric:        Behavior: Behavior normal.        Thought Content: Thought content normal.     Wt Readings from Last 3 Encounters:  02/25/19 163 lb 12.8 oz (74.3 kg)  10/21/18 164 lb (74.4 kg)  09/09/18 159 lb 6.4 oz (72.3 kg)    BP 124/74   Pulse 78   Ht 5' 5" (1.651 m)   Wt 163 lb 12.8 oz (74.3 kg)   SpO2 97%   BMI 27.26 kg/m   Assessment and Plan: 1. Benign hypertension Clinically stable exam with well controlled BP.   Tolerating medications, amlodipine 2.5 mg and metoprolol 25 mg, without side effects at this time. Pt to continue current regimen and low sodium diet; benefits of regular exercise as able discussed.   2. Elevated alkaline phosphatase level No s/s liver disease or bone complaints Will recheck labs - Comprehensive metabolic panel - Gamma GT  3. Need for shingles vaccine - Zoster Vaccine Adjuvanted [facility]) injection; Inject 0.5 mLs into the muscle once for 1 dose.  Dispense: 0.5 mL; Refill: 1   Partially dictated using Editor, commissioning. Any errors are unintentional.  Halina Maidens, MD [facility]  02/25/2019

## 2019-02-26 LAB — COMPREHENSIVE METABOLIC PANEL
ALT: 19 IU/L (ref 0–32)
AST: 15 IU/L (ref 0–40)
Albumin/Globulin Ratio: 1.7 (ref 1.2–2.2)
Albumin: 4.3 g/dL (ref 3.8–4.8)
Alkaline Phosphatase: 117 IU/L (ref 39–117)
BUN/Creatinine Ratio: 14 (ref 12–28)
BUN: 15 mg/dL (ref 8–27)
Bilirubin Total: 0.4 mg/dL (ref 0.0–1.2)
CO2: 22 mmol/L (ref 20–29)
Calcium: 9.6 mg/dL (ref 8.7–10.3)
Chloride: 105 mmol/L (ref 96–106)
Creatinine, Ser: 1.09 mg/dL — ABNORMAL HIGH (ref 0.57–1.00)
GFR calc Af Amer: 59 mL/min/{1.73_m2} — ABNORMAL LOW (ref >59)
GFR calc non Af Amer: 52 mL/min/{1.73_m2} — ABNORMAL LOW (ref >59)
Globulin, Total: 2.5 g/dL (ref 1.5–4.5)
Glucose: 111 mg/dL — ABNORMAL HIGH (ref 65–99)
Potassium: 4.4 mmol/L (ref 3.5–5.2)
Sodium: 140 mmol/L (ref 134–144)
Total Protein: 6.8 g/dL (ref 6.0–8.5)

## 2019-02-26 LAB — GAMMA GT: GGT: 11 IU/L (ref 0–60)

## 2019-04-07 DIAGNOSIS — C44729 Squamous cell carcinoma of skin of left lower limb, including hip: Secondary | ICD-10-CM | POA: Diagnosis not present

## 2019-04-07 DIAGNOSIS — D485 Neoplasm of uncertain behavior of skin: Secondary | ICD-10-CM | POA: Diagnosis not present

## 2019-04-07 DIAGNOSIS — Z20828 Contact with and (suspected) exposure to other viral communicable diseases: Secondary | ICD-10-CM | POA: Diagnosis not present

## 2019-05-18 DIAGNOSIS — C44729 Squamous cell carcinoma of skin of left lower limb, including hip: Secondary | ICD-10-CM | POA: Diagnosis not present

## 2019-05-26 DIAGNOSIS — I872 Venous insufficiency (chronic) (peripheral): Secondary | ICD-10-CM | POA: Diagnosis not present

## 2019-06-21 DIAGNOSIS — D239 Other benign neoplasm of skin, unspecified: Secondary | ICD-10-CM | POA: Diagnosis not present

## 2019-06-21 DIAGNOSIS — L817 Pigmented purpuric dermatosis: Secondary | ICD-10-CM | POA: Diagnosis not present

## 2019-06-27 DIAGNOSIS — Z20828 Contact with and (suspected) exposure to other viral communicable diseases: Secondary | ICD-10-CM | POA: Diagnosis not present

## 2019-06-30 DIAGNOSIS — I10 Essential (primary) hypertension: Secondary | ICD-10-CM | POA: Diagnosis not present

## 2019-06-30 DIAGNOSIS — E785 Hyperlipidemia, unspecified: Secondary | ICD-10-CM | POA: Diagnosis not present

## 2019-06-30 DIAGNOSIS — K219 Gastro-esophageal reflux disease without esophagitis: Secondary | ICD-10-CM | POA: Diagnosis not present

## 2019-06-30 DIAGNOSIS — R0602 Shortness of breath: Secondary | ICD-10-CM | POA: Diagnosis not present

## 2019-06-30 DIAGNOSIS — J45909 Unspecified asthma, uncomplicated: Secondary | ICD-10-CM | POA: Diagnosis not present

## 2019-06-30 DIAGNOSIS — I208 Other forms of angina pectoris: Secondary | ICD-10-CM | POA: Diagnosis not present

## 2019-06-30 DIAGNOSIS — F419 Anxiety disorder, unspecified: Secondary | ICD-10-CM | POA: Diagnosis not present

## 2019-07-09 ENCOUNTER — Emergency Department: Payer: Medicare PPO

## 2019-07-09 ENCOUNTER — Emergency Department
Admission: EM | Admit: 2019-07-09 | Discharge: 2019-07-09 | Discharge status: Against Medical Advice | Payer: Medicare PPO | Attending: Student | Admitting: Student

## 2019-07-09 ENCOUNTER — Other Ambulatory Visit: Payer: Self-pay

## 2019-07-09 DIAGNOSIS — Z79899 Other long term (current) drug therapy: Secondary | ICD-10-CM | POA: Insufficient documentation

## 2019-07-09 DIAGNOSIS — R079 Chest pain, unspecified: Secondary | ICD-10-CM | POA: Diagnosis not present

## 2019-07-09 DIAGNOSIS — I10 Essential (primary) hypertension: Secondary | ICD-10-CM | POA: Insufficient documentation

## 2019-07-09 DIAGNOSIS — R0789 Other chest pain: Secondary | ICD-10-CM

## 2019-07-09 DIAGNOSIS — J45909 Unspecified asthma, uncomplicated: Secondary | ICD-10-CM | POA: Diagnosis not present

## 2019-07-09 DIAGNOSIS — Z5320 Procedure and treatment not carried out because of patient's decision for unspecified reasons: Secondary | ICD-10-CM | POA: Insufficient documentation

## 2019-07-09 LAB — BASIC METABOLIC PANEL
Anion gap: 9 (ref 5–15)
BUN: 17 mg/dL (ref 8–23)
CO2: 24 mmol/L (ref 22–32)
Calcium: 8.8 mg/dL — ABNORMAL LOW (ref 8.9–10.3)
Chloride: 104 mmol/L (ref 98–111)
Creatinine, Ser: 0.92 mg/dL (ref 0.44–1.00)
GFR calc Af Amer: >60 mL/min (ref >60)
GFR calc non Af Amer: >60 mL/min (ref >60)
Glucose, Bld: 111 mg/dL — ABNORMAL HIGH (ref 70–99)
Potassium: 4.7 mmol/L (ref 3.5–5.1)
Sodium: 137 mmol/L (ref 135–145)

## 2019-07-09 LAB — LIPASE, BLOOD: Lipase: 20 U/L (ref 11–51)

## 2019-07-09 LAB — HEPATIC FUNCTION PANEL
ALT: 22 U/L (ref 0–44)
AST: 25 U/L (ref 15–41)
Albumin: 3.8 g/dL (ref 3.5–5.0)
Alkaline Phosphatase: 113 U/L (ref 38–126)
Bilirubin, Direct: 0.3 mg/dL — ABNORMAL HIGH (ref 0.0–0.2)
Indirect Bilirubin: 0.7 mg/dL (ref 0.3–0.9)
Total Bilirubin: 1 mg/dL (ref 0.3–1.2)
Total Protein: 7.1 g/dL (ref 6.5–8.1)

## 2019-07-09 LAB — CBC
HCT: 37.1 % (ref 36.0–46.0)
Hemoglobin: 12 g/dL (ref 12.0–15.0)
MCH: 28.2 pg (ref 26.0–34.0)
MCHC: 32.3 g/dL (ref 30.0–36.0)
MCV: 87.1 fL (ref 80.0–100.0)
Platelets: 289 10*3/uL (ref 150–400)
RBC: 4.26 MIL/uL (ref 3.87–5.11)
RDW: 13.1 % (ref 11.5–15.5)
WBC: 8.7 10*3/uL (ref 4.0–10.5)
nRBC: 0 % (ref 0.0–0.2)

## 2019-07-09 LAB — TROPONIN I (HIGH SENSITIVITY): Troponin I (High Sensitivity): 3 ng/L (ref <18)

## 2019-07-09 MED ORDER — SODIUM CHLORIDE 0.9% FLUSH: 3.0000 mL | Freq: Once | INTRAVENOUS | Status: DC

## 2019-07-09 NOTE — ED Notes (Signed)
Recollect of green top tube due to hemolysis, pt has a 20g in the right forearm that ems placed. Blood draw obtained via this manner

## 2019-07-09 NOTE — ED Triage Notes (Addendum)
Pt arrives via ems, pt states that her pain started under the right shoulder blade and came around and is in the center of her chest. Pt reports taking one nitro prior to ems arrival with some relief.  Pt states that it felt as if someone was standing on her chest due to the pressure she was having, denies doing anything stressful at the time it started.

## 2019-07-09 NOTE — ED Provider Notes (Signed)
[facility] Emergency Department Provider Note  ____________________________________________   First MD Initiated Contact with Patient 07/09/19 1605     (approximate)  I have reviewed the triage vital signs and the nursing notes.  History  Chief Complaint Chest Pain    HPI Sarah Hernandez is a 69 y.o. female with history of GERD, HTN, HLD who presents to the emergency department for an episode of chest pain.  Patient reports the onset of chest discomfort around 11 AM.  Right sided, seemed to originate in the back area near her shoulder blade and radiate to the center chest.  She reports it is a heaviness, pressure type sensation. Moderate at onset.  No associated nausea, diaphoresis, shortness of breath.  She did take 1 sublingual nitro prior to EMS arrival with some relief. No aggravating components. 324 mg ASA given by EMS prior to arrival.  On my evaluation she is chest pain-free.  Denies any history of similar symptoms.  Denies any stress or exertion prior to developing symptoms.  No abdominal pain.  No preceding eating prior to onset of symptoms.  Recently seen by her cardiologist, Dr. Juliann Pares on 2/3 for routine checkup, patient states no issues at her clinic visit.  Was not experiencing any of these symptoms at that time.  On chart review, had an NM myocardial perfusion scan January 2020, impression: "Borderline abnormal myocardial perfusion scan evidence of mild apical thinning no clear evidence of significant reversible ischemia normal overall left ventricular function no evidence of ischemia, conclusion low risk ejection fraction of 66% recommend conservative medical therapy"   Past Medical Hx Past Medical History:  Diagnosis Date  . Allergy Seasonal  . Anxiety Meds  . Asthma Meds as needed  . Diverticulosis   . GERD (gastroesophageal reflux disease)   . Hyperlipidemia   . Hypertension   . Irritable bowel   . Overactive bladder     Problem  List Patient Active Problem List   Diagnosis Date Noted  . Generalized anxiety disorder 09/09/2018  . OAB (overactive bladder) 08/21/2016  . Esophagitis, reflux 10/05/2014  . Hemorrhoids, internal 10/05/2014  . Hyperlipidemia, mixed 10/05/2014  . Benign hypertension 10/05/2014  . Irritable bowel syndrome with constipation 10/05/2014  . Mild intermittent asthma without complication 10/05/2014  . Allergic rhinitis, seasonal 10/05/2014  . DD (diverticular disease) 12/13/2013    Past Surgical Hx Past Surgical History:  Procedure Laterality Date  . ABDOMINAL HYSTERECTOMY  1997  . APPENDECTOMY  1997  . COLONOSCOPY  2015   diverticulosis  . PARTIAL HYSTERECTOMY  1979   bleeding from fibroids  . TONSILLECTOMY  1966  . UPPER GASTROINTESTINAL ENDOSCOPY  09/2014   gastritis/esoph; no Barrett's    Medications Prior to Admission medications   Medication Sig Start Date End Date Taking? Authorizing Provider  albuterol (PROVENTIL) (2.5 MG/3ML) 0.083% nebulizer solution Inhale 3 mLs into the lungs 4 (four) times daily as needed. Reported on 08/16/2015    [provider]  amLODipine (NORVASC) 5 MG tablet Take 2.5 mg by mouth daily.  08/29/18   [provider]  atorvastatin (LIPITOR) 40 MG tablet Take 1 tablet (40 mg total) by mouth at bedtime. 09/09/18   Reubin Milan, MD  hyoscyamine (LEVSIN, ANASPAZ) 0.125 MG tablet Take 1 tablet (0.125 mg total) by mouth every 4 (four) hours as needed. 05/17/16   Reubin Milan, MD  metoprolol succinate (TOPROL-XL) 25 MG 24 hr tablet Take by mouth. 05/14/18 05/14/19  [provider]  nitroGLYCERIN (  NITROSTAT) 0.4 MG SL tablet Place 1 tablet (0.4 mg total) under the tongue every 5 (five) minutes as needed for chest pain. 05/05/18 05/05/19  Rudene Re, MD  omeprazole (PRILOSEC) 20 MG capsule Take 1 capsule (20 mg total) by mouth daily. 09/09/18   Glean Hess, MD  sertraline (ZOLOFT) 50 MG tablet Take 1 tablet (50 mg  total) by mouth daily. 09/09/18   Glean Hess, MD    Allergies Patient has no known allergies.  Family Hx Family History  Problem Relation Age of Onset  . Dementia Mother   . Varicose Veins Mother   . CAD Father   . Heart disease Father   . CAD Brother   . Stroke Brother     Social Hx Social History   Tobacco Use  . Smoking status: Never Smoker  . Smokeless tobacco: Never Used  . Tobacco comment: smoking cessation materials not required  Substance Use Topics  . Alcohol use: Yes    Alcohol/week: 2.0 standard drinks    Types: 2 Standard drinks or equivalent per week    Comment: Occasionally  . Drug use: No     Review of Systems  Constitutional: Negative for fever, chills. Eyes: Negative for visual changes. ENT: Negative for sore throat. Cardiovascular: + for chest pain. Respiratory: Negative for shortness of breath. Gastrointestinal: Negative for nausea, vomiting.  Genitourinary: Negative for dysuria. Musculoskeletal: Negative for leg swelling. Skin: Negative for rash. Neurological: Negative for headaches.   Physical Exam  Vital Signs: ED Triage Vitals  Enc Vitals Group     BP 07/09/19 1310 (!) 156/61     Pulse Rate 07/09/19 1310 62     Resp 07/09/19 1310 18     Temp 07/09/19 1310 97.8 F (36.6 C)     Temp Source 07/09/19 1310 Oral     SpO2 07/09/19 1310 99 %     Weight 07/09/19 1311 161 lb (73 kg)     Height 07/09/19 1311 5\' 4"  (1.626 m)     Head Circumference --      Peak Flow --      Pain Score 07/09/19 1310 3     Pain Loc --      Pain Edu? --      Excl. in [place]? --     Constitutional: Alert and oriented.  Head: Normocephalic. Atraumatic. Eyes: Conjunctivae clear. Sclera anicteric. Nose: No congestion. No rhinorrhea. Mouth/Throat: Wearing mask.  Neck: No stridor.   Cardiovascular: Normal rate, regular rhythm. Extremities well perfused. Respiratory: Normal respiratory effort.  Lungs CTAB. Gastrointestinal: Soft. Non-tender.  Non-distended.  Musculoskeletal: No lower extremity edema. No deformities. Neurologic:  Normal speech and language. No gross focal neurologic deficits are appreciated.  Skin: Skin is warm, dry and intact. No rash noted. Psychiatric: Mood and affect are appropriate for situation.  EKG  Personally reviewed.   Rate: 62 Rhythm: sinus Axis: normal Intervals: WNL No acute ischemic changes No STEMI    Radiology  XR: IMPRESSION:  Stable scarring left lower lobe region. Lungs elsewhere clear.  Stable cardiac silhouette. No adenopathy.    Procedures  Procedure(s) performed (including critical care):  Procedures   Initial Impression / Assessment and Plan / ED Course  69 y.o. female who presents to the ED for an episode of chest pain, as above.  Ddx: ACS, GERD, pleurisy, pulmonary infection, biliary colic  Will evaluate with labs, EKG, imaging. Already received ASA by EMS PTA.  EKG without acute ischemic changes.  Initial troponin negative.  Remainder of labs, including lipase, HFP without actionable derangements.  Patient moderate risk HEART score (history - 1, age - 2, risk factors - 2 = 5).  She did recently see her cardiologist earlier this week in the clinic, however at that time she was not experiencing any symptoms similar to this.  Last cardiac evaluation was in January 2020 including a myocardial perfusion scan, as noted above.  Discussed potential admission for further cardiac work-up.  At this time, she does not desire/declines admission.  She is agreeable to repeat troponin and close outpatient cardiology follow-up. Will obtain delta troponin and reassess.  5:33 PM Notified by RN that patient has chosen to leave AMA prior to repeat labs being drawn.    Final Clinical Impression(s) / ED Diagnosis  Final diagnoses:  Chest discomfort       Note:  This document was prepared using Dragon voice recognition software and may include unintentional dictation errors.    Miguel Aschoff., MD 07/09/19 1739

## 2019-07-09 NOTE — ED Notes (Signed)
Patient upset when this RN entered room stating "I want this IV out so I can leave. The other girl had to rush out and couldn't draw my blood. I am not as important as the other people." This RN explained to patient that I was here to help and could draw her blood and do all tasks now. Other RN was tied up with a critical patient. Patient nicely refused and asked for her IV to be taken out so she could leave. This RN got d/c blood pressure,had patient sign AMA, and took out IV. Patient ambulated out to lobby with no problems. MD made aware

## 2019-09-08 ENCOUNTER — Ambulatory Visit (INDEPENDENT_AMBULATORY_CARE_PROVIDER_SITE_OTHER): Payer: Medicare PPO

## 2019-09-08 DIAGNOSIS — Z0000 Encounter for general adult medical examination without abnormal findings: Secondary | ICD-10-CM | POA: Diagnosis not present

## 2019-09-08 DIAGNOSIS — Z1231 Encounter for screening mammogram for malignant neoplasm of breast: Secondary | ICD-10-CM

## 2019-09-08 DIAGNOSIS — Z780 Asymptomatic menopausal state: Secondary | ICD-10-CM | POA: Diagnosis not present

## 2019-09-08 NOTE — Progress Notes (Addendum)
Subjective:   Sarah Hernandez is a 69 y.o. female who presents for Medicare Annual (Subsequent) preventive examination.  Virtual Visit via Telephone Note  I connected with Sarah Hernandez on 09/08/19 at  8:00 AM EDT by telephone and verified that I am speaking with the correct person using two identifiers.  Medicare Annual Wellness visit completed telephonically due to Covid-19 pandemic.   Location: Patient: home Provider: office   I discussed the limitations, risks, security and privacy concerns of performing an evaluation and management service by telephone and the availability of in person appointments. The patient expressed understanding and agreed to proceed.  Some vital signs may be absent or patient reported.   Reather Littler, LPN    Review of Systems:   Cardiac Risk Factors include: advanced age (>41men, >33 women);hypertension;dyslipidemia     Objective:     Vitals: There were no vitals taken for this visit.  There is no height or weight on file to calculate BMI.  Advanced Directives 09/08/2019 07/09/2019 09/07/2018 05/05/2018 08/27/2017 08/21/2016 08/16/2015  Does Patient Have a Medical Advance Directive? Yes Yes Yes Yes Yes Yes Yes  Type of Estate agent of [place];Living will Healthcare Power of [place];Living will Healthcare Power of [place];Living will Healthcare Power of [place];Living will;Out of facility DNR (pink MOST or yellow form) Healthcare Power of [place];Living will Living will Healthcare Power of [place];Living will  Copy of Healthcare Power of Attorney in Chart? No - copy requested - No - copy requested No - copy requested No - copy requested - -    Tobacco Social History   Tobacco Use  Smoking Status Never Smoker  Smokeless Tobacco Never Used  Tobacco Comment   smoking cessation materials not required     Counseling given: Not Answered Comment: smoking cessation materials not required   Clinical Intake:  Pre-visit  preparation completed: Yes  Pain : No/denies pain     Nutritional Risks: None Diabetes: No  How often do you need to have someone help you when you read instructions, pamphlets, or other written materials from your doctor or pharmacy?: 1 - Never  Interpreter Needed?: No  Information entered by :: Reather Littler LPN  Past Medical History:  Diagnosis Date  . Allergy Seasonal  . Anxiety Meds  . Asthma Meds as needed  . Diverticulosis   . GERD (gastroesophageal reflux disease)   . Hyperlipidemia   . Hypertension   . Irritable bowel   . Overactive bladder    Past Surgical History:  Procedure Laterality Date  . ABDOMINAL HYSTERECTOMY  1997  . APPENDECTOMY  1997  . COLONOSCOPY  2015   diverticulosis  . PARTIAL HYSTERECTOMY  1979   bleeding from fibroids  . TONSILLECTOMY  1966  . UPPER GASTROINTESTINAL ENDOSCOPY  09/2014   gastritis/esoph; no Barrett's   Family History  Problem Relation Age of Onset  . Dementia Mother   . Varicose Veins Mother   . CAD Father   . Heart disease Father   . CAD Brother   . Stroke Brother    Social History   Socioeconomic History  . Marital status: Married    Spouse name: Not on file  . Number of children: 2  . Years of education: some college  . Highest education level: 12th grade  Occupational History  . Occupation: part time    Comment: Optician, dispensing  Tobacco Use  . Smoking status: Never Smoker  . Smokeless tobacco: Never Used  . Tobacco comment: smoking  cessation materials not required  Substance and Sexual Activity  . Alcohol use: Yes    Alcohol/week: 2.0 standard drinks    Types: 2 Standard drinks or equivalent per week    Comment: Occasionally  . Drug use: No  . Sexual activity: Not on file  Other Topics Concern  . Not on file  Social History Narrative  . Not on file   Social Determinants of Health   Financial Resource Strain: Low Risk   . Difficulty of Paying Living Expenses: Not hard at all  Food  Insecurity: No Food Insecurity  . Worried About Charity fundraiser in the Last Year: Never true  . Ran Out of Food in the Last Year: Never true  Transportation Needs: No Transportation Needs  . Lack of Transportation (Medical): No  . Lack of Transportation (Non-Medical): No  Physical Activity: Sufficiently Active  . Days of Exercise per Week: 7 days  . Minutes of Exercise per Session: 40 min  Stress: No Stress Concern Present  . Feeling of Stress : Not at all  Social Connections: Somewhat Isolated  . Frequency of Communication with Friends and Family: Not on file  . Frequency of Social Gatherings with Friends and Family: Three times a week  . Attends Religious Services: Never  . Active Member of Clubs or Organizations: No  . Attends Archivist Meetings: Never  . Marital Status: Married    Outpatient Encounter Medications as of 09/08/2019  Medication Sig  . albuterol (PROVENTIL) (2.5 MG/3ML) 0.083% nebulizer solution Inhale 3 mLs into the lungs 4 (four) times daily as needed. Reported on 08/16/2015  . atorvastatin (LIPITOR) 40 MG tablet Take 1 tablet (40 mg total) by mouth at bedtime.  . hyoscyamine (LEVSIN, ANASPAZ) 0.125 MG tablet Take 1 tablet (0.125 mg total) by mouth every 4 (four) hours as needed.  Marland Kitchen omeprazole (PRILOSEC) 20 MG capsule Take 1 capsule (20 mg total) by mouth daily.  . sertraline (ZOLOFT) 50 MG tablet Take 1 tablet (50 mg total) by mouth daily.  . metoprolol succinate (TOPROL-XL) 25 MG 24 hr tablet Take by mouth.  . nitroGLYCERIN (NITROSTAT) 0.4 MG SL tablet Place 1 tablet (0.4 mg total) under the tongue every 5 (five) minutes as needed for chest pain.  . [DISCONTINUED] amLODipine (NORVASC) 5 MG tablet Take 2.5 mg by mouth daily.    No facility-administered encounter medications on file as of 09/08/2019.    Activities of Daily Living In your present state of health, do you have any difficulty performing the following activities: 09/08/2019  Hearing? N    Comment declines hearing aids  Vision? N  Difficulty concentrating or making decisions? N  Walking or climbing stairs? N  Dressing or bathing? N  Doing errands, shopping? N  Preparing Food and eating ? N  Using the Toilet? N  In the past six months, have you accidently leaked urine? Y  Comment wears liners for protection  Do you have problems with loss of bowel control? N  Managing your Medications? N  Managing your Finances? N  Housekeeping or managing your Housekeeping? N  Some recent data might be hidden    Patient Care Team: Glean Hess, MD as PCP - General (Internal Medicine)    Assessment:   This is a routine wellness examination for [place].  Exercise Activities and Dietary recommendations Current Exercise Habits: Home exercise routine, Type of exercise: walking, Time (Minutes): 40, Frequency (Times/Week): 7, Weekly Exercise (Minutes/Week): 280, Intensity: Moderate, Exercise limited by: None  identified  Goals    . DIET - INCREASE WATER INTAKE     Recommend to drink at least 6-8 8oz glasses of water per day.       Fall Risk Fall Risk  09/08/2019 10/21/2018 09/09/2018 09/07/2018 08/27/2017  Falls in the past year? 0 0 0 0 No  Number falls in past yr: 0 0 0 0 -  Injury with Fall? 0 0 0 0 -  Risk for fall due to : No Fall Risks - - - Impaired vision;History of fall(s)  Risk for fall due to: Comment - - - - has fallen many times in the past due to "poor choices"; wears eyeglasses  Follow up Falls prevention discussed Falls evaluation completed Falls evaluation completed;Falls prevention discussed Falls prevention discussed -   FALL RISK PREVENTION PERTAINING TO THE HOME:  Any stairs in or around home? Yes  If so, do they handrails? Yes   Home free of loose throw rugs in walkways, pet beds, electrical cords, etc? Yes  Adequate lighting in your home to reduce risk of falls? Yes   ASSISTIVE DEVICES UTILIZED TO PREVENT FALLS:  Life alert? No  Use of a cane, walker  or w/c? No  Grab bars in the bathroom? No  Shower chair or bench in shower? No  Elevated toilet seat or a handicapped toilet? No   DME ORDERS:  DME order needed?  No   TIMED UP AND GO:  Was the test performed? No . Telephonic visit.   Education: Fall risk prevention has been discussed.  Intervention(s) required? No   Depression Screen PHQ 2/9 Scores 09/08/2019 02/25/2019 10/21/2018 09/09/2018  PHQ - 2 Score 0 0 0 0  PHQ- 9 Score - - - -     Cognitive Function     6CIT Screen 09/08/2019 09/07/2018 08/27/2017 08/21/2016  What Year? 0 points 0 points 0 points 0 points  What month? 0 points 0 points 0 points 0 points  What time? 0 points 0 points 0 points 0 points  Count back from 20 0 points 0 points 0 points 0 points  Months in reverse 0 points 0 points 0 points 0 points  Repeat phrase 0 points 0 points 0 points 0 points  Total Score 0 0 0 0    Immunization History  Administered Date(s) Administered  . Fluad Quad(high Dose 65+) 01/20/2019  . Influenza, High Dose Seasonal PF 02/02/2018  . Influenza,inj,Quad PF,6+ Mos 03/08/2015, 02/14/2016, 01/28/2017  . PFIZER SARS-COV-2 Vaccination 07/17/2019, 08/08/2019  . Pneumococcal Conjugate-13 08/10/2014  . Pneumococcal Polysaccharide-23 06/15/2013  . Tdap 05/28/2008, 10/28/2018  . Zoster 05/28/2012    Qualifies for Shingles Vaccine? Yes  Zostavax completed 2014. Due for second dose of Shingrix. Confirmed with pharmacist at [facility] in [place] needs second dose.   Tdap: Up to date  Flu Vaccine: Up to date  Pneumococcal Vaccine: Up to date   Screening Tests Health Maintenance  Topic Date Due  . MAMMOGRAM  10/28/2019  . INFLUENZA VACCINE  12/26/2019  . COLONOSCOPY  10/20/2023  . TETANUS/TDAP  10/27/2028  . DEXA SCAN  Completed  . PNA vac Low Risk Adult  Completed  . Hepatitis C Screening  Addressed    Cancer Screenings:  Colorectal Screening: Completed 10/19/13. Repeat every 10 years;  Mammogram: Completed 10/28/18.  Repeat every year;  Ordered today. Pt provided with contact information and advised to call to schedule appt.   Bone Density: Completed 09/09/17. Results reflect NORMAL.  Repeat every 2 years. Ordered today.  Pt provided with contact information and advised to call to schedule appt.   Lung Cancer Screening: (Low Dose CT Chest recommended if Age 56-80 years, 30 pack-year currently smoking OR have quit w/in 15years.) does not qualify.    Additional Screening:  Hepatitis C Screening: does qualify; Completed 08/16/15  Vision Screening: Recommended annual ophthalmology exams for early detection of glaucoma and other disorders of the eye. Is the patient up to date with their annual eye exam?  Yes  Who is the provider or what is the name of the office in which the pt attends annual eye exams? [facility]  Dental Screening: Recommended annual dental exams for proper oral hygiene  Community Resource Referral:  CRR required this visit?  No      Plan:     I have personally reviewed and addressed the Medicare Annual Wellness questionnaire and have noted the following in the patient's chart:  A. Medical and social history B. Use of alcohol, tobacco or illicit drugs  C. Current medications and supplements D. Functional ability and status E.  Nutritional status F.  Physical activity G. Advance directives H. List of other physicians I.  Hospitalizations, surgeries, and ER visits in previous 12 months J.  Vitals K. Screenings such as hearing and vision if needed, cognitive and depression L. Referrals and appointments   In addition, I have reviewed and discussed with patient certain preventive protocols, quality metrics, and best practice recommendations. A written personalized care plan for preventive services as well as general preventive health recommendations were provided to patient.   Signed,  Reather Littler, LPN Nurse Health Advisor   Nurse Notes: pt states d/c amlodipine 2-3  months ago due to leg swelling. She had went to cardiology earlier this year and BP was stable. Pt has f/u appt with Dr. Judithann Graves on 09/13/19.   Pt also states she would like to complete a hemoccult card as a precaution. Pt to discuss at next appt. Please notify patient at next appt due for second dose of Shingrix at [facility].

## 2019-09-08 NOTE — Patient Instructions (Signed)
Ms. Sarah Hernandez , Thank you for taking time to come for your Medicare Wellness Visit. I appreciate your ongoing commitment to your health goals. Please review the following plan we discussed and let me know if I can assist you in the future.   Screening recommendations/referrals: Colonoscopy: done 10/19/13. Repeat in 2025. Mammogram: done 10/28/18. Please call [facility] to schedule your mammogram and bone density screening. Bone Density: done 09/09/17 Recommended yearly ophthalmology/optometry visit for glaucoma screening and checkup Recommended yearly dental visit for hygiene and checkup  Vaccinations: Influenza vaccine: done 01/20/19 Pneumococcal vaccine: done 08/10/14 Tdap vaccine: done 10/28/18 Shingles vaccine: 1st dose 05/09/19 due for second dose Covid-19: done 07/17/19 & 08/08/19  Advanced directives: Please bring a copy of your health care power of attorney and living will to the office at your convenience.  Conditions/risks identified: Keep up the great work!  Next appointment: Please follow up in one year for your Medicare Annual Wellness visit.     Preventive Care 23 Years and Older, Female Preventive care refers to lifestyle choices and visits with your health care provider that can promote health and wellness. What does preventive care include?  A yearly physical exam. This is also called an annual well check.  Dental exams once or twice a year.  Routine eye exams. Ask your health care provider how often you should have your eyes checked.  Personal lifestyle choices, including:  Daily care of your teeth and gums.  Regular physical activity.  Eating a healthy diet.  Avoiding tobacco and drug use.  Limiting alcohol use.  Practicing safe sex.  Taking low-dose aspirin every day.  Taking vitamin and mineral supplements as recommended by your health care provider. What happens during an annual well check? The services and screenings done by your health care  provider during your annual well check will depend on your age, overall health, lifestyle risk factors, and family history of disease. Counseling  Your health care provider may ask you questions about your:  Alcohol use.  Tobacco use.  Drug use.  Emotional well-being.  Home and relationship well-being.  Sexual activity.  Eating habits.  History of falls.  Memory and ability to understand (cognition).  Work and work Astronomer.  Reproductive health. Screening  You may have the following tests or measurements:  Height, weight, and BMI.  Blood pressure.  Lipid and cholesterol levels. These may be checked every 5 years, or more frequently if you are over 65 years old.  Skin check.  Lung cancer screening. You may have this screening every year starting at age 52 if you have a 30-pack-year history of smoking and currently smoke or have quit within the past 15 years.  Fecal occult blood test (FOBT) of the stool. You may have this test every year starting at age 48.  Flexible sigmoidoscopy or colonoscopy. You may have a sigmoidoscopy every 5 years or a colonoscopy every 10 years starting at age 102.  Hepatitis C blood test.  Hepatitis B blood test.  Sexually transmitted disease (STD) testing.  Diabetes screening. This is done by checking your blood sugar (glucose) after you have not eaten for a while (fasting). You may have this done every 1-3 years.  Bone density scan. This is done to screen for osteoporosis. You may have this done starting at age 24.  Mammogram. This may be done every 1-2 years. Talk to your health care provider about how often you should have regular mammograms. Talk with your health care provider about your test  results, treatment options, and if necessary, the need for more tests. Vaccines  Your health care provider may recommend certain vaccines, such as:  Influenza vaccine. This is recommended every year.  Tetanus, diphtheria, and acellular  pertussis (Tdap, Td) vaccine. You may need a Td booster every 10 years.  Zoster vaccine. You may need this after age 43.  Pneumococcal 13-valent conjugate (PCV13) vaccine. One dose is recommended after age 75.  Pneumococcal polysaccharide (PPSV23) vaccine. One dose is recommended after age 62. Talk to your health care provider about which screenings and vaccines you need and how often you need them. This information is not intended to replace advice given to you by your health care provider. Make sure you discuss any questions you have with your health care provider. Document Released: 06/09/2015 Document Revised: 01/31/2016 Document Reviewed: 03/14/2015 Elsevier Interactive Patient Education  2017 [employer] Prevention in the Home Falls can cause injuries. They can happen to people of all ages. There are many things you can do to make your home safe and to help prevent falls. What can I do on the outside of my home?  Regularly fix the edges of walkways and driveways and fix any cracks.  Remove anything that might make you trip as you walk through a door, such as a raised step or threshold.  Trim any bushes or trees on the path to your home.  Use bright outdoor lighting.  Clear any walking paths of anything that might make someone trip, such as rocks or tools.  Regularly check to see if handrails are loose or broken. Make sure that both sides of any steps have handrails.  Any raised decks and porches should have guardrails on the edges.  Have any leaves, snow, or ice cleared regularly.  Use sand or salt on walking paths during winter.  Clean up any spills in your garage right away. This includes oil or grease spills. What can I do in the bathroom?  Use night lights.  Install grab bars by the toilet and in the tub and shower. Do not use towel bars as grab bars.  Use non-skid mats or decals in the tub or shower.  If you need to sit down in the shower, use a plastic,  non-slip stool.  Keep the floor dry. Clean up any water that spills on the floor as soon as it happens.  Remove soap buildup in the tub or shower regularly.  Attach bath mats securely with double-sided non-slip rug tape.  Do not have throw rugs and other things on the floor that can make you trip. What can I do in the bedroom?  Use night lights.  Make sure that you have a light by your bed that is easy to reach.  Do not use any sheets or blankets that are too big for your bed. They should not hang down onto the floor.  Have a firm chair that has side arms. You can use this for support while you get dressed.  Do not have throw rugs and other things on the floor that can make you trip. What can I do in the kitchen?  Clean up any spills right away.  Avoid walking on wet floors.  Keep items that you use a lot in easy-to-reach places.  If you need to reach something above you, use a strong step stool that has a grab bar.  Keep electrical cords out of the way.  Do not use floor polish or wax that makes  floors slippery. If you must use wax, use non-skid floor wax.  Do not have throw rugs and other things on the floor that can make you trip. What can I do with my stairs?  Do not leave any items on the stairs.  Make sure that there are handrails on both sides of the stairs and use them. Fix handrails that are broken or loose. Make sure that handrails are as long as the stairways.  Check any carpeting to make sure that it is firmly attached to the stairs. Fix any carpet that is loose or worn.  Avoid having throw rugs at the top or bottom of the stairs. If you do have throw rugs, attach them to the floor with carpet tape.  Make sure that you have a light switch at the top of the stairs and the bottom of the stairs. If you do not have them, ask someone to add them for you. What else can I do to help prevent falls?  Wear shoes that:  Do not have high heels.  Have rubber  bottoms.  Are comfortable and fit you well.  Are closed at the toe. Do not wear sandals.  If you use a stepladder:  Make sure that it is fully opened. Do not climb a closed stepladder.  Make sure that both sides of the stepladder are locked into place.  Ask someone to hold it for you, if possible.  Clearly mark and make sure that you can see:  Any grab bars or handrails.  First and last steps.  Where the edge of each step is.  Use tools that help you move around (mobility aids) if they are needed. These include:  Canes.  Walkers.  Scooters.  Crutches.  Turn on the lights when you go into a dark area. Replace any light bulbs as soon as they burn out.  Set up your furniture so you have a clear path. Avoid moving your furniture around.  If any of your floors are uneven, fix them.  If there are any pets around you, be aware of where they are.  Review your medicines with your doctor. Some medicines can make you feel dizzy. This can increase your chance of falling. Ask your doctor what other things that you can do to help prevent falls. This information is not intended to replace advice given to you by your health care provider. Make sure you discuss any questions you have with your health care provider. Document Released: 03/09/2009 Document Revised: 10/19/2015 Document Reviewed: 06/17/2014 Elsevier Interactive Patient Education  2017 [employer].

## 2019-09-10 ENCOUNTER — Encounter: Payer: Medicare PPO | Admitting: Internal Medicine

## 2019-09-10 ENCOUNTER — Other Ambulatory Visit: Payer: Self-pay | Admitting: Internal Medicine

## 2019-09-10 DIAGNOSIS — E782 Mixed hyperlipidemia: Secondary | ICD-10-CM

## 2019-09-13 ENCOUNTER — Telehealth: Payer: Self-pay

## 2019-09-13 ENCOUNTER — Other Ambulatory Visit: Payer: Self-pay

## 2019-09-13 ENCOUNTER — Ambulatory Visit (INDEPENDENT_AMBULATORY_CARE_PROVIDER_SITE_OTHER): Payer: Medicare PPO | Admitting: Internal Medicine

## 2019-09-13 ENCOUNTER — Encounter: Payer: Self-pay | Admitting: Internal Medicine

## 2019-09-13 VITALS — BP 150/92 | HR 75 | Temp 98.0°F | Ht 64.0 in | Wt 164.0 lb

## 2019-09-13 DIAGNOSIS — E782 Mixed hyperlipidemia: Secondary | ICD-10-CM | POA: Diagnosis not present

## 2019-09-13 DIAGNOSIS — K2100 Gastro-esophageal reflux disease with esophagitis, without bleeding: Secondary | ICD-10-CM | POA: Diagnosis not present

## 2019-09-13 DIAGNOSIS — Z780 Asymptomatic menopausal state: Secondary | ICD-10-CM

## 2019-09-13 DIAGNOSIS — F411 Generalized anxiety disorder: Secondary | ICD-10-CM

## 2019-09-13 DIAGNOSIS — Z1231 Encounter for screening mammogram for malignant neoplasm of breast: Secondary | ICD-10-CM | POA: Diagnosis not present

## 2019-09-13 DIAGNOSIS — Z85828 Personal history of other malignant neoplasm of skin: Secondary | ICD-10-CM | POA: Insufficient documentation

## 2019-09-13 DIAGNOSIS — I10 Essential (primary) hypertension: Secondary | ICD-10-CM

## 2019-09-13 DIAGNOSIS — Z0000 Encounter for general adult medical examination without abnormal findings: Secondary | ICD-10-CM

## 2019-09-13 HISTORY — DX: Personal history of other malignant neoplasm of skin: Z85.828

## 2019-09-13 LAB — POCT URINALYSIS DIPSTICK
Bilirubin, UA: NEGATIVE
Glucose, UA: NEGATIVE
Ketones, UA: NEGATIVE
Leukocytes, UA: NEGATIVE
Nitrite, UA: NEGATIVE
Protein, UA: POSITIVE — AB
Spec Grav, UA: 1.015 (ref 1.010–1.025)
Urobilinogen, UA: 0.2 E.U./dL
pH, UA: 6 (ref 5.0–8.0)

## 2019-09-13 MED ORDER — SERTRALINE HCL 50 MG PO TABS: 50.0000 mg | ORAL_TABLET | Freq: Every day | ORAL | 3 refills | Status: DC

## 2019-09-13 MED ORDER — LISINOPRIL 10 MG PO TABS: 10.0000 mg | ORAL_TABLET | Freq: Every day | ORAL | 3 refills | Status: DC

## 2019-09-13 MED ORDER — OMEPRAZOLE 20 MG PO CPDR: 20.0000 mg | DELAYED_RELEASE_CAPSULE | Freq: Every day | ORAL | 3 refills | Status: DC

## 2019-09-13 NOTE — Telephone Encounter (Signed)
Put order in your box to sign. Thank you.

## 2019-09-13 NOTE — Telephone Encounter (Signed)
Can you see about faxing an order?  I'll sign it.

## 2019-09-13 NOTE — Telephone Encounter (Signed)
Patient called saying she tried to schedule her mammogram and bone density with  imaging and they said that they only have a order for a mammogram. They cannot see an order for bone density.  Do we need to fax over a order for pt?  Please advise.

## 2019-09-13 NOTE — Progress Notes (Signed)
Date:  09/13/2019   Name:  Sarah Hernandez   DOB:  [birth year]   MRN:  [id number]   Chief Complaint: Annual Exam (Breast Exam. No pap- aged out.) Sarah Hernandez is a 69 y.o. female who presents today for her Complete Annual Exam. She feels well. She reports exercising walking daily and yard work. She reports she is sleeping well.   Mammogram  10/2018 DEXA  08/2017 repeat ordered Colonoscopy  09/2013 Immunization History  Administered Date(s) Administered  . Fluad Quad(high Dose 65+) 01/20/2019  . Influenza, High Dose Seasonal PF 02/02/2018  . Influenza,inj,Quad PF,6+ Mos 03/08/2015, 02/14/2016, 01/28/2017  . PFIZER SARS-COV-2 Vaccination 07/17/2019, 08/08/2019  . Pneumococcal Conjugate-13 08/10/2014  . Pneumococcal Polysaccharide-23 06/15/2013  . Tdap 05/28/2008, 10/28/2018  . Zoster 05/28/2012  . Zoster Recombinat (Shingrix) 05/09/2019    Hypertension This is a chronic problem. The problem is controlled. Associated symptoms include anxiety. Pertinent negatives include no chest pain, headaches, palpitations, peripheral edema or shortness of breath. Past treatments include beta blockers. The current treatment provides significant improvement.  Hyperlipidemia The problem is controlled. Pertinent negatives include no chest pain or shortness of breath. Current antihyperlipidemic treatment includes statins. The current treatment provides significant improvement of lipids. There are no compliance problems.   Gastroesophageal Reflux She reports no abdominal pain, no chest pain, no coughing or no wheezing. Pertinent negatives include no fatigue. She has tried a PPI for the symptoms. Past procedures include an EGD (no Barretts).  Anxiety Patient reports no chest pain, dizziness, nervous/anxious behavior, palpitations or shortness of breath.      Lab Results  Component Value Date   CREATININE 0.92 07/09/2019   BUN 17 07/09/2019   NA 137 07/09/2019   K 4.7 07/09/2019   CL 104  07/09/2019   CO2 24 07/09/2019   Lab Results  Component Value Date   CHOL 179 09/09/2018   HDL 46 09/09/2018   LDLCALC 94 09/09/2018   TRIG 195 (H) 09/09/2018   CHOLHDL 3.9 09/09/2018   Lab Results  Component Value Date   TSH 5.810 (H) 09/09/2018   No results found for: HGBA1C Lab Results  Component Value Date   WBC 8.7 07/09/2019   HGB 12.0 07/09/2019   HCT 37.1 07/09/2019   MCV 87.1 07/09/2019   PLT 289 07/09/2019   Lab Results  Component Value Date   ALT 22 07/09/2019   AST 25 07/09/2019   GGT 11 02/25/2019   ALKPHOS 113 07/09/2019   BILITOT 1.0 07/09/2019     Review of Systems  Constitutional: Negative for chills, fatigue and fever.  HENT: Negative for congestion, hearing loss, tinnitus, trouble swallowing and voice change.   Eyes: Negative for visual disturbance.  Respiratory: Negative for cough, chest tightness, shortness of breath and wheezing.   Cardiovascular: Negative for chest pain, palpitations and leg swelling.  Gastrointestinal: Negative for abdominal pain, constipation, diarrhea and vomiting.  Endocrine: Negative for polydipsia and polyuria.  Genitourinary: Negative for dysuria, frequency, genital sores, vaginal bleeding and vaginal discharge.  Musculoskeletal: Negative for arthralgias, gait problem and joint swelling.  Skin: Negative for color change and rash.  Neurological: Negative for dizziness, tremors, light-headedness and headaches.  Hematological: Negative for adenopathy. Does not bruise/bleed easily.  Psychiatric/Behavioral: Negative for dysphoric mood and sleep disturbance. The patient is not nervous/anxious.     Patient Active Problem List   Diagnosis Date Noted  . Generalized anxiety disorder 09/09/2018  . OAB (overactive bladder) 08/21/2016  . Esophagitis, reflux 10/05/2014  .  Hemorrhoids, internal 10/05/2014  . Hyperlipidemia, mixed 10/05/2014  . Benign hypertension 10/05/2014  . Irritable bowel syndrome with constipation  10/05/2014  . Mild intermittent asthma without complication 81/19/1478  . Allergic rhinitis, seasonal 10/05/2014  . DD (diverticular disease) 12/13/2013    No Known Allergies  Past Surgical History:  Procedure Laterality Date  . ABDOMINAL HYSTERECTOMY  1997  . APPENDECTOMY  1997  . COLONOSCOPY  2015   diverticulosis  . PARTIAL HYSTERECTOMY  1979   bleeding from fibroids  . TONSILLECTOMY  1966  . UPPER GASTROINTESTINAL ENDOSCOPY  09/2014   gastritis/esoph; no Barrett's    Social History   Tobacco Use  . Smoking status: Never Smoker  . Smokeless tobacco: Never Used  . Tobacco comment: smoking cessation materials not required  Substance Use Topics  . Alcohol use: Yes    Alcohol/week: 2.0 standard drinks    Types: 2 Standard drinks or equivalent per week    Comment: Occasionally  . Drug use: No     Medication list has been reviewed and updated.  Current Meds  Medication Sig  . albuterol (PROVENTIL) (2.5 MG/3ML) 0.083% nebulizer solution Inhale 3 mLs into the lungs 4 (four) times daily as needed. Reported on 08/16/2015  . atorvastatin (LIPITOR) 40 MG tablet TAKE 1 TABLET BY MOUTH EVERYDAY AT BEDTIME  . hyoscyamine (LEVSIN, ANASPAZ) 0.125 MG tablet Take 1 tablet (0.125 mg total) by mouth every 4 (four) hours as needed.  . metoprolol succinate (TOPROL-XL) 25 MG 24 hr tablet Take by mouth.  . nitroGLYCERIN (NITROSTAT) 0.4 MG SL tablet Place 1 tablet (0.4 mg total) under the tongue every 5 (five) minutes as needed for chest pain.  Marland Kitchen omeprazole (PRILOSEC) 20 MG capsule Take 1 capsule (20 mg total) by mouth daily.  . sertraline (ZOLOFT) 50 MG tablet Take 1 tablet (50 mg total) by mouth daily.    PHQ 2/9 Scores 09/13/2019 09/08/2019 02/25/2019 10/21/2018  PHQ - 2 Score 0 0 0 0  PHQ- 9 Score 0 - - -    BP Readings from Last 3 Encounters:  09/13/19 (!) 150/92  07/09/19 (!) 196/87  02/25/19 124/74    Physical Exam Vitals and nursing note reviewed.  Constitutional:       General: She is not in acute distress.    Appearance: She is well-developed.  HENT:     Head: Normocephalic and atraumatic.     Right Ear: Tympanic membrane and ear canal normal.     Left Ear: Tympanic membrane and ear canal normal.     Nose:     Right Sinus: No maxillary sinus tenderness.     Left Sinus: No maxillary sinus tenderness.  Eyes:     General: No scleral icterus.       Right eye: No discharge.        Left eye: No discharge.     Conjunctiva/sclera: Conjunctivae normal.  Neck:     Thyroid: No thyromegaly.     Vascular: No carotid bruit.  Cardiovascular:     Rate and Rhythm: Normal rate and regular rhythm.     Pulses: Normal pulses.     Heart sounds: Normal heart sounds.  Pulmonary:     Effort: Pulmonary effort is normal. No respiratory distress.     Breath sounds: No wheezing.  Chest:     Breasts:        Right: No mass, nipple discharge, skin change or tenderness.        Left: No mass, nipple discharge, skin  change or tenderness.  Abdominal:     General: Bowel sounds are normal.     Palpations: Abdomen is soft.     Tenderness: There is no abdominal tenderness.  Musculoskeletal:        General: Normal range of motion.     Cervical back: Normal range of motion. No erythema.  Lymphadenopathy:     Cervical: No cervical adenopathy.  Skin:    General: Skin is warm and dry.     Findings: No rash.  Neurological:     Mental Status: She is alert and oriented to person, place, and time.     Cranial Nerves: No cranial nerve deficit.     Sensory: No sensory deficit.     Deep Tendon Reflexes: Reflexes are normal and symmetric.  Psychiatric:        Speech: Speech normal.        Behavior: Behavior normal.        Thought Content: Thought content normal.     Wt Readings from Last 3 Encounters:  09/13/19 164 lb (74.4 kg)  07/09/19 161 lb (73 kg)  02/25/19 163 lb 12.8 oz (74.3 kg)    BP (!) 150/92   Pulse 75   Temp 98 F (36.7 C) (Oral)   Ht 5\' 4"  (1.626 m)   Wt  164 lb (74.4 kg)   SpO2 97%   BMI 28.15 kg/m   Assessment and Plan: 1. Annual physical exam Normal exam Continue healthy diet, regular exercise - POCT urinalysis dipstick  2. Encounter for screening mammogram for breast cancer To be scheduled at [facility]  3. Benign hypertension Not controlled on beta blocker alone Will add low dose ACE and recheck in 2 months - CBC with Differential/Platelet - Comprehensive metabolic panel - TSH - lisinopril (ZESTRIL) 10 MG tablet; Take 1 tablet (10 mg total) by mouth daily.  Dispense: 90 tablet; Refill: 3  4. Hyperlipidemia, mixed Tolerating statin medication without side effects at this time Continue same therapy without change at this time. - Comprehensive metabolic panel - Lipid panel  5. Generalized anxiety disorder Doing very well on current regimen - will continue - sertraline (ZOLOFT) 50 MG tablet; Take 1 tablet (50 mg total) by mouth daily.  Dispense: 90 tablet; Refill: 3  6. Gastroesophageal reflux disease with esophagitis, unspecified whether hemorrhage Symptoms well controlled on daily PPI No red flag signs such as weight loss, n/v, melena Will continue daily omeprazole. - CBC with Differential/Platelet - omeprazole (PRILOSEC) 20 MG capsule; Take 1 capsule (20 mg total) by mouth daily.  Dispense: 90 capsule; Refill: 3  7. History of basal cell cancer   Partially dictated using [facility]. Any errors are unintentional.  Animal nutritionist, MD [facility]  09/13/2019

## 2019-09-14 LAB — CBC WITH DIFFERENTIAL/PLATELET
Basophils Absolute: 0 10*3/uL (ref 0.0–0.2)
Basos: 1 %
EOS (ABSOLUTE): 0.3 10*3/uL (ref 0.0–0.4)
Eos: 4 %
Hematocrit: 35.9 % (ref 34.0–46.6)
Hemoglobin: 11.9 g/dL (ref 11.1–15.9)
Immature Grans (Abs): 0 10*3/uL (ref 0.0–0.1)
Immature Granulocytes: 0 %
Lymphocytes Absolute: 1.8 10*3/uL (ref 0.7–3.1)
Lymphs: 26 %
MCH: 29 pg (ref 26.6–33.0)
MCHC: 33.1 g/dL (ref 31.5–35.7)
MCV: 87 fL (ref 79–97)
Monocytes Absolute: 0.4 10*3/uL (ref 0.1–0.9)
Monocytes: 6 %
Neutrophils Absolute: 4.3 10*3/uL (ref 1.4–7.0)
Neutrophils: 63 %
Platelets: 283 10*3/uL (ref 150–450)
RBC: 4.11 x10E6/uL (ref 3.77–5.28)
RDW: 13.6 % (ref 11.7–15.4)
WBC: 6.9 10*3/uL (ref 3.4–10.8)

## 2019-09-14 LAB — COMPREHENSIVE METABOLIC PANEL
ALT: 17 IU/L (ref 0–32)
AST: 17 IU/L (ref 0–40)
Albumin/Globulin Ratio: 1.4 (ref 1.2–2.2)
Albumin: 4 g/dL (ref 3.7–4.7)
Alkaline Phosphatase: 129 IU/L — ABNORMAL HIGH (ref 39–117)
BUN/Creatinine Ratio: 14 (ref 12–28)
BUN: 13 mg/dL (ref 8–27)
Bilirubin Total: 0.2 mg/dL (ref 0.0–1.2)
CO2: 24 mmol/L (ref 20–29)
Calcium: 9 mg/dL (ref 8.7–10.3)
Chloride: 104 mmol/L (ref 96–106)
Creatinine, Ser: 0.95 mg/dL (ref 0.57–1.00)
GFR calc Af Amer: 70 mL/min/{1.73_m2} (ref >59)
GFR calc non Af Amer: 60 mL/min/{1.73_m2} (ref >59)
Globulin, Total: 2.8 g/dL (ref 1.5–4.5)
Glucose: 91 mg/dL (ref 65–99)
Potassium: 4.1 mmol/L (ref 3.5–5.2)
Sodium: 142 mmol/L (ref 134–144)
Total Protein: 6.8 g/dL (ref 6.0–8.5)

## 2019-09-14 LAB — LIPID PANEL
Chol/HDL Ratio: 3.2 ratio (ref 0.0–4.4)
Cholesterol, Total: 155 mg/dL (ref 100–199)
HDL: 49 mg/dL (ref >39)
LDL Chol Calc (NIH): 87 mg/dL (ref 0–99)
Triglycerides: 101 mg/dL (ref 0–149)
VLDL Cholesterol Cal: 19 mg/dL (ref 5–40)

## 2019-09-14 LAB — TSH: TSH: 4.56 u[IU]/mL — ABNORMAL HIGH (ref 0.450–4.500)

## 2019-11-01 DIAGNOSIS — Z1231 Encounter for screening mammogram for malignant neoplasm of breast: Secondary | ICD-10-CM | POA: Diagnosis not present

## 2019-11-01 DIAGNOSIS — Z780 Asymptomatic menopausal state: Secondary | ICD-10-CM | POA: Diagnosis not present

## 2019-11-02 ENCOUNTER — Telehealth: Payer: Self-pay | Admitting: Internal Medicine

## 2019-11-02 NOTE — Telephone Encounter (Signed)
Normal bone density at the spine.  Osteopenia at the hip with no change from 2019.

## 2019-11-02 NOTE — Telephone Encounter (Signed)
Tried calling pt about bone density results. Awaiting pts call back. OK for [facility] to give results of Bone Density when she calls back. See previous note with Dr Karn Cassis msg.  CM

## 2019-11-04 NOTE — Telephone Encounter (Signed)
Pt given results per Dr Bari Edward," Normal bone density at the spine.  Osteopenia at the hip with no change from 2019"; she verbalized understanding.Marland Kitchen

## 2019-11-24 ENCOUNTER — Other Ambulatory Visit: Payer: Self-pay

## 2019-11-24 ENCOUNTER — Encounter: Payer: Self-pay | Admitting: Internal Medicine

## 2019-11-24 ENCOUNTER — Ambulatory Visit (INDEPENDENT_AMBULATORY_CARE_PROVIDER_SITE_OTHER): Payer: Medicare PPO | Admitting: Internal Medicine

## 2019-11-24 VITALS — BP 126/74 | HR 68 | Temp 97.9°F | Ht 64.0 in | Wt 159.0 lb

## 2019-11-24 DIAGNOSIS — I10 Essential (primary) hypertension: Secondary | ICD-10-CM | POA: Diagnosis not present

## 2019-11-24 MED ORDER — LISINOPRIL 20 MG PO TABS: 20.0000 mg | ORAL_TABLET | Freq: Every day | ORAL | 1 refills | Status: DC

## 2019-11-24 NOTE — Progress Notes (Signed)
Date:  11/24/2019   Name:  Sarah Hernandez   DOB:  1951/03/17   MRN:  [id number]   Chief Complaint: Hypertension (Follow up.)  Hypertension This is a chronic problem. The problem has been gradually improving since onset. The problem is controlled. Pertinent negatives include no chest pain, headaches, palpitations or shortness of breath. There are no associated agents to hypertension. There are no known risk factors for coronary artery disease. Past treatments include beta blockers and ACE inhibitors (lisinopril added last visit). The current treatment provides significant improvement. There are no compliance problems.     Lab Results  Component Value Date   CREATININE 0.95 09/13/2019   BUN 13 09/13/2019   NA 142 09/13/2019   K 4.1 09/13/2019   CL 104 09/13/2019   CO2 24 09/13/2019   Lab Results  Component Value Date   CHOL 155 09/13/2019   HDL 49 09/13/2019   LDLCALC 87 09/13/2019   TRIG 101 09/13/2019   CHOLHDL 3.2 09/13/2019   Lab Results  Component Value Date   TSH 4.560 (H) 09/13/2019   No results found for: HGBA1C Lab Results  Component Value Date   WBC 6.9 09/13/2019   HGB 11.9 09/13/2019   HCT 35.9 09/13/2019   MCV 87 09/13/2019   PLT 283 09/13/2019   Lab Results  Component Value Date   ALT 17 09/13/2019   AST 17 09/13/2019   GGT 11 02/25/2019   ALKPHOS 129 (H) 09/13/2019   BILITOT 0.2 09/13/2019     Review of Systems  Constitutional: Negative for appetite change, fatigue, fever and unexpected weight change.  HENT: Negative for tinnitus.   Eyes: Negative for visual disturbance.  Respiratory: Negative for cough, chest tightness, shortness of breath and wheezing.   Cardiovascular: Negative for chest pain, palpitations and leg swelling.  Gastrointestinal: Negative for abdominal pain, constipation and diarrhea.  Musculoskeletal: Negative for arthralgias.  Skin: Negative for rash.  Neurological: Negative for dizziness, numbness and headaches.    Psychiatric/Behavioral: Negative for dysphoric mood.    Patient Active Problem List   Diagnosis Date Noted  . History of basal cell cancer 09/13/2019  . Generalized anxiety disorder 09/09/2018  . OAB (overactive bladder) 08/21/2016  . Esophagitis, reflux 10/05/2014  . Hemorrhoids, internal 10/05/2014  . Hyperlipidemia, mixed 10/05/2014  . Benign hypertension 10/05/2014  . Irritable bowel syndrome with constipation 10/05/2014  . Mild intermittent asthma without complication 10/05/2014  . Allergic rhinitis, seasonal 10/05/2014  . DD (diverticular disease) 12/13/2013    No Known Allergies  Past Surgical History:  Procedure Laterality Date  . ABDOMINAL HYSTERECTOMY  1997  . APPENDECTOMY  1997  . COLONOSCOPY  2015   diverticulosis  . PARTIAL HYSTERECTOMY  1979   bleeding from fibroids  . TONSILLECTOMY  1966  . UPPER GASTROINTESTINAL ENDOSCOPY  09/2014   gastritis/esoph; no Barrett's    Social History   Tobacco Use  . Smoking status: Never Smoker  . Smokeless tobacco: Never Used  . Tobacco comment: smoking cessation materials not required  Vaping Use  . Vaping Use: Never used  Substance Use Topics  . Alcohol use: Yes    Alcohol/week: 2.0 standard drinks    Types: 2 Standard drinks or equivalent per week    Comment: Occasionally  . Drug use: No     Medication list has been reviewed and updated.  Current Meds  Medication Sig  . albuterol (PROVENTIL) (2.5 MG/3ML) 0.083% nebulizer solution Inhale 3 mLs into the lungs 4 (four) times  daily as needed. Reported on 08/16/2015  . atorvastatin (LIPITOR) 40 MG tablet TAKE 1 TABLET BY MOUTH EVERYDAY AT BEDTIME  . hyoscyamine (LEVSIN, ANASPAZ) 0.125 MG tablet Take 1 tablet (0.125 mg total) by mouth every 4 (four) hours as needed.  Marland Kitchen lisinopril (ZESTRIL) 10 MG tablet Take 1 tablet (10 mg total) by mouth daily.  Marland Kitchen omeprazole (PRILOSEC) 20 MG capsule Take 1 capsule (20 mg total) by mouth daily.  . sertraline (ZOLOFT) 50 MG tablet  Take 1 tablet (50 mg total) by mouth daily.    PHQ 2/9 Scores 11/24/2019 09/13/2019 09/08/2019 02/25/2019  PHQ - 2 Score 0 0 0 0  PHQ- 9 Score 0 0 - -    GAD 7 : Generalized Anxiety Score 11/24/2019 09/13/2019  Nervous, Anxious, on Edge 0 0  Control/stop worrying 0 0  Worry too much - different things 0 0  Trouble relaxing 0 0  Restless 0 0  Easily annoyed or irritable 0 0  Afraid - awful might happen 0 0  Total GAD 7 Score 0 0  Anxiety Difficulty Not difficult at all Not difficult at all    BP Readings from Last 3 Encounters:  11/24/19 126/74  09/13/19 (!) 150/92  07/09/19 (!) 196/87    Physical Exam Vitals and nursing note reviewed.  Constitutional:      General: She is not in acute distress.    Appearance: She is well-developed.  HENT:     Head: Normocephalic and atraumatic.  Cardiovascular:     Rate and Rhythm: Normal rate and regular rhythm.     Pulses: Normal pulses.     Heart sounds: No murmur heard.   Pulmonary:     Effort: Pulmonary effort is normal. No respiratory distress.  Musculoskeletal:        General: Normal range of motion.     Cervical back: Normal range of motion.     Right lower leg: No edema.     Left lower leg: No edema.  Lymphadenopathy:     Cervical: No cervical adenopathy.  Skin:    General: Skin is warm and dry.     Findings: No rash.  Neurological:     General: No focal deficit present.     Mental Status: She is alert and oriented to person, place, and time.     Wt Readings from Last 3 Encounters:  11/24/19 159 lb (72.1 kg)  09/13/19 164 lb (74.4 kg)  07/09/19 161 lb (73 kg)   Right 118/60   Left 134/64 BP 126/74 (BP Location: Right Arm, Patient Position: Sitting, Cuff Size: Normal)   Pulse 68   Temp 97.9 F (36.6 C) (Oral)   Ht 5\' 4"  (1.626 m)   Wt 159 lb (72.1 kg)   SpO2 98%   BMI 27.29 kg/m   Assessment and Plan: 1. Benign hypertension BP is improving with the addition of ACEI. She is having no side effects.  BP at  home still slightly elevated. Mild R to L discrepancy on readings today but pulses are equal in both UE Increase lisinopril to 20 mg per day Recheck in 8 weeks Pt to bring home cuff to next visit - lisinopril (ZESTRIL) 20 MG tablet; Take 1 tablet (20 mg total) by mouth daily.  Dispense: 90 tablet; Refill: 1   Partially dictated using . Any errors are unintentional.  Animal nutritionist, MD [facility]  11/24/2019

## 2020-01-06 ENCOUNTER — Encounter: Payer: Self-pay | Admitting: Internal Medicine

## 2020-01-30 ENCOUNTER — Encounter: Payer: Self-pay | Admitting: Internal Medicine

## 2020-02-16 ENCOUNTER — Encounter: Payer: Self-pay | Admitting: Internal Medicine

## 2020-02-16 ENCOUNTER — Other Ambulatory Visit: Payer: Self-pay

## 2020-02-16 ENCOUNTER — Ambulatory Visit: Payer: Medicare PPO | Admitting: Internal Medicine

## 2020-02-16 VITALS — BP 138/80 | HR 83 | Temp 98.1°F | Ht 64.0 in | Wt 158.0 lb

## 2020-02-16 DIAGNOSIS — I10 Essential (primary) hypertension: Secondary | ICD-10-CM

## 2020-02-16 DIAGNOSIS — Z23 Encounter for immunization: Secondary | ICD-10-CM

## 2020-02-16 NOTE — Progress Notes (Signed)
Date:  02/16/2020   Name:  Sarah Hernandez   DOB:  03-29-1951   MRN:  [id number]   Chief Complaint: Hypertension and Immunizations (High dose)  Hypertension This is a chronic problem. The problem is controlled. Pertinent negatives include no chest pain, headaches, palpitations or shortness of breath. Past treatments include beta blockers and ACE inhibitors. The current treatment provides significant improvement.  Blood pressure was slightly elevated this AM the dentist so she purchased a wrist cuff that she would like to correlate today.  Lab Results  Component Value Date   CREATININE 0.95 09/13/2019   BUN 13 09/13/2019   NA 142 09/13/2019   K 4.1 09/13/2019   CL 104 09/13/2019   CO2 24 09/13/2019   Lab Results  Component Value Date   CHOL 155 09/13/2019   HDL 49 09/13/2019   LDLCALC 87 09/13/2019   TRIG 101 09/13/2019   CHOLHDL 3.2 09/13/2019   Lab Results  Component Value Date   TSH 4.560 (H) 09/13/2019   No results found for: HGBA1C Lab Results  Component Value Date   WBC 6.9 09/13/2019   HGB 11.9 09/13/2019   HCT 35.9 09/13/2019   MCV 87 09/13/2019   PLT 283 09/13/2019   Lab Results  Component Value Date   ALT 17 09/13/2019   AST 17 09/13/2019   GGT 11 02/25/2019   ALKPHOS 129 (H) 09/13/2019   BILITOT 0.2 09/13/2019     Review of Systems  Constitutional: Negative for appetite change, fatigue, fever and unexpected weight change.  HENT: Negative for tinnitus and trouble swallowing.   Eyes: Negative for visual disturbance.  Respiratory: Negative for cough, chest tightness and shortness of breath.   Cardiovascular: Negative for chest pain, palpitations and leg swelling.  Gastrointestinal: Negative for abdominal pain.  Endocrine: Negative for polydipsia.  Genitourinary: Negative for dysuria and hematuria.  Musculoskeletal: Negative for arthralgias.  Neurological: Negative for tremors, numbness and headaches.  Psychiatric/Behavioral: Negative for  dysphoric mood and sleep disturbance.    Patient Active Problem List   Diagnosis Date Noted  . History of basal cell cancer 09/13/2019  . Generalized anxiety disorder 09/09/2018  . OAB (overactive bladder) 08/21/2016  . Esophagitis, reflux 10/05/2014  . Hemorrhoids, internal 10/05/2014  . Hyperlipidemia, mixed 10/05/2014  . Benign hypertension 10/05/2014  . Irritable bowel syndrome with constipation 10/05/2014  . Mild intermittent asthma without complication 10/05/2014  . Allergic rhinitis, seasonal 10/05/2014  . DD (diverticular disease) 12/13/2013    Allergies  Allergen Reactions  . Amlodipine Swelling    Past Surgical History:  Procedure Laterality Date  . ABDOMINAL HYSTERECTOMY  1997  . APPENDECTOMY  1997  . COLONOSCOPY  2015   diverticulosis  . PARTIAL HYSTERECTOMY  1979   bleeding from fibroids  . TONSILLECTOMY  1966  . UPPER GASTROINTESTINAL ENDOSCOPY  09/2014   gastritis/esoph; no Barrett's    Social History   Tobacco Use  . Smoking status: Never Smoker  . Smokeless tobacco: Never Used  . Tobacco comment: smoking cessation materials not required  Vaping Use  . Vaping Use: Never used  Substance Use Topics  . Alcohol use: Yes    Alcohol/week: 2.0 standard drinks    Types: 2 Standard drinks or equivalent per week    Comment: Occasionally  . Drug use: No     Medication list has been reviewed and updated.  Current Meds  Medication Sig  . albuterol (PROVENTIL) (2.5 MG/3ML) 0.083% nebulizer solution Inhale 3 mLs into the lungs  4 (four) times daily as needed. Reported on 08/16/2015  . atorvastatin (LIPITOR) 40 MG tablet TAKE 1 TABLET BY MOUTH EVERYDAY AT BEDTIME  . hyoscyamine (LEVSIN, ANASPAZ) 0.125 MG tablet Take 1 tablet (0.125 mg total) by mouth every 4 (four) hours as needed.  Marland Kitchen lisinopril (ZESTRIL) 20 MG tablet Take 1 tablet (20 mg total) by mouth daily.  . metoprolol succinate (TOPROL-XL) 25 MG 24 hr tablet Take by mouth.  . nitroGLYCERIN (NITROSTAT)  0.4 MG SL tablet Place 1 tablet (0.4 mg total) under the tongue every 5 (five) minutes as needed for chest pain.  Marland Kitchen omeprazole (PRILOSEC) 20 MG capsule Take 1 capsule (20 mg total) by mouth daily.  . sertraline (ZOLOFT) 50 MG tablet Take 1 tablet (50 mg total) by mouth daily.    PHQ 2/9 Scores 02/16/2020 11/24/2019 09/13/2019 09/08/2019  PHQ - 2 Score 0 0 0 0  PHQ- 9 Score 0 0 0 -    GAD 7 : Generalized Anxiety Score 02/16/2020 11/24/2019 09/13/2019  Nervous, Anxious, on Edge 0 0 0  Control/stop worrying 0 0 0  Worry too much - different things 0 0 0  Trouble relaxing 0 0 0  Restless 0 0 0  Easily annoyed or irritable 0 0 0  Afraid - awful might happen 0 0 0  Total GAD 7 Score 0 0 0  Anxiety Difficulty Not difficult at all Not difficult at all Not difficult at all    BP Readings from Last 3 Encounters:  02/16/20 138/80  11/24/19 126/74  09/13/19 (!) 150/92    Physical Exam Vitals and nursing note reviewed.  Constitutional:      General: She is not in acute distress.    Appearance: She is well-developed.  HENT:     Head: Normocephalic and atraumatic.  Cardiovascular:     Rate and Rhythm: Normal rate and regular rhythm.     Pulses: Normal pulses.     Heart sounds: No murmur heard.   Pulmonary:     Effort: Pulmonary effort is normal. No respiratory distress.     Breath sounds: No wheezing or rhonchi.  Musculoskeletal:     Right lower leg: No edema.     Left lower leg: No edema.  Skin:    General: Skin is warm and dry.     Capillary Refill: Capillary refill takes less than 2 seconds.     Findings: No rash.  Neurological:     General: No focal deficit present.     Mental Status: She is alert and oriented to person, place, and time.  Psychiatric:        Mood and Affect: Mood normal.     Wt Readings from Last 3 Encounters:  02/16/20 158 lb (71.7 kg)  11/24/19 159 lb (72.1 kg)  09/13/19 164 lb (74.4 kg)    BP 138/80   Pulse 83   Temp 98.1 F (36.7 C) (Oral)   Ht  5\' 4"  (1.626 m)   Wt 158 lb (71.7 kg)   SpO2 97%   BMI 27.12 kg/m   Assessment and Plan: 1. Benign hypertension Clinically stable exam with well controlled BP. Tolerating medications without side effects at this time. Pt to continue current regimen and low sodium diet; benefits of regular exercise as able discussed. Correlated wrist cuff with manual and the wrist device is consistently 10 points higher.  2. Need for immunization against influenza - Flu Vaccine QUAD High Dose(Fluad)   Partially dictated using . Any errors are  unintentional.  Bari Edward, MD [facility]  02/16/2020

## 2020-04-04 ENCOUNTER — Ambulatory Visit: Payer: Self-pay | Admitting: *Deleted

## 2020-04-04 NOTE — Telephone Encounter (Signed)
C/o chest discomfort and productive cough for several days. C/o SOB at times, with pain in right side of back due to coughing. Denies fever but c/o chills prior to today. C/o hacking cough with white to yellow productive cough. Hx pneumonia in the past. Denies difficulty breathing but reports SOB comes and goes. Noted wheezing at times. Reports runny nose, headache past few days. Unable to lay flat, woke up in middle of night and chest doesn't "feel right'. Poor appetite but is drinking fluids. Reports she has had vaccinations and Pfizer booster shot. Attempted to schedule appt and covid testing. Patient would like to get recommendation from PCP. 1st available appt is 04/11/20 virtual. Encouraged patient to go to UC and patient requesting to hear from PCP first. Care advise given. Patient verbalized understanding of care advise and to call back or go to [facility] or ED if symptoms worsen. Please advise for earlier appt.   Reason for Disposition  [1] MILD difficulty breathing (e.g., minimal/no SOB at rest, SOB with walking, pulse <100) AND [2] still present when not coughing  Answer Assessment - Initial Assessment Questions 1. ONSET: "When did the cough begin?"      Several days ago  2. SEVERITY: "How bad is the cough today?"      Getting worse. Constant coughing  3. SPUTUM: "Describe the color of your sputum" (none, dry cough; clear, white, yellow, green)     White to yellow in color  4. HEMOPTYSIS: "Are you coughing up any blood?" If so ask: "How much?" (flecks, streaks, tablespoons, etc.)     no 5. DIFFICULTY BREATHING: "Are you having difficulty breathing?" If Yes, ask: "How bad is it?" (e.g., mild, moderate, severe)    - MILD: No SOB at rest, mild SOB with walking, speaks normally in sentences, can lay down, no retractions, pulse < 100.    - MODERATE: SOB at rest, SOB with minimal exertion and prefers to sit, cannot lie down flat, speaks in phrases, mild retractions, audible wheezing, pulse 100-120.     - SEVERE: Very SOB at rest, speaks in single words, struggling to breathe, sitting hunched forward, retractions, pulse > 120      Moderate  6. FEVER: "Do you have a fever?" If Yes, ask: "What is your temperature, how was it measured, and when did it start?"     Not sure but did have chills  7. CARDIAC HISTORY: "Do you have any history of heart disease?" (e.g., heart attack, congestive heart failure)      Hx angina  8. LUNG HISTORY: "Do you have any history of lung disease?"  (e.g., pulmonary embolus, asthma, emphysema)     Hx pneumonia  9. PE RISK FACTORS: "Do you have a history of blood clots?" (or: recent major surgery, recent prolonged travel, bedridden)     Na  10. OTHER SYMPTOMS: "Do you have any other symptoms?" (e.g., runny nose, wheezing, chest pain)       Runny nose, productive  Hacking cough, wheezing at times, headache, poor appetite 11. PREGNANCY: "Is there any chance you are pregnant?" "When was your last menstrual period?"       na 12. TRAVEL: "Have you traveled out of the country in the last month?" (e.g., travel history, exposures)       na  Protocols used: COUGH - ACUTE PRODUCTIVE-A-AH

## 2020-04-04 NOTE — Telephone Encounter (Signed)
Called pt told her to go to the ER or UC for chest pain/ discomfort and SOB. Pt verbalized understanding.  KP

## 2020-04-24 DIAGNOSIS — M71341 Other bursal cyst, right hand: Secondary | ICD-10-CM | POA: Diagnosis not present

## 2020-05-08 ENCOUNTER — Other Ambulatory Visit: Payer: Self-pay | Admitting: Internal Medicine

## 2020-05-08 DIAGNOSIS — I10 Essential (primary) hypertension: Secondary | ICD-10-CM

## 2020-05-08 NOTE — Telephone Encounter (Signed)
Requested Prescriptions  Pending Prescriptions Disp Refills  . lisinopril (ZESTRIL) 20 MG tablet [Pharmacy Med Name: LISINOPRIL 20 MG TABLET] 90 tablet 1    Sig: TAKE 1 TABLET BY MOUTH EVERY DAY     Cardiovascular:  ACE Inhibitors Failed - 05/08/2020 10:37 AM      Failed - Cr in normal range and within 180 days    Creatinine, Ser  Date Value Ref Range Status  09/13/2019 0.95 0.57 - 1.00 mg/dL Final         Failed - K in normal range and within 180 days    Potassium  Date Value Ref Range Status  09/13/2019 4.1 3.5 - 5.2 mmol/L Final         Passed - Patient is not pregnant      Passed - Last BP in normal range    BP Readings from Last 1 Encounters:  02/16/20 138/80         Passed - Valid encounter within last 6 months    Recent Outpatient Visits          2 months ago Benign hypertension   [facility] Reubin Milan, MD   5 months ago Benign hypertension   [facility] Reubin Milan, MD   7 months ago Annual physical exam   [facility] Reubin Milan, MD   1 year ago Benign hypertension   [facility] Reubin Milan, MD   1 year ago Need for diphtheria-tetanus-pertussis (Tdap) vaccine   [facility] Reubin Milan, MD      Future Appointments            In 4 months Judithann Graves Nyoka Cowden, MD [facility], [facility]

## 2020-06-06 ENCOUNTER — Encounter: Payer: Self-pay | Admitting: Internal Medicine

## 2020-06-07 ENCOUNTER — Other Ambulatory Visit: Payer: Self-pay | Admitting: Internal Medicine

## 2020-06-07 DIAGNOSIS — J4520 Mild intermittent asthma, uncomplicated: Secondary | ICD-10-CM

## 2020-06-07 MED ORDER — ALBUTEROL SULFATE (2.5 MG/3ML) 0.083% IN NEBU: 3.0000 mL | INHALATION_SOLUTION | Freq: Four times a day (QID) | RESPIRATORY_TRACT | 3 refills | Status: AC | PRN

## 2020-06-07 MED ORDER — ALBUTEROL SULFATE HFA 108 (90 BASE) MCG/ACT IN AERS: 2.0000 | INHALATION_SPRAY | Freq: Four times a day (QID) | RESPIRATORY_TRACT | 0 refills | Status: DC | PRN

## 2020-06-07 NOTE — Telephone Encounter (Signed)
Please call pt to schedule an appt. Sarah Hernandez.  KP

## 2020-06-07 NOTE — Telephone Encounter (Signed)
Please review. Pt wants solution for nebulizer and inhaler.  KP

## 2020-06-08 ENCOUNTER — Ambulatory Visit: Payer: Medicare PPO | Admitting: Internal Medicine

## 2020-06-08 ENCOUNTER — Encounter: Payer: Self-pay | Admitting: Internal Medicine

## 2020-06-08 VITALS — BP 146/76 | HR 86 | Temp 98.0°F | Ht 64.0 in | Wt 158.0 lb

## 2020-06-08 DIAGNOSIS — R059 Cough, unspecified: Secondary | ICD-10-CM

## 2020-06-08 DIAGNOSIS — J4521 Mild intermittent asthma with (acute) exacerbation: Secondary | ICD-10-CM | POA: Diagnosis not present

## 2020-06-08 MED ORDER — GUAIFENESIN-CODEINE 100-10 MG/5ML PO SYRP: 5.0000 mL | ORAL_SOLUTION | Freq: Three times a day (TID) | ORAL | 0 refills | Status: DC | PRN

## 2020-06-08 MED ORDER — PREDNISONE 10 MG PO TABS: 10.0000 mg | ORAL_TABLET | ORAL | 0 refills | Status: AC

## 2020-06-08 MED ORDER — CEFDINIR 300 MG PO CAPS: 300.0000 mg | ORAL_CAPSULE | Freq: Two times a day (BID) | ORAL | 0 refills | Status: AC

## 2020-06-08 NOTE — Progress Notes (Signed)
Date:  06/08/2020   Name:  Sarah Hernandez   DOB:  [birth year]   MRN:  [id number]   Chief Complaint: Cough (X2.5 weeks got better and came back, Wheezing, SOB, head and chest congested, home covid test negative, no fever, 06/03/2019.)  Cough This is a new problem. The current episode started 1 to 4 weeks ago. The problem has been waxing and waning. The problem occurs hourly. The cough is non-productive. Associated symptoms include chest pain, nasal congestion, shortness of breath and wheezing. Pertinent negatives include no chills, fever, headaches, postnasal drip or sore throat. The symptoms are aggravated by exercise.   She is fully vaccinated. She has had 2 negative home Covid tests in the past 10 days.  Lab Results  Component Value Date   CREATININE 0.95 09/13/2019   BUN 13 09/13/2019   NA 142 09/13/2019   K 4.1 09/13/2019   CL 104 09/13/2019   CO2 24 09/13/2019   Lab Results  Component Value Date   CHOL 155 09/13/2019   HDL 49 09/13/2019   LDLCALC 87 09/13/2019   TRIG 101 09/13/2019   CHOLHDL 3.2 09/13/2019   Lab Results  Component Value Date   TSH 4.560 (H) 09/13/2019   No results found for: HGBA1C Lab Results  Component Value Date   WBC 6.9 09/13/2019   HGB 11.9 09/13/2019   HCT 35.9 09/13/2019   MCV 87 09/13/2019   PLT 283 09/13/2019   Lab Results  Component Value Date   ALT 17 09/13/2019   AST 17 09/13/2019   GGT 11 02/25/2019   ALKPHOS 129 (H) 09/13/2019   BILITOT 0.2 09/13/2019     Review of Systems  Constitutional: Positive for fatigue. Negative for chills, diaphoresis, fever and unexpected weight change.  HENT: Negative for hearing loss, postnasal drip, sinus pressure and sore throat.        No loss of taste or smell  Eyes: Negative for visual disturbance.  Respiratory: Positive for cough, shortness of breath and wheezing.   Cardiovascular: Positive for chest pain. Negative for palpitations.  Gastrointestinal: Negative for abdominal pain,  diarrhea and nausea.  Neurological: Negative for dizziness, tremors, numbness and headaches.    Patient Active Problem List   Diagnosis Date Noted  . History of basal cell cancer 09/13/2019  . Generalized anxiety disorder 09/09/2018  . OAB (overactive bladder) 08/21/2016  . Esophagitis, reflux 10/05/2014  . Hemorrhoids, internal 10/05/2014  . Hyperlipidemia, mixed 10/05/2014  . Benign hypertension 10/05/2014  . Irritable bowel syndrome with constipation 10/05/2014  . Mild intermittent asthma without complication 10/05/2014  . Allergic rhinitis, seasonal 10/05/2014  . DD (diverticular disease) 12/13/2013    Allergies  Allergen Reactions  . Amlodipine Swelling    Past Surgical History:  Procedure Laterality Date  . ABDOMINAL HYSTERECTOMY  1997  . APPENDECTOMY  1997  . COLONOSCOPY  2015   diverticulosis  . PARTIAL HYSTERECTOMY  1979   bleeding from fibroids  . TONSILLECTOMY  1966  . UPPER GASTROINTESTINAL ENDOSCOPY  09/2014   gastritis/esoph; no Barrett's    Social History   Tobacco Use  . Smoking status: Never Smoker  . Smokeless tobacco: Never Used  . Tobacco comment: smoking cessation materials not required  Vaping Use  . Vaping Use: Never used  Substance Use Topics  . Alcohol use: Yes    Alcohol/week: 2.0 standard drinks    Types: 2 Standard drinks or equivalent per week    Comment: Occasionally  . Drug use: No  Medication list has been reviewed and updated.  Current Meds  Medication Sig  . albuterol (PROVENTIL) (2.5 MG/3ML) 0.083% nebulizer solution Inhale 3 mLs into the lungs 4 (four) times daily as needed. Reported on 08/16/2015  . albuterol (VENTOLIN HFA) 108 (90 Base) MCG/ACT inhaler Inhale 2 puffs into the lungs every 6 (six) hours as needed for wheezing or shortness of breath.  Marland Kitchen atorvastatin (LIPITOR) 40 MG tablet TAKE 1 TABLET BY MOUTH EVERYDAY AT BEDTIME  . hyoscyamine (LEVSIN, ANASPAZ) 0.125 MG tablet Take 1 tablet (0.125 mg total) by mouth  every 4 (four) hours as needed.  Marland Kitchen lisinopril (ZESTRIL) 20 MG tablet TAKE 1 TABLET BY MOUTH EVERY DAY  . metoprolol succinate (TOPROL-XL) 25 MG 24 hr tablet Take by mouth.  . nitroGLYCERIN (NITROSTAT) 0.4 MG SL tablet Place 1 tablet (0.4 mg total) under the tongue every 5 (five) minutes as needed for chest pain.  Marland Kitchen omeprazole (PRILOSEC) 20 MG capsule Take 1 capsule (20 mg total) by mouth daily.  . sertraline (ZOLOFT) 50 MG tablet Take 1 tablet (50 mg total) by mouth daily.  Marland Kitchen VITAMIN D PO Take by mouth daily.    PHQ 2/9 Scores 06/08/2020 02/16/2020 11/24/2019 09/13/2019  PHQ - 2 Score 0 0 0 0  PHQ- 9 Score 0 0 0 0    GAD 7 : Generalized Anxiety Score 06/08/2020 02/16/2020 11/24/2019 09/13/2019  Nervous, Anxious, on Edge 0 0 0 0  Control/stop worrying 0 0 0 0  Worry too much - different things 0 0 0 0  Trouble relaxing 0 0 0 0  Restless 0 0 0 0  Easily annoyed or irritable 0 0 0 0  Afraid - awful might happen 0 0 0 0  Total GAD 7 Score 0 0 0 0  Anxiety Difficulty - Not difficult at all Not difficult at all Not difficult at all    BP Readings from Last 3 Encounters:  06/08/20 (!) 170/78  02/16/20 138/80  11/24/19 126/74    Physical Exam Vitals and nursing note reviewed.  Constitutional:      General: She is not in acute distress.    Appearance: Normal appearance. She is well-developed.  HENT:     Head: Normocephalic and atraumatic.  Cardiovascular:     Rate and Rhythm: Normal rate and regular rhythm.     Pulses: Normal pulses.     Heart sounds: No murmur heard.   Pulmonary:     Effort: Pulmonary effort is normal. No accessory muscle usage, prolonged expiration, respiratory distress or retractions.     Breath sounds: Examination of the right-upper field reveals wheezing. Examination of the left-upper field reveals wheezing. Examination of the right-lower field reveals rhonchi. Wheezing and rhonchi present.  Musculoskeletal:        General: Normal range of motion.     Cervical  back: Normal range of motion.  Lymphadenopathy:     Cervical: No cervical adenopathy.  Skin:    General: Skin is warm and dry.     Findings: No rash.  Neurological:     Mental Status: She is alert and oriented to person, place, and time.  Psychiatric:        Mood and Affect: Mood and affect normal.        Behavior: Behavior normal.        Thought Content: Thought content normal.     Wt Readings from Last 3 Encounters:  06/08/20 158 lb (71.7 kg)  02/16/20 158 lb (71.7 kg)  11/24/19 159  lb (72.1 kg)    BP (!) 170/78   Pulse 86   Temp 98 F (36.7 C) (Oral)   Ht 5\' 4"  (1.626 m)   Wt 158 lb (71.7 kg)   SpO2 96%   BMI 27.12 kg/m   Assessment and Plan: 1. Asthma in adult, mild intermittent, with acute exacerbation Continue Mucinex, rest, fluids Use Nebulizer albuterol tid  - predniSONE (DELTASONE) 10 MG tablet; Take 1 tablet (10 mg total) by mouth as directed for 6 days. Take 6,5,4,3,2,1 then stop  Dispense: 21 tablet; Refill: 0 - cefdinir (OMNICEF) 300 MG capsule; Take 1 capsule (300 mg total) by mouth 2 (two) times daily for 10 days.  Dispense: 20 capsule; Refill: 0  2. Cough - guaiFENesin-codeine (ROBITUSSIN AC) 100-10 MG/5ML syrup; Take 5 mLs by mouth 3 (three) times daily as needed for cough.  Dispense: 180 mL; Refill: 0   Partially dictated using . Any errors are unintentional.  Animal nutritionist, MD [facility]  06/08/2020

## 2020-06-26 ENCOUNTER — Ambulatory Visit (INDEPENDENT_AMBULATORY_CARE_PROVIDER_SITE_OTHER): Payer: Medicare PPO | Admitting: Internal Medicine

## 2020-06-26 ENCOUNTER — Encounter: Payer: Self-pay | Admitting: Internal Medicine

## 2020-06-26 ENCOUNTER — Other Ambulatory Visit: Payer: Self-pay

## 2020-06-26 VITALS — BP 140/72 | HR 98 | Temp 98.0°F | Ht 64.0 in | Wt 159.0 lb

## 2020-06-26 DIAGNOSIS — J4541 Moderate persistent asthma with (acute) exacerbation: Secondary | ICD-10-CM | POA: Diagnosis not present

## 2020-06-26 DIAGNOSIS — R059 Cough, unspecified: Secondary | ICD-10-CM

## 2020-06-26 MED ORDER — FLUTICASONE-SALMETEROL 115-21 MCG/ACT IN AERO: 2.0000 | INHALATION_SPRAY | Freq: Two times a day (BID) | RESPIRATORY_TRACT | 12 refills | Status: DC

## 2020-06-26 MED ORDER — GUAIFENESIN-CODEINE 100-10 MG/5ML PO SYRP: 5.0000 mL | ORAL_SOLUTION | Freq: Three times a day (TID) | ORAL | 0 refills | Status: DC | PRN

## 2020-06-26 NOTE — Patient Instructions (Signed)
Flonase nasal spray - fluticasone nasal spray use as directed

## 2020-06-26 NOTE — Progress Notes (Signed)
Date:  06/26/2020   Name:  Sarah Hernandez   DOB:  04-Nov-1950   MRN:  [id number]   Chief Complaint: Cough (Wheezing, no mucous, out of cough syrup, nasal congestion )  Asthma She complains of chest tightness, cough and wheezing. This is a chronic problem. The problem has been gradually improving. The cough is non-productive. Pertinent negatives include no chest pain, fever, headaches or sore throat. Her past medical history is significant for asthma.    Lab Results  Component Value Date   CREATININE 0.95 09/13/2019   BUN 13 09/13/2019   NA 142 09/13/2019   K 4.1 09/13/2019   CL 104 09/13/2019   CO2 24 09/13/2019   Lab Results  Component Value Date   CHOL 155 09/13/2019   HDL 49 09/13/2019   LDLCALC 87 09/13/2019   TRIG 101 09/13/2019   CHOLHDL 3.2 09/13/2019   Lab Results  Component Value Date   TSH 4.560 (H) 09/13/2019   No results found for: HGBA1C Lab Results  Component Value Date   WBC 6.9 09/13/2019   HGB 11.9 09/13/2019   HCT 35.9 09/13/2019   MCV 87 09/13/2019   PLT 283 09/13/2019   Lab Results  Component Value Date   ALT 17 09/13/2019   AST 17 09/13/2019   GGT 11 02/25/2019   ALKPHOS 129 (H) 09/13/2019   BILITOT 0.2 09/13/2019     Review of Systems  Constitutional: Negative for chills, fatigue and fever.  HENT: Positive for congestion. Negative for sinus pressure, sinus pain and sore throat.   Respiratory: Positive for cough, chest tightness and wheezing.   Cardiovascular: Negative for chest pain and palpitations.  Neurological: Negative for dizziness and headaches.    Patient Active Problem List   Diagnosis Date Noted  . History of basal cell cancer 09/13/2019  . Generalized anxiety disorder 09/09/2018  . OAB (overactive bladder) 08/21/2016  . Esophagitis, reflux 10/05/2014  . Hemorrhoids, internal 10/05/2014  . Hyperlipidemia, mixed 10/05/2014  . Benign hypertension 10/05/2014  . Irritable bowel syndrome with constipation 10/05/2014  .  Mild intermittent asthma without complication 10/05/2014  . Allergic rhinitis, seasonal 10/05/2014  . DD (diverticular disease) 12/13/2013    Allergies  Allergen Reactions  . Amlodipine Swelling    Past Surgical History:  Procedure Laterality Date  . ABDOMINAL HYSTERECTOMY  1997  . APPENDECTOMY  1997  . COLONOSCOPY  2015   diverticulosis  . PARTIAL HYSTERECTOMY  1979   bleeding from fibroids  . TONSILLECTOMY  1966  . UPPER GASTROINTESTINAL ENDOSCOPY  09/2014   gastritis/esoph; no Barrett's    Social History   Tobacco Use  . Smoking status: Never Smoker  . Smokeless tobacco: Never Used  . Tobacco comment: smoking cessation materials not required  Vaping Use  . Vaping Use: Never used  Substance Use Topics  . Alcohol use: Yes    Alcohol/week: 2.0 standard drinks    Types: 2 Standard drinks or equivalent per week    Comment: Occasionally  . Drug use: No     Medication list has been reviewed and updated.  Current Meds  Medication Sig  . albuterol (PROVENTIL) (2.5 MG/3ML) 0.083% nebulizer solution Inhale 3 mLs into the lungs 4 (four) times daily as needed. Reported on 08/16/2015  . albuterol (VENTOLIN HFA) 108 (90 Base) MCG/ACT inhaler Inhale 2 puffs into the lungs every 6 (six) hours as needed for wheezing or shortness of breath.  Marland Kitchen atorvastatin (LIPITOR) 40 MG tablet TAKE 1 TABLET BY MOUTH  EVERYDAY AT BEDTIME  . hyoscyamine (LEVSIN, ANASPAZ) 0.125 MG tablet Take 1 tablet (0.125 mg total) by mouth every 4 (four) hours as needed.  Marland Kitchen lisinopril (ZESTRIL) 20 MG tablet TAKE 1 TABLET BY MOUTH EVERY DAY  . metoprolol succinate (TOPROL-XL) 25 MG 24 hr tablet Take by mouth.  . nitroGLYCERIN (NITROSTAT) 0.4 MG SL tablet Place 1 tablet (0.4 mg total) under the tongue every 5 (five) minutes as needed for chest pain.  Marland Kitchen omeprazole (PRILOSEC) 20 MG capsule Take 1 capsule (20 mg total) by mouth daily.  . sertraline (ZOLOFT) 50 MG tablet Take 1 tablet (50 mg total) by mouth daily.  Marland Kitchen  VITAMIN D PO Take by mouth daily.    PHQ 2/9 Scores 06/26/2020 06/08/2020 02/16/2020 11/24/2019  PHQ - 2 Score 0 0 0 0  PHQ- 9 Score 0 0 0 0    GAD 7 : Generalized Anxiety Score 06/26/2020 06/08/2020 02/16/2020 11/24/2019  Nervous, Anxious, on Edge 0 0 0 0  Control/stop worrying 0 0 0 0  Worry too much - different things 0 0 0 0  Trouble relaxing 0 0 0 0  Restless 0 0 0 0  Easily annoyed or irritable 0 0 0 0  Afraid - awful might happen 0 0 0 0  Total GAD 7 Score 0 0 0 0  Anxiety Difficulty - - Not difficult at all Not difficult at all    BP Readings from Last 3 Encounters:  06/26/20 140/72  06/08/20 (!) 146/76  02/16/20 138/80    Physical Exam Vitals and nursing note reviewed.  Constitutional:      General: She is not in acute distress.    Appearance: Normal appearance. She is well-developed.  HENT:     Head: Normocephalic and atraumatic.  Cardiovascular:     Rate and Rhythm: Normal rate and regular rhythm.  Pulmonary:     Effort: Pulmonary effort is normal. No respiratory distress.     Breath sounds: No wheezing or rhonchi.     Comments: Exam much improved from last visit Musculoskeletal:     Cervical back: Normal range of motion.  Lymphadenopathy:     Cervical: No cervical adenopathy.  Skin:    General: Skin is warm and dry.     Findings: No rash.  Neurological:     General: No focal deficit present.     Mental Status: She is alert and oriented to person, place, and time.  Psychiatric:        Mood and Affect: Mood and affect and mood normal.     Wt Readings from Last 3 Encounters:  06/26/20 159 lb (72.1 kg)  06/08/20 158 lb (71.7 kg)  02/16/20 158 lb (71.7 kg)    BP 140/72   Pulse 98   Temp 98 F (36.7 C) (Oral)   Ht 5\' 4"  (1.626 m)   Wt 159 lb (72.1 kg)   SpO2 94%   BMI 27.29 kg/m   Assessment and Plan: 1. Moderate persistent asthma with exacerbation Suspect that the cough and wheezing are residual from recent URI Will add Advair bid - pt can taper  to one puff bid after one month if doing well and not using albuterol Follow up if no improvement - fluticasone-salmeterol (ADVAIR HFA) 115-21 MCG/ACT inhaler; Inhale 2 puffs into the lungs 2 (two) times daily.  Dispense: 1 each; Refill: 12  2. Cough Recommend Flonase for nasal congestion Continue robitussin PRN - guaiFENesin-codeine (ROBITUSSIN AC) 100-10 MG/5ML syrup; Take 5 mLs by mouth  3 (three) times daily as needed for cough.  Dispense: 180 mL; Refill: 0   Partially dictated using Animal nutritionist. Any errors are unintentional.  Bari Edward, MD [facility]  06/26/2020

## 2020-07-12 ENCOUNTER — Encounter: Payer: Self-pay | Admitting: Internal Medicine

## 2020-07-13 ENCOUNTER — Ambulatory Visit: Payer: Medicare PPO | Admitting: Internal Medicine

## 2020-07-13 ENCOUNTER — Telehealth: Payer: Self-pay

## 2020-07-13 ENCOUNTER — Other Ambulatory Visit: Payer: Self-pay

## 2020-07-13 ENCOUNTER — Ambulatory Visit (INDEPENDENT_AMBULATORY_CARE_PROVIDER_SITE_OTHER): Payer: Medicare PPO | Admitting: Internal Medicine

## 2020-07-13 ENCOUNTER — Encounter: Payer: Self-pay | Admitting: Internal Medicine

## 2020-07-13 VITALS — BP 180/102 | Ht 64.0 in

## 2020-07-13 DIAGNOSIS — U071 COVID-19: Secondary | ICD-10-CM

## 2020-07-13 DIAGNOSIS — R059 Cough, unspecified: Secondary | ICD-10-CM | POA: Diagnosis not present

## 2020-07-13 MED ORDER — AZITHROMYCIN 250 MG PO TABS: ORAL_TABLET | ORAL | 0 refills | Status: AC

## 2020-07-13 MED ORDER — GUAIFENESIN-CODEINE 100-10 MG/5ML PO SYRP: 5.0000 mL | ORAL_SOLUTION | Freq: Three times a day (TID) | ORAL | 0 refills | Status: DC | PRN

## 2020-07-13 NOTE — Telephone Encounter (Signed)
This visit type is being conducted due to national recommendations for restrictions regarding the COVID- 19 Pandemic (e.g. social distancing) in effort to limit this patients exposure and mitigate transmission in our community. This visit type is felt to be most appropriate for this patient at this time. I connected with the patient today and received telephone consent from the patient and patient understand this consent will be good for 1 year.  KP  

## 2020-07-13 NOTE — Progress Notes (Signed)
Date:  07/13/2020   Name:  Sarah Hernandez   DOB:  [birth year]   MRN:  [id number]  I connected with this patient, Sarah Hernandez, by telephone at the patient's home.  I verified that I am speaking with the correct person using two identifiers. This visit was conducted via telephone due to the Covid-19 outbreak from my office at [facility] in [place], [place]. I discussed the limitations, risks, security and privacy concerns of performing an evaluation and management service by telephone. I also discussed with the patient that there may be a patient responsible charge related to this service. The patient expressed understanding and agreed to proceed.  Chief Complaint: Cough (Tested positive 07/11/2020 home test , Congested, yellow color mucous when coughing/blowing nose, no fever, no headache )  Cough This is a new problem. The current episode started in the past 7 days. The problem has been rapidly worsening. The problem occurs every few minutes. The cough is non-productive. Associated symptoms include chills, a sore throat and wheezing. Pertinent negatives include no chest pain, fever, headaches or shortness of breath.    Lab Results  Component Value Date   CREATININE 0.95 09/13/2019   BUN 13 09/13/2019   NA 142 09/13/2019   K 4.1 09/13/2019   CL 104 09/13/2019   CO2 24 09/13/2019   Lab Results  Component Value Date   CHOL 155 09/13/2019   HDL 49 09/13/2019   LDLCALC 87 09/13/2019   TRIG 101 09/13/2019   CHOLHDL 3.2 09/13/2019   Lab Results  Component Value Date   TSH 4.560 (H) 09/13/2019   No results found for: HGBA1C Lab Results  Component Value Date   WBC 6.9 09/13/2019   HGB 11.9 09/13/2019   HCT 35.9 09/13/2019   MCV 87 09/13/2019   PLT 283 09/13/2019   Lab Results  Component Value Date   ALT 17 09/13/2019   AST 17 09/13/2019   GGT 11 02/25/2019   ALKPHOS 129 (H) 09/13/2019   BILITOT 0.2 09/13/2019     Review of Systems  Constitutional: Positive for  chills. Negative for fever.  HENT: Positive for congestion, sinus pressure and sore throat.   Respiratory: Positive for cough and wheezing. Negative for chest tightness and shortness of breath.   Cardiovascular: Negative for chest pain and palpitations.  Gastrointestinal: Negative for diarrhea and nausea.  Neurological: Negative for dizziness and headaches.    Patient Active Problem List   Diagnosis Date Noted  . History of basal cell cancer 09/13/2019  . Generalized anxiety disorder 09/09/2018  . OAB (overactive bladder) 08/21/2016  . Esophagitis, reflux 10/05/2014  . Hemorrhoids, internal 10/05/2014  . Hyperlipidemia, mixed 10/05/2014  . Benign hypertension 10/05/2014  . Irritable bowel syndrome with constipation 10/05/2014  . Mild intermittent asthma without complication 10/05/2014  . Allergic rhinitis, seasonal 10/05/2014  . DD (diverticular disease) 12/13/2013    Allergies  Allergen Reactions  . Amlodipine Swelling    Past Surgical History:  Procedure Laterality Date  . ABDOMINAL HYSTERECTOMY  1997  . APPENDECTOMY  1997  . COLONOSCOPY  2015   diverticulosis  . PARTIAL HYSTERECTOMY  1979   bleeding from fibroids  . TONSILLECTOMY  1966  . UPPER GASTROINTESTINAL ENDOSCOPY  09/2014   gastritis/esoph; no Barrett's    Social History   Tobacco Use  . Smoking status: Never Smoker  . Smokeless tobacco: Never Used  . Tobacco comment: smoking cessation materials not required  Vaping Use  . Vaping Use: Never used  Substance Use Topics  . Alcohol use: Yes    Alcohol/week: 2.0 standard drinks    Types: 2 Standard drinks or equivalent per week    Comment: Occasionally  . Drug use: No     Medication list has been reviewed and updated.  Current Meds  Medication Sig  . albuterol (PROVENTIL) (2.5 MG/3ML) 0.083% nebulizer solution Inhale 3 mLs into the lungs 4 (four) times daily as needed. Reported on 08/16/2015  . albuterol (VENTOLIN HFA) 108 (90 Base) MCG/ACT inhaler  Inhale 2 puffs into the lungs every 6 (six) hours as needed for wheezing or shortness of breath.  Marland Kitchen atorvastatin (LIPITOR) 40 MG tablet TAKE 1 TABLET BY MOUTH EVERYDAY AT BEDTIME  . Cyanocobalamin (VITAMIN B-12 PO) Take by mouth.  . fluticasone-salmeterol (ADVAIR HFA) 115-21 MCG/ACT inhaler Inhale 2 puffs into the lungs 2 (two) times daily.  Marland Kitchen guaiFENesin-codeine (ROBITUSSIN AC) 100-10 MG/5ML syrup Take 5 mLs by mouth 3 (three) times daily as needed for cough.  . hyoscyamine (LEVSIN, ANASPAZ) 0.125 MG tablet Take 1 tablet (0.125 mg total) by mouth every 4 (four) hours as needed.  Marland Kitchen lisinopril (ZESTRIL) 20 MG tablet TAKE 1 TABLET BY MOUTH EVERY DAY  . metoprolol succinate (TOPROL-XL) 25 MG 24 hr tablet Take by mouth.  . nitroGLYCERIN (NITROSTAT) 0.4 MG SL tablet Place 1 tablet (0.4 mg total) under the tongue every 5 (five) minutes as needed for chest pain.  Marland Kitchen omeprazole (PRILOSEC) 20 MG capsule Take 1 capsule (20 mg total) by mouth daily.  . sertraline (ZOLOFT) 50 MG tablet Take 1 tablet (50 mg total) by mouth daily.  Marland Kitchen VITAMIN D PO Take by mouth daily.    PHQ 2/9 Scores 06/26/2020 06/08/2020 02/16/2020 11/24/2019  PHQ - 2 Score 0 0 0 0  PHQ- 9 Score 0 0 0 0    GAD 7 : Generalized Anxiety Score 06/26/2020 06/08/2020 02/16/2020 11/24/2019  Nervous, Anxious, on Edge 0 0 0 0  Control/stop worrying 0 0 0 0  Worry too much - different things 0 0 0 0  Trouble relaxing 0 0 0 0  Restless 0 0 0 0  Easily annoyed or irritable 0 0 0 0  Afraid - awful might happen 0 0 0 0  Total GAD 7 Score 0 0 0 0  Anxiety Difficulty - - Not difficult at all Not difficult at all    BP Readings from Last 3 Encounters:  07/13/20 (!) 180/102  06/26/20 140/72  06/08/20 (!) 146/76    Physical Exam Constitutional:      Appearance: Normal appearance.  Pulmonary:     Effort: Pulmonary effort is normal.     Comments: Loose cough noted - no wheezing appreciated Neurological:     Mental Status: She is alert.   Psychiatric:        Attention and Perception: Attention normal.        Mood and Affect: Mood normal.        Speech: Speech normal.        Cognition and Memory: Cognition normal.     Wt Readings from Last 3 Encounters:  06/26/20 159 lb (72.1 kg)  06/08/20 158 lb (71.7 kg)  02/16/20 158 lb (71.7 kg)    BP (!) 180/102   Ht 5\' 4"  (1.626 m)   BMI 27.29 kg/m   Assessment and Plan: 1. COVID-19 Quarantine at least 5 days from positive test. Rest and fluids; tylenol if needed  - azithromycin (ZITHROMAX Z-PAK) 250 MG tablet; UAD  Dispense:  6 each; Refill: 0  2. Cough - guaiFENesin-codeine (ROBITUSSIN AC) 100-10 MG/5ML syrup; Take 5 mLs by mouth 3 (three) times daily as needed for cough.  Dispense: 180 mL; Refill: 0  I spent 10 minutes on this encounter. Partially dictated using Animal nutritionist. Any errors are unintentional.  Bari Edward, MD [facility]  07/13/2020

## 2020-07-15 ENCOUNTER — Other Ambulatory Visit: Payer: Self-pay | Admitting: Internal Medicine

## 2020-07-15 DIAGNOSIS — J4520 Mild intermittent asthma, uncomplicated: Secondary | ICD-10-CM

## 2020-07-25 ENCOUNTER — Encounter: Payer: Self-pay | Admitting: Internal Medicine

## 2020-08-15 ENCOUNTER — Other Ambulatory Visit: Payer: Self-pay | Admitting: Internal Medicine

## 2020-08-15 DIAGNOSIS — F411 Generalized anxiety disorder: Secondary | ICD-10-CM

## 2020-09-11 ENCOUNTER — Ambulatory Visit (INDEPENDENT_AMBULATORY_CARE_PROVIDER_SITE_OTHER): Payer: Medicare PPO

## 2020-09-11 ENCOUNTER — Other Ambulatory Visit: Payer: Self-pay

## 2020-09-11 VITALS — BP 152/76 | HR 67 | Temp 98.0°F | Resp 16 | Ht 64.0 in | Wt 159.0 lb

## 2020-09-11 DIAGNOSIS — Z0000 Encounter for general adult medical examination without abnormal findings: Secondary | ICD-10-CM

## 2020-09-11 NOTE — Patient Instructions (Signed)
Sarah Hernandez , Thank you for taking time to come for your Medicare Wellness Visit. I appreciate your ongoing commitment to your health goals. Please review the following plan we discussed and let me know if I can assist you in the future.   Screening recommendations/referrals: Colonoscopy: done 10/19/13. Repeat in 2025 Mammogram: done 11/01/19 Bone Density: done 11/01/19 Recommended yearly ophthalmology/optometry visit for glaucoma screening and checkup Recommended yearly dental visit for hygiene and checkup  Vaccinations: Influenza vaccine: done 02/16/20 Pneumococcal vaccine: done 08/10/14 Tdap vaccine: done 10/28/18 Shingles vaccine: done 03/07/19 & 05/07/19   Covid-19:done 07/17/19, 08/08/19 & 03/08/20  Advanced directives: Please bring a copy of your health care power of attorney and living will to the office at your convenience.  Conditions/risks identified: Keep up the great work!  Next appointment: Follow up in one year for your annual wellness visit    Preventive Care 65 Years and Older, Female Preventive care refers to lifestyle choices and visits with your health care provider that can promote health and wellness. What does preventive care include?  A yearly physical exam. This is also called an annual well check.  Dental exams once or twice a year.  Routine eye exams. Ask your health care provider how often you should have your eyes checked.  Personal lifestyle choices, including:  Daily care of your teeth and gums.  Regular physical activity.  Eating a healthy diet.  Avoiding tobacco and drug use.  Limiting alcohol use.  Practicing safe sex.  Taking low-dose aspirin every day.  Taking vitamin and mineral supplements as recommended by your health care provider. What happens during an annual well check? The services and screenings done by your health care provider during your annual well check will depend on your age, overall health, lifestyle risk factors, and  family history of disease. Counseling  Your health care provider may ask you questions about your:  Alcohol use.  Tobacco use.  Drug use.  Emotional well-being.  Home and relationship well-being.  Sexual activity.  Eating habits.  History of falls.  Memory and ability to understand (cognition).  Work and work Astronomer.  Reproductive health. Screening  You may have the following tests or measurements:  Height, weight, and BMI.  Blood pressure.  Lipid and cholesterol levels. These may be checked every 5 years, or more frequently if you are over 85 years old.  Skin check.  Lung cancer screening. You may have this screening every year starting at age 60 if you have a 30-pack-year history of smoking and currently smoke or have quit within the past 15 years.  Fecal occult blood test (FOBT) of the stool. You may have this test every year starting at age 40.  Flexible sigmoidoscopy or colonoscopy. You may have a sigmoidoscopy every 5 years or a colonoscopy every 10 years starting at age 61.  Hepatitis C blood test.  Hepatitis B blood test.  Sexually transmitted disease (STD) testing.  Diabetes screening. This is done by checking your blood sugar (glucose) after you have not eaten for a while (fasting). You may have this done every 1-3 years.  Bone density scan. This is done to screen for osteoporosis. You may have this done starting at age 37.  Mammogram. This may be done every 1-2 years. Talk to your health care provider about how often you should have regular mammograms. Talk with your health care provider about your test results, treatment options, and if necessary, the need for more tests. Vaccines  Your health care  provider may recommend certain vaccines, such as:  Influenza vaccine. This is recommended every year.  Tetanus, diphtheria, and acellular pertussis (Tdap, Td) vaccine. You may need a Td booster every 10 years.  Zoster vaccine. You may need this  after age 82.  Pneumococcal 13-valent conjugate (PCV13) vaccine. One dose is recommended after age 3.  Pneumococcal polysaccharide (PPSV23) vaccine. One dose is recommended after age 51. Talk to your health care provider about which screenings and vaccines you need and how often you need them. This information is not intended to replace advice given to you by your health care provider. Make sure you discuss any questions you have with your health care provider. Document Released: 06/09/2015 Document Revised: 01/31/2016 Document Reviewed: 03/14/2015 Elsevier Interactive Patient Education  2017 [employer] Prevention in the Home Falls can cause injuries. They can happen to people of all ages. There are many things you can do to make your home safe and to help prevent falls. What can I do on the outside of my home?  Regularly fix the edges of walkways and driveways and fix any cracks.  Remove anything that might make you trip as you walk through a door, such as a raised step or threshold.  Trim any bushes or trees on the path to your home.  Use bright outdoor lighting.  Clear any walking paths of anything that might make someone trip, such as rocks or tools.  Regularly check to see if handrails are loose or broken. Make sure that both sides of any steps have handrails.  Any raised decks and porches should have guardrails on the edges.  Have any leaves, snow, or ice cleared regularly.  Use sand or salt on walking paths during winter.  Clean up any spills in your garage right away. This includes oil or grease spills. What can I do in the bathroom?  Use night lights.  Install grab bars by the toilet and in the tub and shower. Do not use towel bars as grab bars.  Use non-skid mats or decals in the tub or shower.  If you need to sit down in the shower, use a plastic, non-slip stool.  Keep the floor dry. Clean up any water that spills on the floor as soon as it  happens.  Remove soap buildup in the tub or shower regularly.  Attach bath mats securely with double-sided non-slip rug tape.  Do not have throw rugs and other things on the floor that can make you trip. What can I do in the bedroom?  Use night lights.  Make sure that you have a light by your bed that is easy to reach.  Do not use any sheets or blankets that are too big for your bed. They should not hang down onto the floor.  Have a firm chair that has side arms. You can use this for support while you get dressed.  Do not have throw rugs and other things on the floor that can make you trip. What can I do in the kitchen?  Clean up any spills right away.  Avoid walking on wet floors.  Keep items that you use a lot in easy-to-reach places.  If you need to reach something above you, use a strong step stool that has a grab bar.  Keep electrical cords out of the way.  Do not use floor polish or wax that makes floors slippery. If you must use wax, use non-skid floor wax.  Do not have throw  rugs and other things on the floor that can make you trip. What can I do with my stairs?  Do not leave any items on the stairs.  Make sure that there are handrails on both sides of the stairs and use them. Fix handrails that are broken or loose. Make sure that handrails are as long as the stairways.  Check any carpeting to make sure that it is firmly attached to the stairs. Fix any carpet that is loose or worn.  Avoid having throw rugs at the top or bottom of the stairs. If you do have throw rugs, attach them to the floor with carpet tape.  Make sure that you have a light switch at the top of the stairs and the bottom of the stairs. If you do not have them, ask someone to add them for you. What else can I do to help prevent falls?  Wear shoes that:  Do not have high heels.  Have rubber bottoms.  Are comfortable and fit you well.  Are closed at the toe. Do not wear sandals.  If you  use a stepladder:  Make sure that it is fully opened. Do not climb a closed stepladder.  Make sure that both sides of the stepladder are locked into place.  Ask someone to hold it for you, if possible.  Clearly mark and make sure that you can see:  Any grab bars or handrails.  First and last steps.  Where the edge of each step is.  Use tools that help you move around (mobility aids) if they are needed. These include:  Canes.  Walkers.  Scooters.  Crutches.  Turn on the lights when you go into a dark area. Replace any light bulbs as soon as they burn out.  Set up your furniture so you have a clear path. Avoid moving your furniture around.  If any of your floors are uneven, fix them.  If there are any pets around you, be aware of where they are.  Review your medicines with your doctor. Some medicines can make you feel dizzy. This can increase your chance of falling. Ask your doctor what other things that you can do to help prevent falls. This information is not intended to replace advice given to you by your health care provider. Make sure you discuss any questions you have with your health care provider. Document Released: 03/09/2009 Document Revised: 10/19/2015 Document Reviewed: 06/17/2014 Elsevier Interactive Patient Education  2017 [employer].

## 2020-09-11 NOTE — Progress Notes (Signed)
Subjective:   Sarah Hernandez is a 70 y.o. female who presents for Medicare Annual (Subsequent) preventive examination.  Review of Systems     Cardiac Risk Factors include: advanced age (>4255men, 54>65 women);dyslipidemia;hypertension     Objective:    Today's Vitals   09/11/20 0830  BP: (!) 152/76  Pulse: 67  Resp: 16  Temp: 98 F (36.7 C)  TempSrc: Oral  SpO2: 97%  Weight: 159 lb (72.1 kg)  Height: 5\' 4"  (1.626 m)   Body mass index is 27.29 kg/m.  Advanced Directives 09/11/2020 09/08/2019 07/09/2019 09/07/2018 05/05/2018 08/27/2017 08/21/2016  Does Patient Have a Medical Advance Directive? Yes Yes Yes Yes Yes Yes Yes  Type of Estate agentAdvance Directive Healthcare Power of [place];Living will Healthcare Power of [place];Living will Healthcare Power of [place];Living will Healthcare Power of [place];Living will Healthcare Power of [place]orney;Living will;Out of facility DNR (pink MOST or yellow form) Healthcare Power of [place];Living will Living will  Copy of Healthcare Power of Attorney in Chart? No - copy requested No - copy requested - No - copy requested No - copy requested No - copy requested -    Current Medications (verified) Outpatient Encounter Medications as of 09/11/2020  Medication Sig  . albuterol (PROVENTIL) (2.5 MG/3ML) 0.083% nebulizer solution Inhale 3 mLs into the lungs 4 (four) times daily as needed. Reported on 08/16/2015  . albuterol (VENTOLIN HFA) 108 (90 Base) MCG/ACT inhaler TAKE 2 PUFFS BY MOUTH EVERY 6 HOURS AS NEEDED FOR WHEEZE OR SHORTNESS OF BREATH  . atorvastatin (LIPITOR) 40 MG tablet TAKE 1 TABLET BY MOUTH EVERYDAY AT BEDTIME  . Cyanocobalamin (VITAMIN B-12 PO) Take by mouth.  . fluticasone-salmeterol (ADVAIR HFA) 115-21 MCG/ACT inhaler Inhale 2 puffs into the lungs 2 (two) times daily.  . hyoscyamine (LEVSIN, ANASPAZ) 0.125 MG tablet Take 1 tablet (0.125 mg total) by mouth every 4 (four) hours as needed.  Marland Kitchen. lisinopril (ZESTRIL) 20 MG tablet TAKE 1 TABLET BY  MOUTH EVERY DAY  . omeprazole (PRILOSEC) 20 MG capsule Take 1 capsule (20 mg total) by mouth daily.  . sertraline (ZOLOFT) 50 MG tablet TAKE 1 TABLET BY MOUTH EVERY DAY  . VITAMIN D PO Take by mouth daily.  . metoprolol succinate (TOPROL-XL) 25 MG 24 hr tablet Take by mouth.  . nitroGLYCERIN (NITROSTAT) 0.4 MG SL tablet Place 1 tablet (0.4 mg total) under the tongue every 5 (five) minutes as needed for chest pain.  . [DISCONTINUED] guaiFENesin-codeine (ROBITUSSIN AC) 100-10 MG/5ML syrup Take 5 mLs by mouth 3 (three) times daily as needed for cough.   No facility-administered encounter medications on file as of 09/11/2020.    Allergies (verified) Amlodipine   History: Past Medical History:  Diagnosis Date  . Allergy Seasonal  . Anxiety Meds  . Asthma Meds as needed  . Diverticulosis   . GERD (gastroesophageal reflux disease)   . History of basal cell cancer 09/13/2019  . Hyperlipidemia   . Hypertension   . Irritable bowel   . Overactive bladder    Past Surgical History:  Procedure Laterality Date  . ABDOMINAL HYSTERECTOMY  1997  . APPENDECTOMY  1997  . COLONOSCOPY  2015   diverticulosis  . PARTIAL HYSTERECTOMY  1979   bleeding from fibroids  . TONSILLECTOMY  1966  . UPPER GASTROINTESTINAL ENDOSCOPY  09/2014   gastritis/esoph; no Barrett's   Family History  Problem Relation Age of Onset  . Dementia Mother   . Varicose Veins Mother   . CAD Father   . Heart  disease Father   . CAD Brother   . Stroke Brother    Social History   Socioeconomic History  . Marital status: Married    Spouse name: Not on file  . Number of children: 2  . Years of education: some college  . Highest education level: 12th grade  Occupational History  . Occupation: part time    Comment: Optician, dispensing  Tobacco Use  . Smoking status: Never Smoker  . Smokeless tobacco: Never Used  . Tobacco comment: smoking cessation materials not required  Vaping Use  . Vaping Use: Never used   Substance and Sexual Activity  . Alcohol use: Yes    Alcohol/week: 0.0 standard drinks    Comment: Occasionally  . Drug use: No  . Sexual activity: Not on file  Other Topics Concern  . Not on file  Social History Narrative  . Not on file   Social Determinants of Health   Financial Resource Strain: Low Risk   . Difficulty of Paying Living Expenses: Not hard at all  Food Insecurity: No Food Insecurity  . Worried About Programme researcher, broadcasting/film/video in the Last Year: Never true  . Ran Out of Food in the Last Year: Never true  Transportation Needs: No Transportation Needs  . Lack of Transportation (Medical): No  . Lack of Transportation (Non-Medical): No  Physical Activity: Sufficiently Active  . Days of Exercise per Week: 6 days  . Minutes of Exercise per Session: 40 min  Stress: Stress Concern Present  . Feeling of Stress : To some extent  Social Connections: Moderately Integrated  . Frequency of Communication with Friends and Family: More than three times a week  . Frequency of Social Gatherings with Friends and Family: Three times a week  . Attends Religious Services: More than 4 times per year  . Active Member of Clubs or Organizations: No  . Attends Banker Meetings: Never  . Marital Status: Married    Tobacco Counseling Counseling given: Not Answered Comment: smoking cessation materials not required   Clinical Intake:  Pre-visit preparation completed: Yes  Pain : No/denies pain     BMI - recorded: 27.29 Nutritional Status: BMI 25 -29 Overweight Nutritional Risks: None Diabetes: No  How often do you need to have someone help you when you read instructions, pamphlets, or other written materials from your doctor or pharmacy?: 1 - Never    Interpreter Needed?: No  Information entered by :: Reather Littler LPN   Activities of Daily Living In your present state of health, do you have any difficulty performing the following activities: 09/11/2020  Hearing? Y   Comment declines hearing aids  Vision? N  Difficulty concentrating or making decisions? N  Walking or climbing stairs? N  Dressing or bathing? N  Doing errands, shopping? N  Preparing Food and eating ? N  Using the Toilet? N  In the past six months, have you accidently leaked urine? N  Do you have problems with loss of bowel control? N  Managing your Medications? N  Managing your Finances? N  Housekeeping or managing your Housekeeping? N  Some recent data might be hidden    Patient Care Team: Reubin Milan, MD as PCP - General (Internal Medicine) Alwyn Pea, MD as Consulting Physician (Cardiology)  Indicate any recent Medical Services you may have received from other than [facility] providers in the past year (date may be approximate).     Assessment:   This is a routine  wellness examination for [facility].  Hearing/Vision screen  Hearing Screening   125Hz  250Hz  500Hz  1000Hz  2000Hz  3000Hz  4000Hz  6000Hz  8000Hz   Right ear:           Left ear:           Comments: Pt c/o mild hearing difficulty; past hearing evaluation states no need for hearing aids approx 3 years ago  Vision Screening Comments: [facility] screen[facility]  Dietary issues and exercise activities discussed: Current Exercise Habits: Home exercise routine, Type of exercise: walking, Time (Minutes): 40, Frequency (Times/Week): 6, Weekly Exercise (Minutes/Week): 240, Intensity: Mild, Exercise limited by: None identified  Goals    . DIET - INCREASE WATER INTAKE     Recommend to drink at least 6-8 8oz glasses of water per day.      Depression Screen PHQ 2/9 Scores 09/11/2020 06/26/2020 06/08/2020 02/16/2020 11/24/2019 09/13/2019 09/08/2019  PHQ - 2 Score 0 0 0 0 0 0 0  PHQ- 9 Score - 0 0 0 0 0 -    Fall Risk Fall Risk  09/11/2020 06/26/2020 06/08/2020 11/24/2019 09/13/2019  Falls in the past year? 1 0 0 0 0  Number falls in past yr: 0 - - 0 0  Injury with Fall? 0 - - 0 0  Risk for fall due to :  Impaired balance/gait - - No Fall Risks No Fall Risks  Risk for fall due to: Comment - - - - -  Follow up Falls prevention discussed Falls evaluation completed Falls evaluation completed Falls evaluation completed Falls evaluation completed    FALL RISK PREVENTION PERTAINING TO THE HOME:  Any stairs in or around the home? Yes  If so, are there any without handrails? No  Home free of loose throw rugs in walkways, pet beds, electrical cords, etc? Yes  Adequate lighting in your home to reduce risk of falls? Yes   ASSISTIVE DEVICES UTILIZED TO PREVENT FALLS:  Life alert? No  Use of a cane, walker or w/c? No  Grab bars in the bathroom? No  Shower chair or bench in shower? No  Elevated toilet seat or a handicapped toilet? No   TIMED UP AND GO:  Was the test performed? Yes .  Length of time to ambulate 10 feet: 4 sec.   Gait steady and fast without use of assistive device  Cognitive Function: Normal cognitive status assessed by direct observation by this Nurse Health Advisor. No abnormalities found.       6CIT Screen 09/08/2019 09/07/2018 08/27/2017 08/21/2016  What Year? 0 points 0 points 0 points 0 points  What month? 0 points 0 points 0 points 0 points  What time? 0 points 0 points 0 points 0 points  Count back from 20 0 points 0 points 0 points 0 points  Months in reverse 0 points 0 points 0 points 0 points  Repeat phrase 0 points 0 points 0 points 0 points  Total Score 0 0 0 0    Immunizations Immunization History  Administered Date(s) Administered  . Fluad Quad(high Dose 65+) 01/20/2019, 02/16/2020  . Influenza, High Dose Seasonal PF 02/02/2018  . Influenza,inj,Quad PF,6+ Mos 03/08/2015, 02/14/2016, 01/28/2017  . PFIZER(Purple Top)SARS-COV-2 Vaccination 07/17/2019, 08/08/2019, 03/08/2020  . Pneumococcal Conjugate-13 08/10/2014  . Pneumococcal Polysaccharide-23 06/15/2013  . Tdap 05/28/2008, 10/28/2018  . Zoster 05/28/2012  . Zoster Recombinat (Shingrix) 03/07/2019,  05/07/2019    TDAP status: Up to date  Flu Vaccine status: Up to date  Pneumococcal vaccine status: Up to date  Covid-19 vaccine status: Completed vaccines  Qualifies for Shingles Vaccine? Yes   Zostavax completed Yes   Shingrix Completed?: Yes  Screening Tests Health Maintenance  Topic Date Due  . COVID-19 Vaccine (4 - Booster for Pfizer series) 09/06/2020  . MAMMOGRAM  10/31/2020  . INFLUENZA VACCINE  12/25/2020  . COLONOSCOPY (Pts 45-52yrs Insurance coverage will need to be confirmed)  10/20/2023  . TETANUS/TDAP  10/27/2028  . DEXA SCAN  Completed  . Hepatitis C Screening  Completed  . PNA vac Low Risk Adult  Completed  . HPV VACCINES  Aged Out    Health Maintenance  Health Maintenance Due  Topic Date Due  . COVID-19 Vaccine (4 - Booster for Pfizer series) 09/06/2020    Colorectal cancer screening: Type of screening: Colonoscopy. Completed 10/19/13. Repeat every 10 years  Mammogram status: Completed 11/01/19. Repeat every year  Bone Density status: Completed 11/01/19. Results reflect: Bone density results: OSTEOPENIA. Repeat every 2 years.  Lung Cancer Screening: (Low Dose CT Chest recommended if Age 83-80 years, 30 pack-year currently smoking OR have quit w/in 15years.) does not qualify.   Additional Screening:  Hepatitis C Screening: does qualify; Completed 08/16/15  Vision Screening: Recommended annual ophthalmology exams for early detection of glaucoma and other disorders of the eye. Is the patient up to date with their annual eye exam?  Yes  Who is the provider or what is the name of the office in which the patient attends annual eye exams? [facility].   Dental Screening: Recommended annual dental exams for proper oral hygiene  Community Resource Referral / Chronic Care Management: CRR required this visit?  No   CCM required this visit?  No      Plan:     I have personally reviewed and noted the following in the patient's chart:   . Medical  and social history . Use of alcohol, tobacco or illicit drugs  . Current medications and supplements . Functional ability and status . Nutritional status . Physical activity . Advanced directives . List of other physicians . Hospitalizations, surgeries, and ER visits in previous 12 months . Vitals . Screenings to include cognitive, depression, and falls . Referrals and appointments  In addition, I have reviewed and discussed with patient certain preventive protocols, quality metrics, and best practice recommendations. A written personalized care plan for preventive services as well as general preventive health recommendations were provided to patient.     Reather Littler, LPN   [id number]   Nurse Notes: pt c/o fatigue since having Covid in Feb 2022; pt also states vertigo started after covid and has had one fall since then but no injury. Pt states she has tried meclizine for vertigo but it did not help. Pt plans to discuss at CPE on 10/02/20.   Pt would also like ain rx for hemorrhoid creams of 2.5% hydrocortisone cream and the suppositories; pt states she has had these before and worked well and believes she may have caused them to reappear when putting mulch out in the yard.

## 2020-09-13 ENCOUNTER — Encounter: Payer: Medicare PPO | Admitting: Internal Medicine

## 2020-09-20 DIAGNOSIS — Z23 Encounter for immunization: Secondary | ICD-10-CM | POA: Diagnosis not present

## 2020-10-02 ENCOUNTER — Ambulatory Visit (INDEPENDENT_AMBULATORY_CARE_PROVIDER_SITE_OTHER): Payer: Medicare PPO | Admitting: Internal Medicine

## 2020-10-02 ENCOUNTER — Other Ambulatory Visit: Payer: Self-pay

## 2020-10-02 ENCOUNTER — Encounter: Payer: Self-pay | Admitting: Internal Medicine

## 2020-10-02 VITALS — BP 144/84 | HR 73 | Temp 98.0°F | Ht 64.0 in | Wt 157.0 lb

## 2020-10-02 DIAGNOSIS — J4520 Mild intermittent asthma, uncomplicated: Secondary | ICD-10-CM | POA: Diagnosis not present

## 2020-10-02 DIAGNOSIS — K649 Unspecified hemorrhoids: Secondary | ICD-10-CM | POA: Diagnosis not present

## 2020-10-02 DIAGNOSIS — Z0000 Encounter for general adult medical examination without abnormal findings: Secondary | ICD-10-CM | POA: Diagnosis not present

## 2020-10-02 DIAGNOSIS — I10 Essential (primary) hypertension: Secondary | ICD-10-CM | POA: Diagnosis not present

## 2020-10-02 DIAGNOSIS — E782 Mixed hyperlipidemia: Secondary | ICD-10-CM

## 2020-10-02 DIAGNOSIS — F411 Generalized anxiety disorder: Secondary | ICD-10-CM

## 2020-10-02 DIAGNOSIS — Z1231 Encounter for screening mammogram for malignant neoplasm of breast: Secondary | ICD-10-CM | POA: Diagnosis not present

## 2020-10-02 LAB — POCT URINALYSIS DIPSTICK
Bilirubin, UA: NEGATIVE
Blood, UA: NEGATIVE
Glucose, UA: NEGATIVE
Ketones, UA: 80
Leukocytes, UA: NEGATIVE
Nitrite, UA: NEGATIVE
Protein, UA: NEGATIVE
Spec Grav, UA: >=1.03 — AB (ref 1.010–1.025)
Urobilinogen, UA: 0.2 E.U./dL
pH, UA: 6 (ref 5.0–8.0)

## 2020-10-02 MED ORDER — HYDROCORTISONE (PERIANAL) 2.5 % EX CREA: 1.0000 "application " | TOPICAL_CREAM | Freq: Two times a day (BID) | CUTANEOUS | 1 refills | Status: DC

## 2020-10-02 MED ORDER — HYDROCORTISONE ACETATE 25 MG RE SUPP: 25.0000 mg | Freq: Two times a day (BID) | RECTAL | 0 refills | Status: DC

## 2020-10-02 MED ORDER — METOPROLOL SUCCINATE ER 25 MG PO TB24
25.0000 mg | ORAL_TABLET | Freq: Every day | ORAL | 1 refills | Status: DC
Start: 2020-10-02 — End: 2021-04-07

## 2020-10-02 NOTE — Progress Notes (Signed)
Date:  10/02/2020   Name:  Sarah Hernandez   DOB:  1950-09-13   MRN:  [id number]   Chief Complaint: Annual Exam (Breast exam no pap)  Sarah Hernandez is a 70 y.o. female who presents today for her Complete Annual Exam. She feels fairly well. She reports exercising walking regularly. She reports she is sleeping fairly well. Breast complaints -none.  Mammogram: 10/2019 DEXA: 10/2019 mild osteopenia Pap smear: discontinued Colonoscopy: 09/2013 repeat 2025  Immunization History  Administered Date(s) Administered  . Fluad Quad(high Dose 65+) 01/20/2019, 02/16/2020  . Influenza, High Dose Seasonal PF 02/02/2018  . Influenza,inj,Quad PF,6+ Mos 03/08/2015, 02/14/2016, 01/28/2017  . Influenza-Unspecified 03/08/2015, 02/14/2016, 01/28/2017  . PFIZER(Purple Top)SARS-COV-2 Vaccination 07/17/2019, 08/08/2019, 03/08/2020, 09/20/2020  . Pneumococcal Conjugate-13 08/10/2014  . Pneumococcal Polysaccharide-23 06/15/2013  . Tdap 04/15/2007, 05/28/2008, 10/28/2018  . Zoster 05/28/2012  . Zoster Recombinat (Shingrix) 03/07/2019, 05/07/2019    Hypertension This is a chronic problem. The problem is controlled (moderately high at home). Pertinent negatives include no chest pain, headaches, palpitations or shortness of breath. Past treatments include beta blockers and ACE inhibitors. The current treatment provides significant improvement.  Hyperlipidemia This is a chronic problem. The problem is controlled. Pertinent negatives include no chest pain or shortness of breath. Current antihyperlipidemic treatment includes statins. There are no compliance problems.   Asthma There is no cough, shortness of breath or wheezing. This is a recurrent problem. The problem has been unchanged. Associated symptoms include heartburn. Pertinent negatives include no chest pain, fever, headaches or trouble swallowing. Her symptoms are alleviated by beta-agonist and steroid inhaler. Her past medical history is significant for  asthma.  Gastroesophageal Reflux She complains of heartburn. She reports no abdominal pain, no chest pain, no coughing or no wheezing. This is a recurrent problem. The problem occurs occasionally. Pertinent negatives include no fatigue. She has tried a PPI for the symptoms.    Lab Results  Component Value Date   CREATININE 0.95 09/13/2019   BUN 13 09/13/2019   NA 142 09/13/2019   K 4.1 09/13/2019   CL 104 09/13/2019   CO2 24 09/13/2019   Lab Results  Component Value Date   CHOL 155 09/13/2019   HDL 49 09/13/2019   LDLCALC 87 09/13/2019   TRIG 101 09/13/2019   CHOLHDL 3.2 09/13/2019   Lab Results  Component Value Date   TSH 4.560 (H) 09/13/2019   No results found for: HGBA1C Lab Results  Component Value Date   WBC 6.9 09/13/2019   HGB 11.9 09/13/2019   HCT 35.9 09/13/2019   MCV 87 09/13/2019   PLT 283 09/13/2019   Lab Results  Component Value Date   ALT 17 09/13/2019   AST 17 09/13/2019   GGT 11 02/25/2019   ALKPHOS 129 (H) 09/13/2019   BILITOT 0.2 09/13/2019     Review of Systems  Constitutional: Negative for chills, fatigue and fever.  HENT: Negative for congestion, hearing loss, tinnitus, trouble swallowing and voice change.   Eyes: Negative for visual disturbance.  Respiratory: Negative for cough, chest tightness, shortness of breath and wheezing.   Cardiovascular: Negative for chest pain, palpitations and leg swelling.  Gastrointestinal: Positive for heartburn. Negative for abdominal pain, constipation, diarrhea and vomiting.       Rectal itching and burning due to hemorrhoids  Endocrine: Negative for polydipsia and polyuria.  Genitourinary: Negative for dysuria, frequency, genital sores, vaginal bleeding and vaginal discharge.  Musculoskeletal: Negative for arthralgias, gait problem and joint swelling.  Skin:  Negative for color change and rash.  Neurological: Positive for dizziness (since February Covid.) and light-headedness. Negative for tremors and  headaches.  Hematological: Negative for adenopathy. Does not bruise/bleed easily.  Psychiatric/Behavioral: Negative for dysphoric mood and sleep disturbance. The patient is not nervous/anxious.     Patient Active Problem List   Diagnosis Date Noted  . History of basal cell cancer 09/13/2019  . Generalized anxiety disorder 09/09/2018  . OAB (overactive bladder) 08/21/2016  . Esophagitis, reflux 10/05/2014  . Hemorrhoids, internal 10/05/2014  . Hyperlipidemia, mixed 10/05/2014  . Benign hypertension 10/05/2014  . Irritable bowel syndrome with constipation 10/05/2014  . Mild intermittent asthma without complication 10/05/2014  . Allergic rhinitis, seasonal 10/05/2014  . DD (diverticular disease) 12/13/2013    Allergies  Allergen Reactions  . Amlodipine Swelling    Past Surgical History:  Procedure Laterality Date  . ABDOMINAL HYSTERECTOMY  1997  . APPENDECTOMY  1997  . COLONOSCOPY  2015   diverticulosis  . PARTIAL HYSTERECTOMY  1979   bleeding from fibroids  . TONSILLECTOMY  1966  . UPPER GASTROINTESTINAL ENDOSCOPY  09/2014   gastritis/esoph; no Barrett's    Social History   Tobacco Use  . Smoking status: Never Smoker  . Smokeless tobacco: Never Used  . Tobacco comment: smoking cessation materials not required  Vaping Use  . Vaping Use: Never used  Substance Use Topics  . Alcohol use: Yes    Alcohol/week: 0.0 standard drinks    Comment: Occasionally  . Drug use: No     Medication list has been reviewed and updated.  Current Meds  Medication Sig  . albuterol (PROVENTIL) (2.5 MG/3ML) 0.083% nebulizer solution Inhale 3 mLs into the lungs 4 (four) times daily as needed. Reported on 08/16/2015  . albuterol (VENTOLIN HFA) 108 (90 Base) MCG/ACT inhaler TAKE 2 PUFFS BY MOUTH EVERY 6 HOURS AS NEEDED FOR WHEEZE OR SHORTNESS OF BREATH  . atorvastatin (LIPITOR) 40 MG tablet TAKE 1 TABLET BY MOUTH EVERYDAY AT BEDTIME  . Cyanocobalamin (VITAMIN B-12 PO) Take by mouth.  .  fluticasone-salmeterol (ADVAIR HFA) 115-21 MCG/ACT inhaler Inhale 2 puffs into the lungs 2 (two) times daily.  Marland Kitchen lisinopril (ZESTRIL) 20 MG tablet TAKE 1 TABLET BY MOUTH EVERY DAY  . metoprolol succinate (TOPROL-XL) 25 MG 24 hr tablet Take by mouth.  Marland Kitchen omeprazole (PRILOSEC) 20 MG capsule Take 1 capsule (20 mg total) by mouth daily.  . sertraline (ZOLOFT) 50 MG tablet TAKE 1 TABLET BY MOUTH EVERY DAY  . VITAMIN D PO Take by mouth daily.    PHQ 2/9 Scores 09/11/2020 06/26/2020 06/08/2020 02/16/2020  PHQ - 2 Score 0 0 0 0  PHQ- 9 Score - 0 0 0    GAD 7 : Generalized Anxiety Score 06/26/2020 06/08/2020 02/16/2020 11/24/2019  Nervous, Anxious, on Edge 0 0 0 0  Control/stop worrying 0 0 0 0  Worry too much - different things 0 0 0 0  Trouble relaxing 0 0 0 0  Restless 0 0 0 0  Easily annoyed or irritable 0 0 0 0  Afraid - awful might happen 0 0 0 0  Total GAD 7 Score 0 0 0 0  Anxiety Difficulty - - Not difficult at all Not difficult at all    BP Readings from Last 3 Encounters:  10/02/20 (!) 144/84  09/11/20 (!) 152/76  07/13/20 (!) 180/102    Physical Exam Vitals and nursing note reviewed.  Constitutional:      General: She is not in acute  distress.    Appearance: She is well-developed.  HENT:     Head: Normocephalic and atraumatic.     Right Ear: Tympanic membrane and ear canal normal. No middle ear effusion. Tympanic membrane is not erythematous.     Left Ear: Tympanic membrane and ear canal normal.  No middle ear effusion. Tympanic membrane is not erythematous.     Nose:     Right Sinus: No maxillary sinus tenderness or frontal sinus tenderness.     Left Sinus: No maxillary sinus tenderness or frontal sinus tenderness.  Eyes:     General: No scleral icterus.       Right eye: No discharge.        Left eye: No discharge.     Conjunctiva/sclera: Conjunctivae normal.  Neck:     Thyroid: No thyromegaly.     Vascular: No carotid bruit.  Cardiovascular:     Rate and Rhythm: Normal  rate and regular rhythm.     Pulses: Normal pulses.     Heart sounds: Normal heart sounds.  Pulmonary:     Effort: Pulmonary effort is normal. No respiratory distress.     Breath sounds: No wheezing.  Chest:  Breasts:     Right: No mass, nipple discharge, skin change or tenderness.     Left: No mass, nipple discharge, skin change or tenderness.    Abdominal:     General: Bowel sounds are normal.     Palpations: Abdomen is soft.     Tenderness: There is no abdominal tenderness.  Genitourinary:    Comments: Declines rectal exam Musculoskeletal:     Cervical back: Normal range of motion. No erythema.     Right lower leg: No edema.     Left lower leg: No edema.  Lymphadenopathy:     Cervical: No cervical adenopathy.  Skin:    General: Skin is warm and dry.     Findings: No rash.  Neurological:     Mental Status: She is alert and oriented to person, place, and time.     Cranial Nerves: No cranial nerve deficit.     Sensory: No sensory deficit.     Deep Tendon Reflexes: Reflexes are normal and symmetric.  Psychiatric:        Attention and Perception: Attention normal.        Mood and Affect: Mood normal.        Speech: Speech normal.     Wt Readings from Last 3 Encounters:  10/02/20 157 lb (71.2 kg)  09/11/20 159 lb (72.1 kg)  06/26/20 159 lb (72.1 kg)    BP (!) 144/84   Pulse 73   Temp 98 F (36.7 C) (Oral)   Ht 5\' 4"  (1.626 m)   Wt 157 lb (71.2 kg)   SpO2 97%   BMI 26.95 kg/m   Assessment and Plan: 1. Annual physical exam Normal exam Immunizations and screenings are up to date  2. Encounter for screening mammogram for breast cancer Pt is scheduled at [facility] imaging for June  3. Benign hypertension Clinically stable exam with fairly well controlled BP on metoprolol and lisinopril . Tolerating medications without side effects at this time. Pt to continue current regimen and low sodium diet; benefits of regular exercise as able discussed. - CBC with  Differential/Platelet - Comprehensive metabolic panel - TSH - POCT urinalysis dipstick  4. Hyperlipidemia, mixed Tolerating statin medication without side effects at this time LDL is at goal of < 70 on current dose Continue  same therapy without change at this time. - Lipid panel  5. Generalized anxiety disorder Doing well on Zoloft.  No SI/HI. Continue current therapy.  6. Mild intermittent asthma without complication Mild intermittent symptoms Continue Advair and PRN albuterol  7. Hemorrhoids, unspecified hemorrhoid type Long-standing recurrent symptoms without bleeding Colonoscopy done 2015 and was normal. - hydrocortisone (ANUSOL-HC) 25 MG suppository; Place 1 suppository (25 mg total) rectally 2 (two) times daily.  Dispense: 12 suppository; Refill: 0 - hydrocortisone (ANUSOL-HC) 2.5 % rectal cream; Place 1 application rectally 2 (two) times daily.  Dispense: 30 g; Refill: 1   Partially dictated using Animal nutritionistDragon software. Any errors are unintentional.  Bari EdwardLaura Adonica Fukushima, MD [facility]  10/02/2020

## 2020-10-04 DIAGNOSIS — E782 Mixed hyperlipidemia: Secondary | ICD-10-CM | POA: Diagnosis not present

## 2020-10-04 DIAGNOSIS — I10 Essential (primary) hypertension: Secondary | ICD-10-CM | POA: Diagnosis not present

## 2020-10-05 LAB — COMPREHENSIVE METABOLIC PANEL
ALT: 23 IU/L (ref 0–32)
AST: 14 IU/L (ref 0–40)
Albumin/Globulin Ratio: 1.6 (ref 1.2–2.2)
Albumin: 4.1 g/dL (ref 3.7–4.7)
Alkaline Phosphatase: 97 IU/L (ref 44–121)
BUN/Creatinine Ratio: 15 (ref 12–28)
BUN: 14 mg/dL (ref 8–27)
Bilirubin Total: 0.3 mg/dL (ref 0.0–1.2)
CO2: 21 mmol/L (ref 20–29)
Calcium: 9.1 mg/dL (ref 8.7–10.3)
Chloride: 106 mmol/L (ref 96–106)
Creatinine, Ser: 0.93 mg/dL (ref 0.57–1.00)
Globulin, Total: 2.5 g/dL (ref 1.5–4.5)
Glucose: 90 mg/dL (ref 65–99)
Potassium: 4.7 mmol/L (ref 3.5–5.2)
Sodium: 140 mmol/L (ref 134–144)
Total Protein: 6.6 g/dL (ref 6.0–8.5)
eGFR: 65 mL/min/{1.73_m2} (ref >59)

## 2020-10-05 LAB — LIPID PANEL
Chol/HDL Ratio: 3.9 ratio (ref 0.0–4.4)
Cholesterol, Total: 189 mg/dL (ref 100–199)
HDL: 49 mg/dL (ref >39)
LDL Chol Calc (NIH): 114 mg/dL — ABNORMAL HIGH (ref 0–99)
Triglycerides: 148 mg/dL (ref 0–149)
VLDL Cholesterol Cal: 26 mg/dL (ref 5–40)

## 2020-10-05 LAB — CBC WITH DIFFERENTIAL/PLATELET
Basophils Absolute: 0.1 10*3/uL (ref 0.0–0.2)
Basos: 1 %
EOS (ABSOLUTE): 0.4 10*3/uL (ref 0.0–0.4)
Eos: 5 %
Hematocrit: 35.3 % (ref 34.0–46.6)
Hemoglobin: 12.1 g/dL (ref 11.1–15.9)
Immature Grans (Abs): 0 10*3/uL (ref 0.0–0.1)
Immature Granulocytes: 1 %
Lymphocytes Absolute: 2.4 10*3/uL (ref 0.7–3.1)
Lymphs: 35 %
MCH: 29.9 pg (ref 26.6–33.0)
MCHC: 34.3 g/dL (ref 31.5–35.7)
MCV: 87 fL (ref 79–97)
Monocytes Absolute: 0.4 10*3/uL (ref 0.1–0.9)
Monocytes: 6 %
Neutrophils Absolute: 3.6 10*3/uL (ref 1.4–7.0)
Neutrophils: 52 %
Platelets: 273 10*3/uL (ref 150–450)
RBC: 4.05 x10E6/uL (ref 3.77–5.28)
RDW: 13.2 % (ref 11.7–15.4)
WBC: 6.9 10*3/uL (ref 3.4–10.8)

## 2020-10-05 LAB — TSH: TSH: 5.27 u[IU]/mL — ABNORMAL HIGH (ref 0.450–4.500)

## 2020-10-07 ENCOUNTER — Other Ambulatory Visit: Payer: Self-pay | Admitting: Internal Medicine

## 2020-10-07 DIAGNOSIS — I10 Essential (primary) hypertension: Secondary | ICD-10-CM

## 2020-10-22 ENCOUNTER — Other Ambulatory Visit: Payer: Self-pay | Admitting: Internal Medicine

## 2020-10-22 DIAGNOSIS — K2100 Gastro-esophageal reflux disease with esophagitis, without bleeding: Secondary | ICD-10-CM

## 2020-10-24 NOTE — Telephone Encounter (Signed)
Requested Prescriptions  Pending Prescriptions Disp Refills  . omeprazole (PRILOSEC) 20 MG capsule [Pharmacy Med Name: OMEPRAZOLE DR 20 MG CAPSULE] 90 capsule 3    Sig: TAKE 1 CAPSULE BY MOUTH EVERY DAY     Gastroenterology: Proton Pump Inhibitors Passed - 10/22/2020  6:10 PM      Passed - Valid encounter within last 12 months    Recent Outpatient Visits          3 weeks ago Annual physical exam   [facility] Reubin Milan, MD   3 months ago COVID-19   [facility] Reubin Milan, MD   4 months ago Moderate persistent asthma with exacerbation   [facility] Reubin Milan, MD   4 months ago Asthma in adult, mild intermittent, with acute exacerbation   [facility] Reubin Milan, MD   8 months ago Benign hypertension   [facility] Reubin Milan, MD      Future Appointments            In 4 months Judithann Graves Nyoka Cowden, MD [facility], [facility]   In 11 months Judithann Graves Nyoka Cowden, MD [facility], [facility]

## 2020-10-31 ENCOUNTER — Other Ambulatory Visit: Payer: Self-pay | Admitting: Internal Medicine

## 2020-10-31 DIAGNOSIS — F411 Generalized anxiety disorder: Secondary | ICD-10-CM

## 2020-10-31 NOTE — Telephone Encounter (Signed)
Requested Prescriptions  Pending Prescriptions Disp Refills  . sertraline (ZOLOFT) 50 MG tablet [Pharmacy Med Name: SERTRALINE HCL 50 MG TABLET] 90 tablet 1    Sig: TAKE 1 TABLET BY MOUTH EVERY DAY     Psychiatry:  Antidepressants - SSRI Passed - 10/31/2020  7:12 PM      Passed - Valid encounter within last 6 months    Recent Outpatient Visits          4 weeks ago Annual physical exam   [facility] Reubin Milan, MD   3 months ago COVID-19   [facility] Reubin Milan, MD   4 months ago Moderate persistent asthma with exacerbation   [facility] Reubin Milan, MD   4 months ago Asthma in adult, mild intermittent, with acute exacerbation   [facility] Reubin Milan, MD   8 months ago Benign hypertension   [facility] Reubin Milan, MD      Future Appointments            In 4 months Judithann Graves Nyoka Cowden, MD [facility], [facility]   In 11 months Judithann Graves Nyoka Cowden, MD [facility], [facility]

## 2020-11-03 ENCOUNTER — Other Ambulatory Visit: Payer: Self-pay | Admitting: Internal Medicine

## 2020-11-03 DIAGNOSIS — E782 Mixed hyperlipidemia: Secondary | ICD-10-CM

## 2020-11-05 ENCOUNTER — Encounter: Payer: Self-pay | Admitting: Internal Medicine

## 2020-11-06 DIAGNOSIS — Z1231 Encounter for screening mammogram for malignant neoplasm of breast: Secondary | ICD-10-CM | POA: Diagnosis not present

## 2020-11-16 IMAGING — CR DG CHEST 2V
2 series · 2 of 2 positions shown · non-contrast
Comparison: CT 07/12/2009.

CLINICAL DATA: Chest pain.

EXAM:
CHEST - 2 VIEW

[chest pa]
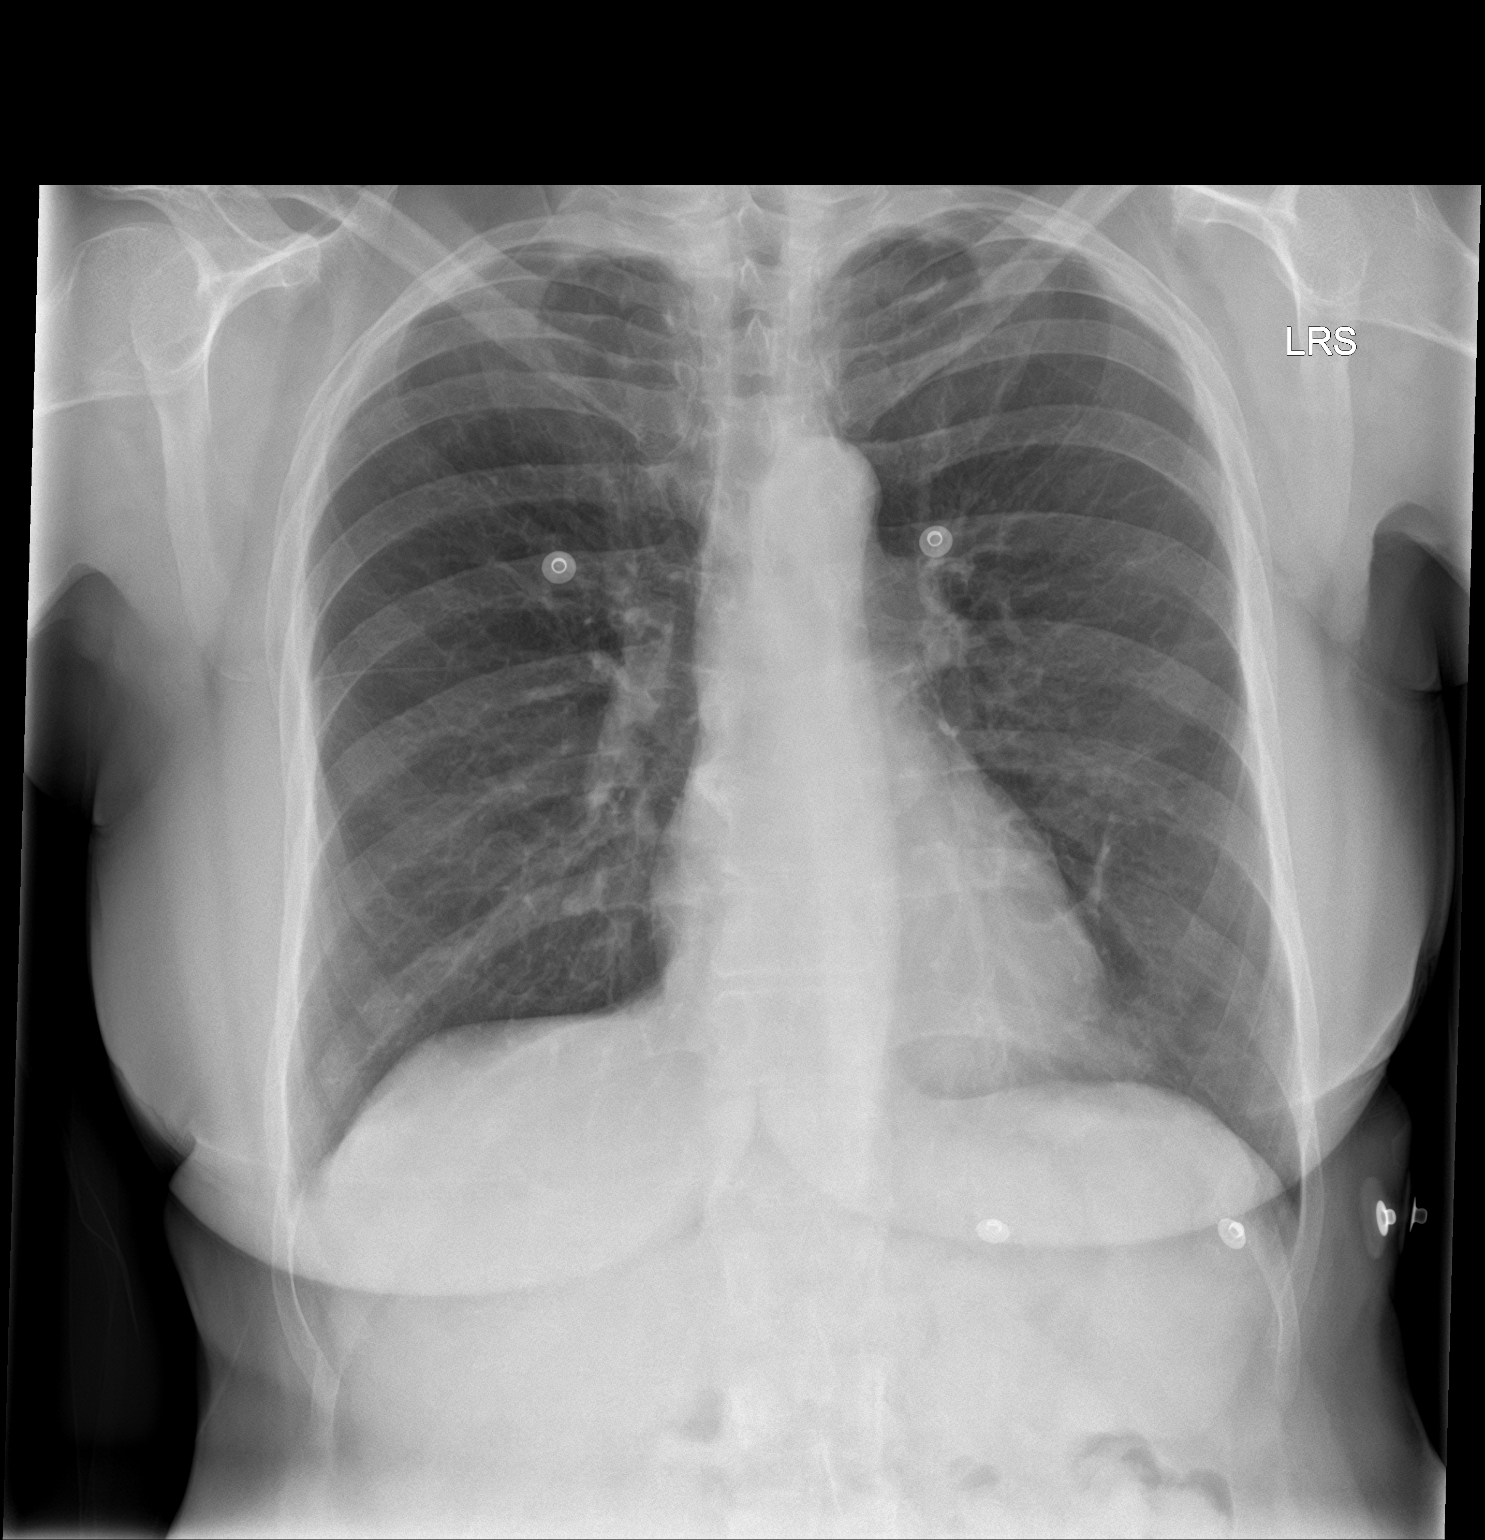

[chest lat]
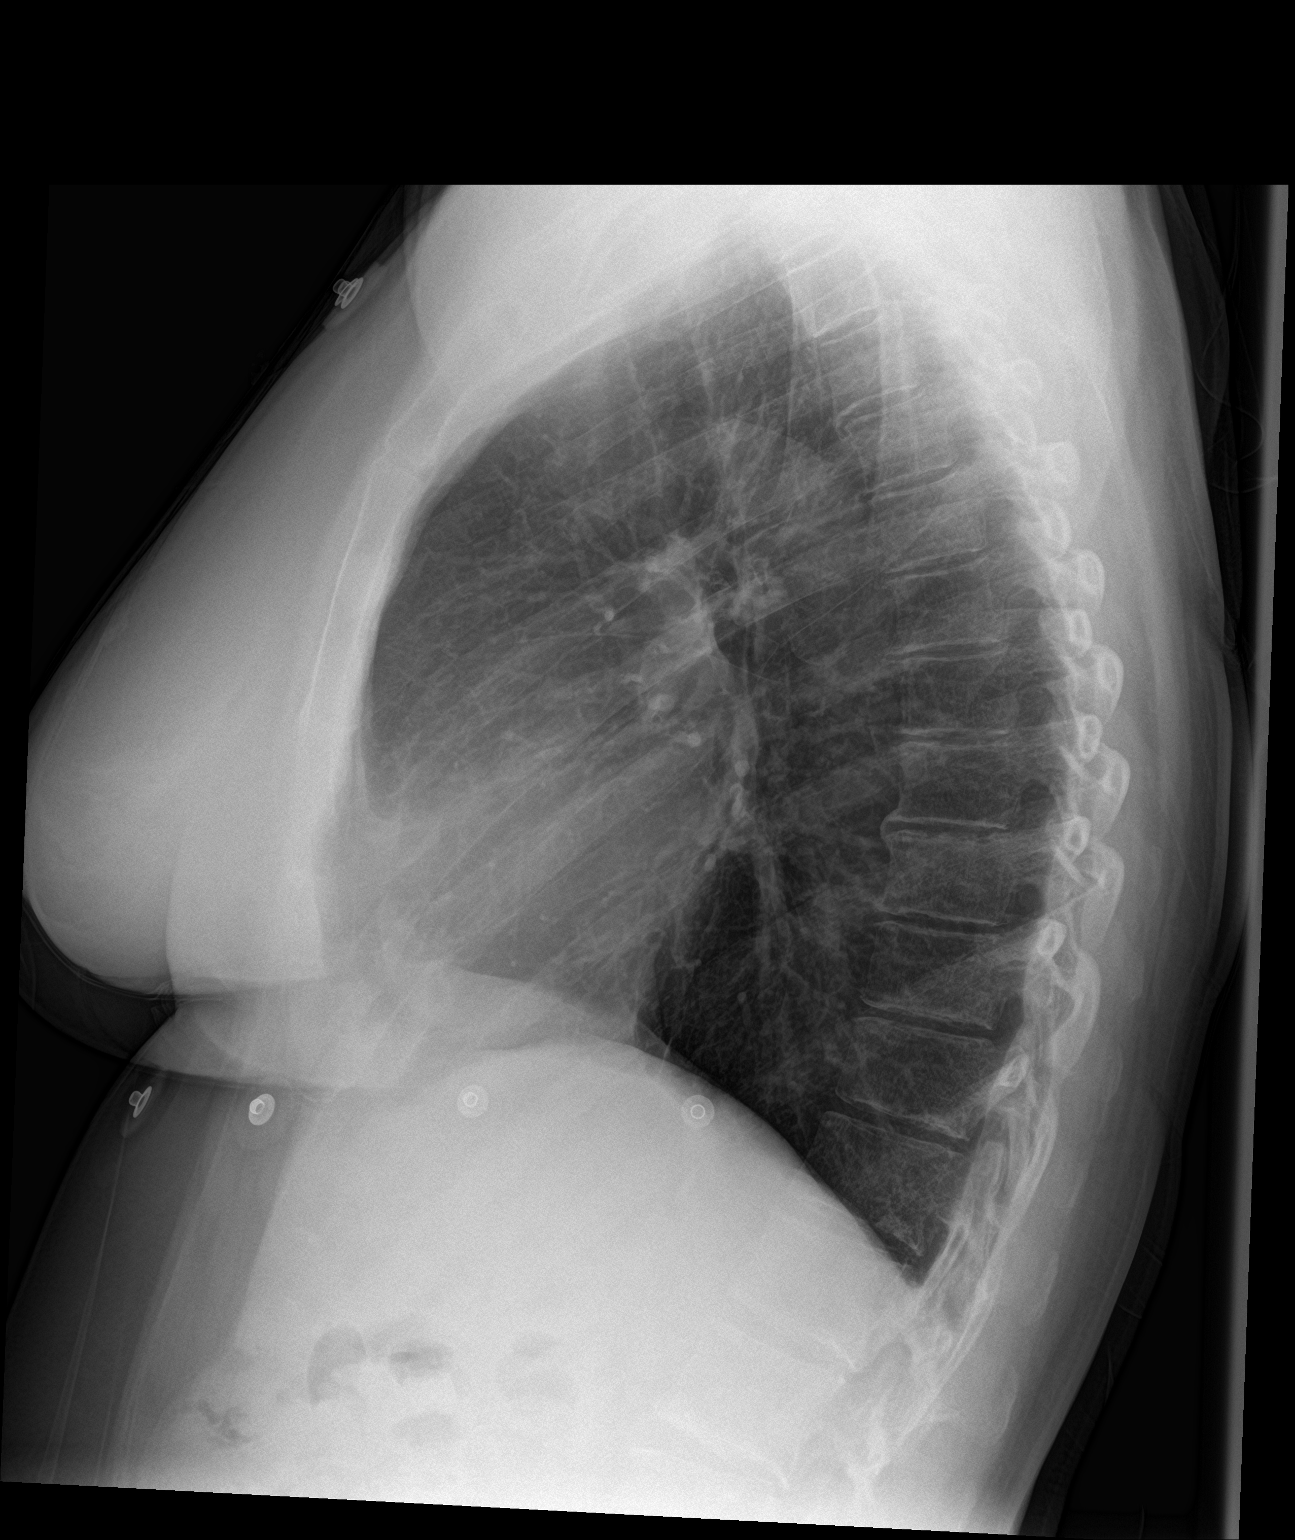

[2 of 2 positions shown; findings below may reference images not displayed]

FINDINGS: Mediastinum hilar structures normal. Lungs are clear. Mild left base
pleural-parenchymal thickening most consistent scarring. No pleural
effusion or pneumothorax.
IMPRESSION: No acute cardiopulmonary disease.

## 2020-11-28 ENCOUNTER — Other Ambulatory Visit: Payer: Self-pay

## 2020-11-28 ENCOUNTER — Encounter: Payer: Self-pay | Admitting: Internal Medicine

## 2020-11-28 DIAGNOSIS — K5732 Diverticulitis of large intestine without perforation or abscess without bleeding: Secondary | ICD-10-CM

## 2020-11-28 MED ORDER — PROMETHAZINE HCL 25 MG PO TABS: 25.0000 mg | ORAL_TABLET | Freq: Three times a day (TID) | ORAL | 0 refills | Status: DC | PRN

## 2021-01-07 ENCOUNTER — Encounter: Payer: Self-pay | Admitting: Internal Medicine

## 2021-01-09 ENCOUNTER — Ambulatory Visit: Payer: Medicare PPO | Admitting: Internal Medicine

## 2021-01-09 ENCOUNTER — Encounter: Payer: Self-pay | Admitting: Internal Medicine

## 2021-01-09 ENCOUNTER — Other Ambulatory Visit: Payer: Self-pay

## 2021-01-09 VITALS — BP 118/72 | HR 73 | Temp 98.1°F | Ht 64.0 in | Wt 160.0 lb

## 2021-01-09 DIAGNOSIS — K5732 Diverticulitis of large intestine without perforation or abscess without bleeding: Secondary | ICD-10-CM

## 2021-01-09 MED ORDER — AMOXICILLIN-POT CLAVULANATE 875-125 MG PO TABS: 1.0000 | ORAL_TABLET | Freq: Two times a day (BID) | ORAL | 0 refills | Status: AC

## 2021-01-09 NOTE — Progress Notes (Signed)
Date:  01/09/2021   Name:  Sarah Hernandez   DOB:  [birth year]   MRN:  [id number]   Chief Complaint: Abdominal Pain (LLQ; since last Wednesday, 01/03/21;  inconsistent bowel movements from diarrhea to constipation; taking hyoscyamine 0.125 mg with some relief; 5/10 pain)  Abdominal Pain This is a new problem. The current episode started in the past 7 days. The problem occurs constantly. The problem has been unchanged. The abdominal pain radiates to the LLQ. Associated symptoms include diarrhea, a fever (low grade fever reduced by advil) and nausea. Pertinent negatives include no anorexia, belching, hematochezia, melena or vomiting. The pain is aggravated by palpation. The pain is relieved by Nothing. Treatments tried: Bentyl. The treatment provided no relief. diverticulitis   Lab Results  Component Value Date   CREATININE 0.93 10/04/2020   BUN 14 10/04/2020   NA 140 10/04/2020   K 4.7 10/04/2020   CL 106 10/04/2020   CO2 21 10/04/2020   Lab Results  Component Value Date   CHOL 189 10/04/2020   HDL 49 10/04/2020   LDLCALC 114 (H) 10/04/2020   TRIG 148 10/04/2020   CHOLHDL 3.9 10/04/2020   Lab Results  Component Value Date   TSH 5.270 (H) 10/04/2020   No results found for: HGBA1C Lab Results  Component Value Date   WBC 6.9 10/04/2020   HGB 12.1 10/04/2020   HCT 35.3 10/04/2020   MCV 87 10/04/2020   PLT 273 10/04/2020   Lab Results  Component Value Date   ALT 23 10/04/2020   AST 14 10/04/2020   GGT 11 02/25/2019   ALKPHOS 97 10/04/2020   BILITOT 0.3 10/04/2020     Review of Systems  Constitutional:  Positive for chills, diaphoresis and fever (low grade fever reduced by advil).  Respiratory:  Negative for cough and chest tightness.   Gastrointestinal:  Positive for abdominal pain, diarrhea and nausea. Negative for anorexia, blood in stool, hematochezia, melena and vomiting.  Psychiatric/Behavioral:  Negative for dysphoric mood and sleep disturbance. The patient is  not nervous/anxious.    Patient Active Problem List   Diagnosis Date Noted   History of basal cell cancer 09/13/2019   Generalized anxiety disorder 09/09/2018   OAB (overactive bladder) 08/21/2016   Esophagitis, reflux 10/05/2014   Hemorrhoids, internal 10/05/2014   Hyperlipidemia, mixed 10/05/2014   Benign hypertension 10/05/2014   Irritable bowel syndrome with constipation 10/05/2014   Mild intermittent asthma without complication 10/05/2014   Allergic rhinitis, seasonal 10/05/2014   DD (diverticular disease) 12/13/2013    Allergies  Allergen Reactions   Amlodipine Swelling   Flagyl [Metronidazole] Nausea Only    Past Surgical History:  Procedure Laterality Date   ABDOMINAL HYSTERECTOMY  1997   APPENDECTOMY  1997   COLONOSCOPY  2015   diverticulosis   PARTIAL HYSTERECTOMY  1979   bleeding from fibroids   TONSILLECTOMY  1966   UPPER GASTROINTESTINAL ENDOSCOPY  09/2014   gastritis/esoph; no Barrett's    Social History   Tobacco Use   Smoking status: Never   Smokeless tobacco: Never  Vaping Use   Vaping Use: Never used  Substance Use Topics   Alcohol use: Yes   Drug use: Not Currently     Medication list has been reviewed and updated.  Current Meds  Medication Sig   albuterol (PROVENTIL) (2.5 MG/3ML) 0.083% nebulizer solution Inhale 3 mLs into the lungs 4 (four) times daily as needed. Reported on 08/16/2015   albuterol (VENTOLIN HFA) 108 (90 Base) MCG/ACT  inhaler TAKE 2 PUFFS BY MOUTH EVERY 6 HOURS AS NEEDED FOR WHEEZE OR SHORTNESS OF BREATH   atorvastatin (LIPITOR) 40 MG tablet TAKE 1 TABLET BY MOUTH EVERYDAY AT BEDTIME   Cyanocobalamin (VITAMIN B-12 PO) Place 1 drop under the tongue daily.   fluticasone-salmeterol (ADVAIR HFA) 115-21 MCG/ACT inhaler Inhale 2 puffs into the lungs 2 (two) times daily.   hydrocortisone (ANUSOL-HC) 2.5 % rectal cream Place 1 application rectally 2 (two) times daily.   hyoscyamine (LEVSIN) 0.125 MG tablet Take 0.125 mg by mouth  every 4 (four) hours as needed.   lisinopril (ZESTRIL) 20 MG tablet TAKE 1 TABLET BY MOUTH EVERY DAY   metoprolol succinate (TOPROL-XL) 25 MG 24 hr tablet Take 1 tablet (25 mg total) by mouth daily.   nitroGLYCERIN (NITROSTAT) 0.4 MG SL tablet Place 1 tablet (0.4 mg total) under the tongue every 5 (five) minutes as needed for chest pain.   omeprazole (PRILOSEC) 20 MG capsule TAKE 1 CAPSULE BY MOUTH EVERY DAY   promethazine (PHENERGAN) 25 MG tablet Take 1 tablet (25 mg total) by mouth every 8 (eight) hours as needed for nausea or vomiting (Vertigo).   sertraline (ZOLOFT) 50 MG tablet TAKE 1 TABLET BY MOUTH EVERY DAY   VITAMIN D PO Take 2,000 Units by mouth daily.    PHQ 2/9 Scores 09/11/2020 06/26/2020 06/08/2020 02/16/2020  PHQ - 2 Score 0 0 0 0  PHQ- 9 Score - 0 0 0    GAD 7 : Generalized Anxiety Score 06/26/2020 06/08/2020 02/16/2020 11/24/2019  Nervous, Anxious, on Edge 0 0 0 0  Control/stop worrying 0 0 0 0  Worry too much - different things 0 0 0 0  Trouble relaxing 0 0 0 0  Restless 0 0 0 0  Easily annoyed or irritable 0 0 0 0  Afraid - awful might happen 0 0 0 0  Total GAD 7 Score 0 0 0 0  Anxiety Difficulty - - Not difficult at all Not difficult at all    BP Readings from Last 3 Encounters:  01/09/21 118/72  10/02/20 (!) 144/84  09/11/20 (!) 152/76    Physical Exam Vitals and nursing note reviewed.  Constitutional:      General: She is not in acute distress.    Appearance: She is well-developed.  HENT:     Head: Normocephalic and atraumatic.  Pulmonary:     Effort: Pulmonary effort is normal. No respiratory distress.  Abdominal:     General: Abdomen is flat. Bowel sounds are decreased.     Palpations: Abdomen is soft.     Tenderness: There is abdominal tenderness in the left lower quadrant. There is no guarding or rebound.  Skin:    General: Skin is warm and dry.     Findings: No rash.  Neurological:     Mental Status: She is alert and oriented to person, place, and  time.  Psychiatric:        Mood and Affect: Mood normal.        Behavior: Behavior normal.    Wt Readings from Last 3 Encounters:  01/09/21 160 lb (72.6 kg)  10/02/20 157 lb (71.2 kg)  09/11/20 159 lb (72.1 kg)    BP 118/72 (BP Location: Right Arm, Patient Position: Sitting, Cuff Size: Normal)   Pulse 73   Temp 98.1 F (36.7 C) (Oral)   Ht 5\' 4"  (1.626 m)   Wt 160 lb (72.6 kg)   SpO2 96%   BMI 27.46 kg/m  Assessment and Plan: 1. Diverticulitis of large intestine without perforation or abscess without bleeding Continue bland diet and sufficient fluids Need to consider colonoscopy in the next 3-6 months for follow up - amoxicillin-clavulanate (AUGMENTIN) 875-125 MG tablet; Take 1 tablet by mouth 2 (two) times daily for 10 days.  Dispense: 20 tablet; Refill: 0 - CBC with Differential/Platelet - C-reactive protein   Partially dictated using Animal nutritionist. Any errors are unintentional.  Bari Edward, MD [facility]  01/09/2021

## 2021-01-10 LAB — CBC WITH DIFFERENTIAL/PLATELET
Basophils Absolute: 0.1 10*3/uL (ref 0.0–0.2)
Basos: 1 %
EOS (ABSOLUTE): 0.2 10*3/uL (ref 0.0–0.4)
Eos: 3 %
Hematocrit: 34.6 % (ref 34.0–46.6)
Hemoglobin: 12 g/dL (ref 11.1–15.9)
Immature Grans (Abs): 0 10*3/uL (ref 0.0–0.1)
Immature Granulocytes: 1 %
Lymphocytes Absolute: 1.8 10*3/uL (ref 0.7–3.1)
Lymphs: 23 %
MCH: 29.9 pg (ref 26.6–33.0)
MCHC: 34.7 g/dL (ref 31.5–35.7)
MCV: 86 fL (ref 79–97)
Monocytes Absolute: 0.4 10*3/uL (ref 0.1–0.9)
Monocytes: 5 %
Neutrophils Absolute: 5.5 10*3/uL (ref 1.4–7.0)
Neutrophils: 67 %
Platelets: 300 10*3/uL (ref 150–450)
RBC: 4.01 x10E6/uL (ref 3.77–5.28)
RDW: 12 % (ref 11.7–15.4)
WBC: 8 10*3/uL (ref 3.4–10.8)

## 2021-01-10 LAB — C-REACTIVE PROTEIN: CRP: <1 mg/L (ref 0–10)

## 2021-03-22 ENCOUNTER — Ambulatory Visit: Payer: Medicare PPO | Admitting: Internal Medicine

## 2021-04-06 ENCOUNTER — Other Ambulatory Visit: Payer: Self-pay | Admitting: Internal Medicine

## 2021-04-06 DIAGNOSIS — I10 Essential (primary) hypertension: Secondary | ICD-10-CM

## 2021-04-07 NOTE — Telephone Encounter (Signed)
Requested Prescriptions  Pending Prescriptions Disp Refills  . lisinopril (ZESTRIL) 20 MG tablet [Pharmacy Med Name: LISINOPRIL 20 MG TABLET] 90 tablet 0    Sig: TAKE 1 TABLET BY MOUTH EVERY DAY     Cardiovascular:  ACE Inhibitors Failed - 04/06/2021  2:53 PM      Failed - Cr in normal range and within 180 days    Creatinine, Ser  Date Value Ref Range Status  10/04/2020 0.93 0.57 - 1.00 mg/dL Final         Failed - K in normal range and within 180 days    Potassium  Date Value Ref Range Status  10/04/2020 4.7 3.5 - 5.2 mmol/L Final         Passed - Patient is not pregnant      Passed - Last BP in normal range    BP Readings from Last 1 Encounters:  01/09/21 118/72         Passed - Valid encounter within last 6 months    Recent Outpatient Visits          2 months ago Diverticulitis of large intestine without perforation or abscess without bleeding   [facility] Reubin Milan, MD   6 months ago Annual physical exam   [facility] Re[facility], MD   8 months ago COVID-19   [facility] Reubin Milan, MD   9 months ago Moderate persistent asthma with exacerbation   [facility] Reubin Milan, MD   10 months ago Asthma in adult, mild intermittent, with acute exacerbation   [facility] Reubin Milan, MD      Future Appointments            In 6 months Reubin Milan, MD [facility], [facility]           . metoprolol succinate (TOPROL-XL) 25 MG 24 hr tablet [Pharmacy Med Name: METOPROLOL SUCC ER 25 MG TAB] 90 tablet 0    Sig: TAKE 1 TABLET (25 MG TOTAL) BY MOUTH DAILY.     Cardiovascular:  Beta Blockers Passed - 04/06/2021  2:53 PM      Passed - Last BP in normal range    BP Readings from Last 1 Encounters:  01/09/21 118/72         Passed - Last Heart Rate in normal range    Pulse Readings from Last 1 Encounters:  01/09/21 73         Passed - Valid encounter within last 6 months     Recent Outpatient Visits          2 months ago Diverticulitis of large intestine without perforation or abscess without bleeding   [facility] Reubin [facility], [facility]   6 months ago Annual physical exam   [facility] Reubin Milan, MD   8 months ago COVID-19   [facility] Re[facility], MD   9 months ago Moderate persistent asthma with exacerbation   [facility] Re[facility], MD   10 months ago Asthma in adult, mild intermittent, with acute exacerbation   [facility] Reubin Milan, MD      Future Appointments            In 6 months Judithann Graves Nyoka Cowden, MD [facility], [facility]

## 2021-04-23 ENCOUNTER — Other Ambulatory Visit: Payer: Self-pay | Admitting: Internal Medicine

## 2021-04-23 DIAGNOSIS — F411 Generalized anxiety disorder: Secondary | ICD-10-CM

## 2021-04-23 NOTE — Telephone Encounter (Signed)
Requested Prescriptions  Pending Prescriptions Disp Refills  . sertraline (ZOLOFT) 50 MG tablet [Pharmacy Med Name: SERTRALINE HCL 50 MG TABLET] 90 tablet 0    Sig: TAKE 1 TABLET BY MOUTH EVERY DAY     Psychiatry:  Antidepressants - SSRI Passed - 04/23/2021  1:12 AM      Passed - Valid encounter within last 6 months    Recent Outpatient Visits          3 months ago Diverticulitis of large intestine without perforation or abscess without bleeding   [facility] Reubin Milan, MD   6 months ago Annual physical exam   [facility] Reubin Milan, MD   9 months ago COVID-19   [facility] Reubin Milan, MD   10 months ago Moderate persistent asthma with exacerbation   [facility] Reubin Milan, MD   10 months ago Asthma in adult, mild intermittent, with acute exacerbation   [facility] Reubin Milan, MD      Future Appointments            In 5 months Judithann Graves Nyoka Cowden, MD [facility], [facility]

## 2021-06-04 DIAGNOSIS — H259 Unspecified age-related cataract: Secondary | ICD-10-CM | POA: Diagnosis not present

## 2021-06-27 ENCOUNTER — Other Ambulatory Visit: Payer: Self-pay | Admitting: Internal Medicine

## 2021-06-27 DIAGNOSIS — I10 Essential (primary) hypertension: Secondary | ICD-10-CM

## 2021-06-27 NOTE — Telephone Encounter (Signed)
Requested Prescriptions  Pending Prescriptions Disp Refills   metoprolol succinate (TOPROL-XL) 25 MG 24 hr tablet [Pharmacy Med Name: METOPROLOL SUCC ER 25 MG TAB] 90 tablet 0    Sig: TAKE 1 TABLET (25 MG TOTAL) BY MOUTH DAILY.     Cardiovascular:  Beta Blockers Passed - 06/27/2021 10:48 AM      Passed - Last BP in normal range    BP Readings from Last 1 Encounters:  01/09/21 118/72         Passed - Last Heart Rate in normal range    Pulse Readings from Last 1 Encounters:  01/09/21 73         Passed - Valid encounter within last 6 months    Recent Outpatient Visits          5 months ago Diverticulitis of large intestine without perforation or abscess without bleeding   [facility] Glean Hess, MD   8 months ago Annual physical exam   [facility] Glean Hess, MD   11 months ago [facility] Glean [facility], [facility]   1 year ago Moderate persistent asthma with exacerbation   [facility] Glean Hess, MD   1 year ago Asthma in adult, mild intermittent, with acute exacerbation   [facility] Glean Hess, MD      Future Appointments            In 3 months Army Melia Jesse Sans, MD [facility], [facility]            lisinopril (ZESTRIL) 20 MG tablet [Pharmacy Med Name: LISINOPRIL 20 MG TABLET] 90 tablet 0    Sig: TAKE 1 TABLET BY MOUTH EVERY DAY     Cardiovascular:  ACE Inhibitors Failed - 06/27/2021 10:48 AM      Failed - Cr in normal range and within 180 days    Creatinine, Ser  Date Value Ref Range Status  10/04/2020 0.93 0.57 - 1.00 mg/dL Final         Failed - K in normal range and within 180 days    Potassium  Date Value Ref Range Status  10/04/2020 4.7 3.5 - 5.2 mmol/L Final         Passed - Patient is not pregnant      Passed - Last BP in normal range    BP Readings from Last 1 Encounters:  01/09/21 118/72         Passed - Valid encounter within last 6 months    Recent  Outpatient Visits          5 months ago Diverticulitis of large intestine without perforation or abscess without bleeding   [facility] Glean Hess, MD   8 months ago Annual physical exam   [facility] Glean Hess, MD   11 months ago COVID-19   [facility] Glean Hess, MD   1 year ago Moderate persistent asthma with exacerbation   [facility] Glean Hess, MD   1 year ago Asthma in adult, mild intermittent, with acute exacerbation   [facility] Glean Hess, MD      Future Appointments            In 3 months Army Melia Jesse Sans, MD [facility], [facility]

## 2021-07-04 ENCOUNTER — Other Ambulatory Visit: Payer: Self-pay | Admitting: Internal Medicine

## 2021-07-04 DIAGNOSIS — K649 Unspecified hemorrhoids: Secondary | ICD-10-CM

## 2021-07-04 NOTE — Telephone Encounter (Signed)
Requested medication (s) are due for refill today:   Provider to review  Requested medication (s) are on the active medication list:   Yes  Future visit scheduled:   Yes   Last ordered: 10/02/2020 30 g, 1 refill  Returned because non delegated refill   Requested Prescriptions  Pending Prescriptions Disp Refills   hydrocortisone (ANUSOL-HC) 2.5 % rectal cream [Pharmacy Med Name: HYDROCORTISONE 2.5% RECTAL CRM] 30 g 1    Sig: Place 1 application rectally 2 (two) times daily.     Not Delegated - Over the Counter: OTC 2 Failed - 07/04/2021  8:06 AM      Failed - This refill cannot be delegated      Passed - Valid encounter within last 12 months    Recent Outpatient Visits           5 months ago Diverticulitis of large intestine without perforation or abscess without bleeding   [facility] Reubin Milan, MD   9 months ago Annual physical exam   [facility] Reubin Milan, MD   11 months ago COVID-19   [facility] Reubin Milan, MD   1 year ago Moderate persistent asthma with exacerbation   [facility] Reubin Milan, MD   1 year ago Asthma in adult, mild intermittent, with acute exacerbation   [facility] Reubin Milan, MD       Future Appointments             In 3 months Judithann Graves Nyoka Cowden, MD [facility], [facility]

## 2021-07-05 ENCOUNTER — Other Ambulatory Visit: Payer: Self-pay | Admitting: Internal Medicine

## 2021-07-05 DIAGNOSIS — F411 Generalized anxiety disorder: Secondary | ICD-10-CM

## 2021-07-05 NOTE — Telephone Encounter (Signed)
Requested medications are due for refill today.  yes  Requested medications are on the active medications list.  yes  Last refill. 04/23/2021 #90 0 refills  Future visit scheduled.   yes  Notes to clinic.  Medication not delegated.    Requested Prescriptions  Pending Prescriptions Disp Refills   sertraline (ZOLOFT) 50 MG tablet [Pharmacy Med Name: SERTRALINE HCL 50 MG TABLET] 90 tablet 0    Sig: TAKE 1 TABLET BY MOUTH EVERY DAY     Not Delegated - Psychiatry:  Antidepressants - SSRI - sertraline Failed - 07/05/2021  7:18 PM      Failed - This refill cannot be delegated      Passed - AST in normal range and within 360 days    AST  Date Value Ref Range Status  10/04/2020 14 0 - 40 IU/L Final          Passed - ALT in normal range and within 360 days    ALT  Date Value Ref Range Status  10/04/2020 23 0 - 32 IU/L Final          Passed - Completed PHQ-2 or PHQ-9 in the last 360 days      Passed - Valid encounter within last 6 months    Recent Outpatient Visits           5 months ago Diverticulitis of large intestine without perforation or abscess without bleeding   [facility] Re[facility] Milan, MD   9 months ago Annual physical exam   [facility] Reubin [facility], MD   11 months ago COVID-19   [facility] Reubin Milan, MD   1 year ago Moderate persistent asthma with exacerbation   [facility] Reubin Milan, MD   1 year ago Asthma in adult, mild intermittent, with acute exacerbation   [facility] Reubin Milan, MD       Future Appointments             In 3 months Judithann Graves Nyoka Cowden, MD [facility], [facility]

## 2021-07-10 ENCOUNTER — Other Ambulatory Visit: Payer: Self-pay | Admitting: Internal Medicine

## 2021-07-10 DIAGNOSIS — I10 Essential (primary) hypertension: Secondary | ICD-10-CM

## 2021-07-11 NOTE — Telephone Encounter (Signed)
Refilled 06/27/2021 #90.  Requested Prescriptions  Pending Prescriptions Disp Refills   lisinopril (ZESTRIL) 20 MG tablet [Pharmacy Med Name: LISINOPRIL 20 MG TABLET] 90 tablet 0    Sig: TAKE 1 TABLET BY MOUTH EVERY DAY     Cardiovascular:  ACE Inhibitors Failed - 07/10/2021  6:01 PM      Failed - Cr in normal range and within 180 days    Creatinine, Ser  Date Value Ref Range Status  10/04/2020 0.93 0.57 - 1.00 mg/dL Final         Failed - K in normal range and within 180 days    Potassium  Date Value Ref Range Status  10/04/2020 4.7 3.5 - 5.2 mmol/L Final         Failed - Valid encounter within last 6 months    Recent Outpatient Visits          6 months ago Diverticulitis of large intestine without perforation or abscess without bleeding   [facility] Glean [facility], MD   9 months ago Annual physical exam   [facility] Glean Hess, MD   12 months ago [facility] Glean Hess, MD   1 year ago Moderate persistent asthma with exacerbation   [facility] Glean Hess, MD   1 year ago Asthma in adult, mild intermittent, with acute exacerbation   [facility] Glean Hess, MD      Future Appointments            In 2 months Glean Hess, MD [facility] [place] - Patient is not pregnant      Passed - Last BP in normal range    BP Readings from Last 1 Encounters:  01/09/21 118/72

## 2021-08-19 ENCOUNTER — Other Ambulatory Visit: Payer: Self-pay | Admitting: Internal Medicine

## 2021-08-19 ENCOUNTER — Encounter: Payer: Self-pay | Admitting: Internal Medicine

## 2021-08-19 DIAGNOSIS — J4520 Mild intermittent asthma, uncomplicated: Secondary | ICD-10-CM

## 2021-08-19 MED ORDER — ALBUTEROL SULFATE HFA 108 (90 BASE) MCG/ACT IN AERS: INHALATION_SPRAY | RESPIRATORY_TRACT | 1 refills | Status: AC

## 2021-08-21 NOTE — Telephone Encounter (Signed)
rx was approved by provider on 08/19/21 @ 1651 ?Requested Prescriptions  ?Pending Prescriptions Disp Refills  ?? albuterol (VENTOLIN HFA) 108 (90 Base) MCG/ACT inhaler [Pharmacy Med Name: ALBUTEROL HFA (PROVENTIL) INH] 6.7 each 1  ?  Sig: TAKE 2 PUFFS BY MOUTH EVERY 6 HOURS AS NEEDED FOR WHEEZE OR SHORTNESS OF BREATH  ?  ? Pulmonology:  Beta Agonists 2 Passed - 08/19/2021 11:34 AM  ?  ?  Passed - Last BP in normal range  ?  BP Readings from Last 1 Encounters:  ?01/09/21 118/72  ?   ?  ?  Passed - Last Heart Rate in normal range  ?  Pulse Readings from Last 1 Encounters:  ?01/09/21 73  ?   ?  ?  Passed - Valid encounter within last 12 months  ?  Recent Outpatient Visits   ?      ? 7 months ago Diverticulitis of large intestine without perforation or abscess without bleeding  ? [facility] Reubin [facility], MD  ? 10 months ago Annual physical exam  ? [facility] Reubin Milan, MD  ? 1 year ago COVID-19  ? [facility] Reubin Milan, MD  ? 1 year ago Moderate persistent asthma with exacerbation  ? [facility] Reubin Milan, MD  ? 1 year ago Asthma in adult, mild intermittent, with acute exacerbation  ? [facility] Reubin Milan, MD  ?  ?  ?Future Appointments   ?        ? Tomorrow Reubin Milan, MD [facility], [facility]  ? In 1 month Judithann Graves Nyoka Cowden, MD [facility], [facility]  ?  ? ?  ?  ?  ? ? ?

## 2021-08-22 ENCOUNTER — Encounter: Payer: Self-pay | Admitting: Internal Medicine

## 2021-08-22 ENCOUNTER — Ambulatory Visit: Payer: Medicare PPO | Admitting: Internal Medicine

## 2021-08-22 VITALS — BP 138/66 | HR 86 | Temp 97.8°F | Ht 64.0 in | Wt 163.0 lb

## 2021-08-22 DIAGNOSIS — J45901 Unspecified asthma with (acute) exacerbation: Secondary | ICD-10-CM

## 2021-08-22 DIAGNOSIS — J4541 Moderate persistent asthma with (acute) exacerbation: Secondary | ICD-10-CM

## 2021-08-22 MED ORDER — PREDNISONE 10 MG PO TABS: 10.0000 mg | ORAL_TABLET | ORAL | 0 refills | Status: AC

## 2021-08-22 MED ORDER — PROMETHAZINE-DM 6.25-15 MG/5ML PO SYRP: 5.0000 mL | ORAL_SOLUTION | Freq: Four times a day (QID) | ORAL | 0 refills | Status: DC | PRN

## 2021-08-22 MED ORDER — FLUTICASONE-SALMETEROL 115-21 MCG/ACT IN AERO: 2.0000 | INHALATION_SPRAY | Freq: Two times a day (BID) | RESPIRATORY_TRACT | 12 refills | Status: DC

## 2021-08-22 MED ORDER — AZITHROMYCIN 250 MG PO TABS: ORAL_TABLET | ORAL | 0 refills | Status: AC

## 2021-08-22 NOTE — Progress Notes (Signed)
? ? ?Date:  08/22/2021  ? ?Name:  Sarah Hernandez   DOB:  03-23-1951   MRN:  [id number] ? ? ?Chief Complaint: Cough (Chest congestion ) ? ?Cough ?This is a new problem. Episode onset: 1 week. The problem has been gradually improving. The problem occurs constantly. The cough is Productive of sputum (yellow mucous). Associated symptoms include ear pain, nasal congestion, postnasal drip, rhinorrhea, a sore throat and wheezing. Pertinent negatives include no chest pain, chills, fever or headaches. Associated symptoms comments: Watery eyes. She has tried OTC cough suppressant and steroid inhaler for the symptoms. The treatment provided mild relief.  ? ?Lab Results  ?Component Value Date  ? NA 140 10/04/2020  ? K 4.7 10/04/2020  ? CO2 21 10/04/2020  ? GLUCOSE 90 10/04/2020  ? BUN 14 10/04/2020  ? CREATININE 0.93 10/04/2020  ? CALCIUM 9.1 10/04/2020  ? EGFR 65 10/04/2020  ? GFRNONAA 60 09/13/2019  ? ?Lab Results  ?Component Value Date  ? CHOL 189 10/04/2020  ? HDL 49 10/04/2020  ? LDLCALC 114 (H) 10/04/2020  ? TRIG 148 10/04/2020  ? CHOLHDL 3.9 10/04/2020  ? ?Lab Results  ?Component Value Date  ? TSH 5.270 (H) 10/04/2020  ? ?No results found for: HGBA1C ?Lab Results  ?Component Value Date  ? WBC 8.0 01/09/2021  ? HGB 12.0 01/09/2021  ? HCT 34.6 01/09/2021  ? MCV 86 01/09/2021  ? PLT 300 01/09/2021  ? ?Lab Results  ?Component Value Date  ? ALT 23 10/04/2020  ? AST 14 10/04/2020  ? GGT 11 02/25/2019  ? ALKPHOS 97 10/04/2020  ? BILITOT 0.3 10/04/2020  ? ?No results found for: 25OHVITD2, [place], VD25OH  ? ?Review of Systems  ?Constitutional:  Negative for chills, diaphoresis, fatigue and fever.  ?HENT:  Positive for ear pain, postnasal drip, rhinorrhea and sore throat.   ?Eyes:  Negative for visual disturbance.  ?Respiratory:  Positive for cough and wheezing.   ?Cardiovascular:  Negative for chest pain, palpitations and leg swelling.  ?Neurological:  Negative for dizziness and headaches.  ? ?Patient Active Problem List  ?  Diagnosis Date Noted  ? History of basal cell cancer 09/13/2019  ? Generalized anxiety disorder 09/09/2018  ? OAB (overactive bladder) 08/21/2016  ? Esophagitis, reflux 10/05/2014  ? Hemorrhoids, internal 10/05/2014  ? Hyperlipidemia, mixed 10/05/2014  ? Benign hypertension 10/05/2014  ? Irritable bowel syndrome with constipation 10/05/2014  ? Mild intermittent asthma without complication 79/06/4095  ? Allergic rhinitis, seasonal 10/05/2014  ? DD (diverticular disease) 12/13/2013  ? ? ?Allergies  ?Allergen Reactions  ? Amlodipine Swelling  ? Flagyl [Metronidazole] Nausea Only  ? ? ?Past Surgical History:  ?Procedure Laterality Date  ? ABDOMINAL HYSTERECTOMY  1997  ? APPENDECTOMY  1997  ? COLONOSCOPY  2015  ? diverticulosis  ? PARTIAL HYSTERECTOMY  1979  ? bleeding from fibroids  ? TONSILLECTOMY  1966  ? UPPER GASTROINTESTINAL ENDOSCOPY  09/2014  ? gastritis/esoph; no Barrett's  ? ? ?Social History  ? ?Tobacco Use  ? Smoking status: Never  ? Smokeless tobacco: Never  ?Vaping Use  ? Vaping Use: Never used  ?Substance Use Topics  ? Alcohol use: Yes  ? Drug use: Not Currently  ? ? ? ?Medication list has been reviewed and updated. ? ?Current Meds  ?Medication Sig  ? albuterol (PROVENTIL) (2.5 MG/3ML) 0.083% nebulizer solution Inhale 3 mLs into the lungs 4 (four) times daily as needed. Reported on 08/16/2015  ? albuterol (VENTOLIN HFA) 108 (90 Base) MCG/ACT inhaler TAKE  2 PUFFS BY MOUTH EVERY 6 HOURS AS NEEDED FOR WHEEZE OR SHORTNESS OF BREATH  ? atorvastatin (LIPITOR) 40 MG tablet TAKE 1 TABLET BY MOUTH EVERYDAY AT BEDTIME  ? azithromycin (ZITHROMAX Z-PAK) 250 MG tablet UAD  ? Cyanocobalamin (VITAMIN B-12 PO) Place 1 drop under the tongue daily.  ? hydrocortisone (ANUSOL-HC) 2.5 % rectal cream Place 1 application rectally 2 (two) times daily.  ? hyoscyamine (LEVSIN) 0.125 MG tablet Take 0.125 mg by mouth every 4 (four) hours as needed.  ? lisinopril (ZESTRIL) 20 MG tablet TAKE 1 TABLET BY MOUTH EVERY DAY  ? metoprolol  succinate (TOPROL-XL) 25 MG 24 hr tablet TAKE 1 TABLET (25 MG TOTAL) BY MOUTH DAILY.  ? nitroGLYCERIN (NITROSTAT) 0.4 MG SL tablet Place 1 tablet (0.4 mg total) under the tongue every 5 (five) minutes as needed for chest pain.  ? omeprazole (PRILOSEC) 20 MG capsule TAKE 1 CAPSULE BY MOUTH EVERY DAY  ? predniSONE (DELTASONE) 10 MG tablet Take 1 tablet (10 mg total) by mouth as directed for 6 days. Take 6,5,4,3,2,1 then stop  ? promethazine (PHENERGAN) 25 MG tablet Take 1 tablet (25 mg total) by mouth every 8 (eight) hours as needed for nausea or vomiting (Vertigo).  ? promethazine-dextromethorphan (PROMETHAZINE-DM) 6.25-15 MG/5ML syrup Take 5 mLs by mouth 4 (four) times daily as needed for cough.  ? sertraline (ZOLOFT) 50 MG tablet TAKE 1 TABLET BY MOUTH EVERY DAY  ? VITAMIN D PO Take 2,000 Units by mouth daily.  ? [DISCONTINUED] fluticasone-salmeterol (ADVAIR HFA) 115-21 MCG/ACT inhaler Inhale 2 puffs into the lungs 2 (two) times daily.  ? ? ? ?  08/22/2021  ?  2:44 PM 01/09/2021  ? 10:32 AM 06/26/2020  ? 11:26 AM 06/08/2020  ? 10:10 AM  ?GAD 7 : Generalized Anxiety Score  ?Nervous, Anxious, on Edge 0 1 0 0  ?Control/stop worrying 0 1 0 0  ?Worry too much - different things 0 1 0 0  ?Trouble relaxing 0 1 0 0  ?Restless 0 0 0 0  ?Easily annoyed or irritable 0 1 0 0  ?Afraid - awful might happen 0 0 0 0  ?Total GAD 7 Score 0 5 0 0  ?Anxiety Difficulty Not difficult at all Somewhat difficult    ? ? ? ?  08/22/2021  ?  2:44 PM  ?Depression screen PHQ 2/9  ?Decreased Interest 0  ?Down, Depressed, Hopeless 0  ?PHQ - 2 Score 0  ?Altered sleeping 0  ?Tired, decreased energy 0  ?Change in appetite 0  ?Feeling bad or failure about yourself  0  ?Trouble concentrating 0  ?Moving slowly or fidgety/restless 0  ?Suicidal thoughts 0  ?PHQ-9 Score 0  ?Difficult doing work/chores Not difficult at all  ? ? ?BP Readings from Last 3 Encounters:  ?08/22/21 138/66  ?01/09/21 118/72  ?10/02/20 (!) 144/84  ? ? ?Physical Exam ?Constitutional:   ?    Appearance: Normal appearance.  ?Cardiovascular:  ?   Rate and Rhythm: Normal rate and regular rhythm.  ?Pulmonary:  ?   Effort: Pulmonary effort is normal.  ?   Breath sounds: Transmitted upper airway sounds present. Examination of the right-upper field reveals wheezing. Examination of the left-upper field reveals wheezing. Wheezing present. No rhonchi.  ?Musculoskeletal:  ?   Cervical back: Normal range of motion.  ?Lymphadenopathy:  ?   Cervical: No cervical adenopathy.  ?Neurological:  ?   Mental Status: She is alert.  ? ? ?Wt Readings from Last 3 Encounters:  ?08/22/21  163 lb (73.9 kg)  ?01/09/21 160 lb (72.6 kg)  ?10/02/20 157 lb (71.2 kg)  ? ? ?BP 138/66   Pulse 86   Temp 97.8 ?F (36.6 ?C) (Oral)   Ht $R'5\' 4"'Gm$  (1.626 m)   Wt 163 lb (73.9 kg)   SpO2 97%   BMI 27.98 kg/m?  ? ?Assessment and Plan: ?1. Asthma exacerbation, mild ?Continue nebulizer as needed and Advair bid ?- azithromycin (ZITHROMAX Z-PAK) 250 MG tablet; UAD  Dispense: 6 each; Refill: 0 ?- predniSONE (DELTASONE) 10 MG tablet; Take 1 tablet (10 mg total) by mouth as directed for 6 days. Take 6,5,4,3,2,1 then stop  Dispense: 21 tablet; Refill: 0 ?- promethazine-dextromethorphan (PROMETHAZINE-DM) 6.25-15 MG/5ML syrup; Take 5 mLs by mouth 4 (four) times daily as needed for cough.  Dispense: 180 mL; Refill: 0 ? ?2. Moderate persistent asthma with exacerbation ?- fluticasone-salmeterol (ADVAIR HFA) 115-21 MCG/ACT inhaler; Inhale 2 puffs into the lungs 2 (two) times daily.  Dispense: 1 each; Refill: 12 ? ? ?Partially dictated using Editor, commissioning. Any errors are unintentional. ? ?Halina Maidens, MD ?[facility] ?[facility] ? ?08/22/2021 ? ? ? ? ? ?

## 2021-09-12 ENCOUNTER — Ambulatory Visit (INDEPENDENT_AMBULATORY_CARE_PROVIDER_SITE_OTHER): Payer: Medicare PPO

## 2021-09-12 VITALS — BP 180/90 | HR 72 | Temp 98.1°F | Resp 16 | Ht 64.0 in | Wt 161.4 lb

## 2021-09-12 DIAGNOSIS — Z0000 Encounter for general adult medical examination without abnormal findings: Secondary | ICD-10-CM

## 2021-09-12 NOTE — Patient Instructions (Signed)
Ms. Leever , ?Thank you for taking time to come for your Medicare Wellness Visit. I appreciate your ongoing commitment to your health goals. Please review the following plan we discussed and let me know if I can assist you in the future.  ? ?Screening recommendations/referrals: ?Colonoscopy: done 10/19/13. Repeat 09/2023 ?Mammogram: done 11/06/20 ?Bone Density: done 11/01/19 ?Recommended yearly ophthalmology/optometry visit for glaucoma screening and checkup ?Recommended yearly dental visit for hygiene and checkup ? ?Vaccinations: ?Influenza vaccine: done 02/11/21 ?Pneumococcal vaccine: done 08/10/14 ?Tdap vaccine: done 10/28/18 ?Shingles vaccine: done 03/07/19 & 05/07/19   ?Covid-19:done 07/17/19, 08/08/19, 03/08/20 09/20/20 & 02/11/21 ? ?Advanced directives: Please bring a copy of your health care power of attorney and living will to the office at your convenience.  ? ?Conditions/risks identified: Keep up the great work! ? ?Next appointment: Follow up in one year for your annual wellness visit  ? ? ?Preventive Care 30 Years and Older, Female ?Preventive care refers to lifestyle choices and visits with your health care provider that can promote health and wellness. ?What does preventive care include? ?A yearly physical exam. This is also called an annual well check. ?Dental exams once or twice a year. ?Routine eye exams. Ask your health care provider how often you should have your eyes checked. ?Personal lifestyle choices, including: ?Daily care of your teeth and gums. ?Regular physical activity. ?Eating a healthy diet. ?Avoiding tobacco and drug use. ?Limiting alcohol use. ?Practicing safe sex. ?Taking low-dose aspirin every day. ?Taking vitamin and mineral supplements as recommended by your health care provider. ?What happens during an annual well check? ?The services and screenings done by your health care provider during your annual well check will depend on your age, overall health, lifestyle risk factors, and family  history of disease. ?Counseling  ?Your health care provider may ask you questions about your: ?Alcohol use. ?Tobacco use. ?Drug use. ?Emotional well-being. ?Home and relationship well-being. ?Sexual activity. ?Eating habits. ?History of falls. ?Memory and ability to understand (cognition). ?Work and work Astronomer. ?Reproductive health. ?Screening  ?You may have the following tests or measurements: ?Height, weight, and BMI. ?Blood pressure. ?Lipid and cholesterol levels. These may be checked every 5 years, or more frequently if you are over 53 years old. ?Skin check. ?Lung cancer screening. You may have this screening every year starting at age 57 if you have a 30-pack-year history of smoking and currently smoke or have quit within the past 15 years. ?Fecal occult blood test (FOBT) of the stool. You may have this test every year starting at age 70. ?Flexible sigmoidoscopy or colonoscopy. You may have a sigmoidoscopy every 5 years or a colonoscopy every 10 years starting at age 74. ?Hepatitis C blood test. ?Hepatitis B blood test. ?Sexually transmitted disease (STD) testing. ?Diabetes screening. This is done by checking your blood sugar (glucose) after you have not eaten for a while (fasting). You may have this done every 1-3 years. ?Bone density scan. This is done to screen for osteoporosis. You may have this done starting at age 30. ?Mammogram. This may be done every 1-2 years. Talk to your health care provider about how often you should have regular mammograms. ?Talk with your health care provider about your test results, treatment options, and if necessary, the need for more tests. ?Vaccines  ?Your health care provider may recommend certain vaccines, such as: ?Influenza vaccine. This is recommended every year. ?Tetanus, diphtheria, and acellular pertussis (Tdap, Td) vaccine. You may need a Td booster every 10 years. ?Zoster vaccine. You  may need this after age 55. ?Pneumococcal 13-valent conjugate (PCV13)  vaccine. One dose is recommended after age 60. ?Pneumococcal polysaccharide (PPSV23) vaccine. One dose is recommended after age 30. ?Talk to your health care provider about which screenings and vaccines you need and how often you need them. ?This information is not intended to replace advice given to you by your health care provider. Make sure you discuss any questions you have with your health care provider. ?Document Released: 06/09/2015 Document Revised: 01/31/2016 Document Reviewed: 03/14/2015 ?Elsevier Interactive Patient Education ? 2017 [place]. ? ?Fall Prevention in the Home ?Falls can cause injuries. They can happen to people of all ages. There are many things you can do to make your home safe and to help prevent falls. ?What can I do on the outside of my home? ?Regularly fix the edges of walkways and driveways and fix any cracks. ?Remove anything that might make you trip as you walk through a door, such as a raised step or threshold. ?Trim any bushes or trees on the path to your home. ?Use bright outdoor lighting. ?Clear any walking paths of anything that might make someone trip, such as rocks or tools. ?Regularly check to see if handrails are loose or broken. Make sure that both sides of any steps have handrails. ?Any raised decks and porches should have guardrails on the edges. ?Have any leaves, snow, or ice cleared regularly. ?Use sand or salt on walking paths during winter. ?Clean up any spills in your garage right away. This includes oil or grease spills. ?What can I do in the bathroom? ?Use night lights. ?Install grab bars by the toilet and in the tub and shower. Do not use towel bars as grab bars. ?Use non-skid mats or decals in the tub or shower. ?If you need to sit down in the shower, use a plastic, non-slip stool. ?Keep the floor dry. Clean up any water that spills on the floor as soon as it happens. ?Remove soap buildup in the tub or shower regularly. ?Attach bath mats securely with  double-sided non-slip rug tape. ?Do not have throw rugs and other things on the floor that can make you trip. ?What can I do in the bedroom? ?Use night lights. ?Make sure that you have a light by your bed that is easy to reach. ?Do not use any sheets or blankets that are too big for your bed. They should not hang down onto the floor. ?Have a firm chair that has side arms. You can use this for support while you get dressed. ?Do not have throw rugs and other things on the floor that can make you trip. ?What can I do in the kitchen? ?Clean up any spills right away. ?Avoid walking on wet floors. ?Keep items that you use a lot in easy-to-reach places. ?If you need to reach something above you, use a strong step stool that has a grab bar. ?Keep electrical cords out of the way. ?Do not use floor polish or wax that makes floors slippery. If you must use wax, use non-skid floor wax. ?Do not have throw rugs and other things on the floor that can make you trip. ?What can I do with my stairs? ?Do not leave any items on the stairs. ?Make sure that there are handrails on both sides of the stairs and use them. Fix handrails that are broken or loose. Make sure that handrails are as long as the stairways. ?Check any carpeting to make sure that it is firmly  attached to the stairs. Fix any carpet that is loose or worn. ?Avoid having throw rugs at the top or bottom of the stairs. If you do have throw rugs, attach them to the floor with carpet tape. ?Make sure that you have a light switch at the top of the stairs and the bottom of the stairs. If you do not have them, ask someone to add them for you. ?What else can I do to help prevent falls? ?Wear shoes that: ?Do not have high heels. ?Have rubber bottoms. ?Are comfortable and fit you well. ?Are closed at the toe. Do not wear sandals. ?If you use a stepladder: ?Make sure that it is fully opened. Do not climb a closed stepladder. ?Make sure that both sides of the stepladder are locked  into place. ?Ask someone to hold it for you, if possible. ?Clearly mark and make sure that you can see: ?Any grab bars or handrails. ?First and last steps. ?Where the edge of each step is. ?Use tools that help you move

## 2021-09-12 NOTE — Progress Notes (Signed)
? ?Subjective:  ? Sarah Hernandez is a 71 y.o. female who presents for Medicare Annual (Subsequent) preventive examination. ? ?Review of Systems    ? ?Cardiac Risk Factors include: advanced age (>54men, >8 women);dyslipidemia;hypertension ? ?   ?Objective:  ?  ?Today's Vitals  ? 09/12/21 0827 09/12/21 0851  ?BP: (!) 172/92 (!) 180/90  ?Pulse: 72   ?Resp: 16   ?Temp: 98.1 ?F (36.7 ?C)   ?TempSrc: Oral   ?SpO2: 98%   ?Weight: 161 lb 6.4 oz (73.2 kg)   ?Height: 5\' 4"  (1.626 m)   ? ?Body mass index is 27.7 kg/m?. ? ? ?  09/12/2021  ?  8:31 AM 09/11/2020  ?  8:35 AM 09/08/2019  ?  8:22 AM 07/09/2019  ?  1:12 PM 09/07/2018  ?  9:37 AM 05/05/2018  ?  1:12 PM 08/27/2017  ?  8:23 AM  ?Advanced Directives  ?Does Patient Have a Medical Advance Directive? Yes Yes Yes Yes Yes Yes Yes  ?Type of Paramedic of [place];Living will [place];Living will [place];Living will [place];Living will [place];Living will [place];Living will;Out of facility DNR (pink MOST or yellow form) [place];Living will  ?Copy of [place] in Chart? No - copy requested No - copy requested No - copy requested  No - copy requested No - copy requested No - copy requested  ? ? ?Current Medications (verified) ?Outpatient Encounter Medications as of 09/12/2021  ?Medication Sig  ? albuterol (PROVENTIL) (2.5 MG/3ML) 0.083% nebulizer solution Inhale 3 mLs into the lungs 4 (four) times daily as needed. Reported on 08/16/2015  ? albuterol (VENTOLIN HFA) 108 (90 Base) MCG/ACT inhaler TAKE 2 PUFFS BY MOUTH EVERY 6 HOURS AS NEEDED FOR WHEEZE OR SHORTNESS OF BREATH  ? atorvastatin (LIPITOR) 40 MG tablet TAKE 1 TABLET BY MOUTH EVERYDAY AT BEDTIME  ? Cyanocobalamin (VITAMIN B-12 PO) Place 1 drop under the tongue daily.  ? hyoscyamine (LEVSIN) 0.125 MG tablet Take 0.125 mg by mouth every 4 (four) hours as needed.  ?  lisinopril (ZESTRIL) 20 MG tablet TAKE 1 TABLET BY MOUTH EVERY DAY  ? metoprolol succinate (TOPROL-XL) 25 MG 24 hr tablet TAKE 1 TABLET (25 MG TOTAL) BY MOUTH DAILY.  ? omeprazole (PRILOSEC) 20 MG capsule TAKE 1 CAPSULE BY MOUTH EVERY DAY  ? sertraline (ZOLOFT) 50 MG tablet TAKE 1 TABLET BY MOUTH EVERY DAY  ? VITAMIN D PO Take 2,000 Units by mouth daily.  ? fluticasone-salmeterol (ADVAIR HFA) 115-21 MCG/ACT inhaler Inhale 2 puffs into the lungs 2 (two) times daily.  ? hydrocortisone (ANUSOL-HC) 2.5 % rectal cream Place 1 application rectally 2 (two) times daily.  ? nitroGLYCERIN (NITROSTAT) 0.4 MG SL tablet Place 1 tablet (0.4 mg total) under the tongue every 5 (five) minutes as needed for chest pain.  ? [DISCONTINUED] albuterol (VENTOLIN HFA) 108 (90 Base) MCG/ACT inhaler TAKE 2 PUFFS BY MOUTH EVERY 6 HOURS AS NEEDED FOR WHEEZE OR SHORTNESS OF BREATH  ? [DISCONTINUED] atorvastatin (LIPITOR) 40 MG tablet TAKE 1 TABLET BY MOUTH EVERYDAY AT BEDTIME  ? [DISCONTINUED] fluticasone-salmeterol (ADVAIR HFA) 115-21 MCG/ACT inhaler Inhale 2 puffs into the lungs 2 (two) times daily.  ? [DISCONTINUED] hydrocortisone (ANUSOL-HC) 2.5 % rectal cream Place 1 application rectally 2 (two) times daily.  ? [DISCONTINUED] hydrocortisone (ANUSOL-HC) 25 MG suppository Place 1 suppository (25 mg total) rectally 2 (two) times daily.  ? [DISCONTINUED] lisinopril (ZESTRIL) 20  MG tablet TAKE 1 TABLET BY MOUTH EVERY DAY  ? [DISCONTINUED] metoprolol succinate (TOPROL-XL) 25 MG 24 hr tablet Take 1 tablet (25 mg total) by mouth daily.  ? [DISCONTINUED] omeprazole (PRILOSEC) 20 MG capsule Take 1 capsule (20 mg total) by mouth daily.  ? [DISCONTINUED] promethazine (PHENERGAN) 25 MG tablet Take 1 tablet (25 mg total) by mouth every 8 (eight) hours as needed for nausea or vomiting (Vertigo).  ? [DISCONTINUED] promethazine-dextromethorphan (PROMETHAZINE-DM) 6.25-15 MG/5ML syrup Take 5 mLs by mouth 4 (four) times daily as needed for cough.  ?  [DISCONTINUED] sertraline (ZOLOFT) 50 MG tablet TAKE 1 TABLET BY MOUTH EVERY DAY  ? ?No facility-administered encounter medications on file as of 09/12/2021.  ? ? ?Allergies (verified) ?Amlodipine and Flagyl [metronidazole]  ? ?History: ?Past Medical History:  ?Diagnosis Date  ? Allergy Seasonal  ? Anxiety Meds  ? Asthma Meds as needed  ? Diverticulosis   ? GERD (gastroesophageal reflux disease)   ? History of basal cell cancer 09/13/2019  ? Hyperlipidemia   ? Hypertension   ? Irritable bowel   ? Overactive bladder   ? ?Past Surgical History:  ?Procedure Laterality Date  ? ABDOMINAL HYSTERECTOMY  1997  ? APPENDECTOMY  1997  ? COLONOSCOPY  2015  ? diverticulosis  ? PARTIAL HYSTERECTOMY  1979  ? bleeding from fibroids  ? TONSILLECTOMY  1966  ? UPPER GASTROINTESTINAL ENDOSCOPY  09/2014  ? gastritis/esoph; no Barrett's  ? ?Family History  ?Problem Relation Age of Onset  ? Dementia Mother   ? Varicose Veins Mother   ? CAD Father   ? Heart disease Father   ? CAD Brother   ? Stroke Brother   ? Heart disease Son   ? ?Social History  ? ?Socioeconomic History  ? Marital status: Married  ?  Spouse name: Chevis Pretty  ? Number of children: 2  ? Years of education: 12+  ? Highest education level: Some college, no degree  ?Occupational History  ? Occupation: part time  ?  Comment: [employer]  ?Tobacco Use  ? Smoking status: Never  ? Smokeless tobacco: Never  ?Vaping Use  ? Vaping Use: Never used  ?Substance and Sexual Activity  ? Alcohol use: Yes  ?  Alcohol/week: 5.0 standard drinks  ?  Types: 5 Shots of liquor per week  ?  Comment: Occasionally  ? Drug use: Not Currently  ? Sexual activity: Yes  ?  Partners: Male  ?Other Topics Concern  ? Not on file  ?Social History Narrative  ? Not on file  ? ?Social Determinants of Health  ? ?Financial Resource Strain: Low Risk   ? Difficulty of Paying Living Expenses: Not hard at all  ?Food Insecurity: No Food Insecurity  ? Worried About Charity fundraiser in the Last Year:  Never true  ? Ran Out of Food in the Last Year: Never true  ?Transportation Needs: Not on file  ?Physical Activity: Sufficiently Active  ? Days of Exercise per Week: 6 days  ? Minutes of Exercise per Session: 40 min  ?Stress: No Stress Concern Present  ? Feeling of Stress : Not at all  ?Social Connections: Moderately Integrated  ? Frequency of Communication with Friends and Family: More than three times a week  ? Frequency of Social Gatherings with Friends and Family: Three times a week  ? Attends Religious Services: More than 4 times per year  ? Active Member of Clubs or Organizations: No  ? Attends Archivist  Meetings: Never  ? Marital Status: Married  ? ? ?Tobacco Counseling ?Counseling given: Not Answered ? ? ?Clinical Intake: ? ?Pre-visit preparation completed: Yes ? ?Pain : No/denies pain ? ?  ? ?BMI - recorded: 27.7 ?Nutritional Status: BMI 25 -29 Overweight ?Nutritional Risks: None ?Diabetes: No ? ?How often do you need to have someone help you when you read instructions, pamphlets, or other written materials from your doctor or pharmacy?: 1 - Never ? ? ? ?Interpreter Needed?: No ? ?Information entered by :: Clemetine Marker LPN ? ? ?Activities of Daily Living ? ?  09/12/2021  ?  8:33 AM 08/22/2021  ?  2:44 PM  ?In your present state of health, do you have any difficulty performing the following activities:  ?Hearing? 1 0  ?Vision? 0 0  ?Difficulty concentrating or making decisions? 0 0  ?Walking or climbing stairs? 0 0  ?Dressing or bathing? 0 0  ?Doing errands, shopping? 0 0  ?Preparing Food and eating ? N   ?Using the Toilet? N   ?In the past six months, have you accidently leaked urine? Y   ?Comment wears liners for protection   ?Do you have problems with loss of bowel control? N   ?Managing your Medications? N   ?Managing your Finances? N   ?Housekeeping or managing your Housekeeping? N   ? ? ?Patient Care Team: ?Glean Hess, MD as PCP - General (Internal Medicine) ?Yolonda Kida, MD as  Consulting Physician (Cardiology) ? ?Indicate any recent Medical Services you may have received from other than [facility] providers in the past year (date may be approximate). ? ?   ?Assessment:  ? This is a routine wel

## 2021-09-16 ENCOUNTER — Other Ambulatory Visit: Payer: Self-pay | Admitting: Internal Medicine

## 2021-09-16 DIAGNOSIS — I10 Essential (primary) hypertension: Secondary | ICD-10-CM

## 2021-09-18 NOTE — Telephone Encounter (Signed)
Requested Prescriptions  ?Pending Prescriptions Disp Refills  ?? lisinopril (ZESTRIL) 20 MG tablet [Pharmacy Med Name: LISINOPRIL 20 MG TABLET] 90 tablet 0  ?  Sig: TAKE 1 TABLET BY MOUTH EVERY DAY  ?  ? Cardiovascular:  ACE Inhibitors Failed - 09/16/2021  3:26 PM  ?  ?  Failed - Cr in normal range and within 180 days  ?  Creatinine, Ser  ?Date Value Ref Range Status  ?10/04/2020 0.93 0.57 - 1.00 mg/dL Final  ?   ?  ?  Failed - K in normal range and within 180 days  ?  Potassium  ?Date Value Ref Range Status  ?10/04/2020 4.7 3.5 - 5.2 mmol/L Final  ?   ?  ?  Failed - Last BP in normal range  ?  BP Readings from Last 1 Encounters:  ?09/12/21 (!) 180/90  ?   ?  ?  Passed - Patient is not pregnant  ?  ?  Passed - Valid encounter within last 6 months  ?  Recent Outpatient Visits   ?      ? 3 weeks ago Asthma exacerbation, mild  ? [facility] Reubin Milan, MD  ? 8 months ago Diverticulitis of large intestine without perforation or abscess without bleeding  ? [facility] Reubin Milan, MD  ? 11 months ago Annual physical exam  ? [facility] Reubin Milan, MD  ? 1 year ago COVID-19  ? [facility] Reubin Milan, MD  ? 1 year ago Moderate persistent asthma with exacerbation  ? [facility] Reubin Milan, MD  ?  ?  ?Future Appointments   ?        ? In 2 weeks Judithann Graves Nyoka Cowden, MD [facility]  ?  ? ?  ?  ?  ? ? ?

## 2021-09-20 ENCOUNTER — Other Ambulatory Visit: Payer: Self-pay | Admitting: Internal Medicine

## 2021-09-20 DIAGNOSIS — E782 Mixed hyperlipidemia: Secondary | ICD-10-CM

## 2021-09-20 NOTE — Telephone Encounter (Signed)
Requested Prescriptions  ?Pending Prescriptions Disp Refills  ?? atorvastatin (LIPITOR) 40 MG tablet [Pharmacy Med Name: ATORVASTATIN 40 MG TABLET] 90 tablet 0  ?  Sig: TAKE 1 TABLET BY MOUTH EVERYDAY AT BEDTIME  ?  ? Cardiovascular:  Antilipid - Statins Failed - 09/20/2021  3:05 AM  ?  ?  Failed - Lipid Panel in normal range within the last 12 months  ?  Cholesterol, Total  ?Date Value Ref Range Status  ?10/04/2020 189 100 - 199 mg/dL Final  ? ?LDL Chol Calc (NIH)  ?Date Value Ref Range Status  ?10/04/2020 114 (H) 0 - 99 mg/dL Final  ? ?HDL  ?Date Value Ref Range Status  ?10/04/2020 49 >39 mg/dL Final  ? ?Triglycerides  ?Date Value Ref Range Status  ?10/04/2020 148 0 - 149 mg/dL Final  ? ?  ?  ?  Passed - Patient is not pregnant  ?  ?  Passed - Valid encounter within last 12 months  ?  Recent Outpatient Visits   ?      ? 4 weeks ago Asthma exacerbation, mild  ? [facility] Glean Hess, MD  ? 8 months ago Diverticulitis of large intestine without perforation or abscess without bleeding  ? [facility] Glean Hess, MD  ? 11 months ago Annual physical exam  ? [facility] Glean Hess, MD  ? 1 year ago COVID-19  ? [facility] Glean Hess, MD  ? 1 year ago Moderate persistent asthma with exacerbation  ? [facility] Glean Hess, MD  ?  ?  ?Future Appointments   ?        ? In 2 weeks Army Melia Jesse Sans, MD [facility], [place]  ?  ? ?  ?  ?  ? ? ?

## 2021-10-07 ENCOUNTER — Encounter: Payer: Self-pay | Admitting: Internal Medicine

## 2021-10-08 ENCOUNTER — Other Ambulatory Visit: Payer: Self-pay | Admitting: Internal Medicine

## 2021-10-08 ENCOUNTER — Ambulatory Visit (INDEPENDENT_AMBULATORY_CARE_PROVIDER_SITE_OTHER): Payer: Medicare PPO | Admitting: Internal Medicine

## 2021-10-08 ENCOUNTER — Encounter: Payer: Self-pay | Admitting: Internal Medicine

## 2021-10-08 VITALS — BP 138/82 | HR 70 | Ht 64.0 in | Wt 159.0 lb

## 2021-10-08 DIAGNOSIS — E782 Mixed hyperlipidemia: Secondary | ICD-10-CM | POA: Diagnosis not present

## 2021-10-08 DIAGNOSIS — W57XXXA Bitten or stung by nonvenomous insect and other nonvenomous arthropods, initial encounter: Secondary | ICD-10-CM | POA: Diagnosis not present

## 2021-10-08 DIAGNOSIS — Z1231 Encounter for screening mammogram for malignant neoplasm of breast: Secondary | ICD-10-CM | POA: Diagnosis not present

## 2021-10-08 DIAGNOSIS — K2100 Gastro-esophageal reflux disease with esophagitis, without bleeding: Secondary | ICD-10-CM

## 2021-10-08 DIAGNOSIS — Z0000 Encounter for general adult medical examination without abnormal findings: Secondary | ICD-10-CM | POA: Diagnosis not present

## 2021-10-08 DIAGNOSIS — I10 Essential (primary) hypertension: Secondary | ICD-10-CM | POA: Diagnosis not present

## 2021-10-08 DIAGNOSIS — F411 Generalized anxiety disorder: Secondary | ICD-10-CM

## 2021-10-08 DIAGNOSIS — S20461A Insect bite (nonvenomous) of right back wall of thorax, initial encounter: Secondary | ICD-10-CM

## 2021-10-08 DIAGNOSIS — Z13820 Encounter for screening for osteoporosis: Secondary | ICD-10-CM | POA: Diagnosis not present

## 2021-10-08 LAB — POCT URINALYSIS DIPSTICK
Bilirubin, UA: NEGATIVE
Blood, UA: NEGATIVE
Glucose, UA: NEGATIVE
Ketones, UA: NEGATIVE
Leukocytes, UA: NEGATIVE
Nitrite, UA: NEGATIVE
Protein, UA: NEGATIVE
Spec Grav, UA: 1.015 (ref 1.010–1.025)
Urobilinogen, UA: 0.2 E.U./dL
pH, UA: 6 (ref 5.0–8.0)

## 2021-10-08 MED ORDER — LISINOPRIL 40 MG PO TABS: 40.0000 mg | ORAL_TABLET | Freq: Every day | ORAL | 3 refills | Status: DC

## 2021-10-08 MED ORDER — METOPROLOL SUCCINATE ER 25 MG PO TB24: 25.0000 mg | ORAL_TABLET | Freq: Every day | ORAL | 3 refills | Status: DC

## 2021-10-08 MED ORDER — SERTRALINE HCL 50 MG PO TABS: 50.0000 mg | ORAL_TABLET | Freq: Every day | ORAL | 3 refills | Status: DC

## 2021-10-08 MED ORDER — OMEPRAZOLE 20 MG PO CPDR: DELAYED_RELEASE_CAPSULE | ORAL | 3 refills | Status: DC

## 2021-10-08 NOTE — Progress Notes (Signed)
? ? ?Date:  10/08/2021  ? ?Name:  Sarah Hernandez   DOB:  1951-05-09   MRN:  [id number] ? ? ?Chief Complaint: Annual Exam (Breast exam no pap ) ?Sarah Hernandez is a 71 y.o. female who presents today for her Complete Annual Exam. She feels well. She reports exercising walking and yard work. She reports she is sleeping well. Breast complaints none. ? ?Mammogram: 10/2020  [facility] scheduled ?DEXA: 10/2019 normal spine, osteopenia hip ?Pap smear: discontinued ?Colonoscopy: 09/2013 repeat 10 yrs ? ?There are no preventive care reminders to display for this patient.  ?Immunization History  ?Administered Date(s) Administered  ? Fluad Quad(high Dose 65+) 01/20/2019, 02/16/2020  ? Influenza, High Dose Seasonal PF 02/02/2018, 02/11/2021  ? Influenza,inj,Quad PF,6+ Mos 03/08/2015, 02/14/2016, 01/28/2017  ? Influenza-Unspecified 03/08/2015, 02/14/2016, 01/28/2017  ? PFIZER Comirnaty(Gray Top)Covid-19 Tri-Sucrose Vaccine 03/08/2020, 09/20/2020  ? PFIZER(Purple Top)SARS-COV-2 Vaccination 07/17/2019, 08/08/2019  ? Pension scheme manager 10yrs & up 02/11/2021  ? Pneumococcal Conjugate-13 08/10/2014  ? Pneumococcal Polysaccharide-23 06/15/2013  ? Tdap 04/15/2007, 05/28/2008, 10/28/2018  ? Zoster Recombinat (Shingrix) 03/07/2019, 05/07/2019  ? Zoster, Live 05/28/2012  ? ? ?Hypertension ?This is a chronic problem. The problem is controlled. Pertinent negatives include no chest pain, headaches, palpitations or shortness of breath. Past treatments include ACE inhibitors and beta blockers. The current treatment provides significant improvement. There are no compliance problems.  There is no history of kidney disease, CAD/MI or CVA.  ?Hyperlipidemia ?This is a chronic problem. The problem is controlled. Pertinent negatives include no chest pain or shortness of breath. Current antihyperlipidemic treatment includes statins. The current treatment provides significant improvement of lipids.  ?Gastroesophageal Reflux ?She  complains of heartburn. She reports no abdominal pain, no chest pain, no coughing, no dysphagia, no early satiety or no wheezing. This is a recurrent problem. The problem occurs rarely. Pertinent negatives include no fatigue. She has tried a PPI for the symptoms. The treatment provided significant relief. Past procedures include an EGD.  ?Asthma ?There is no cough, shortness of breath or wheezing. This is a recurrent problem. The problem occurs intermittently. Associated symptoms include heartburn. Pertinent negatives include no chest pain, fever, headaches or trouble swallowing. Her symptoms are alleviated by beta-agonist. Her past medical history is significant for asthma.  ? ?Lab Results  ?Component Value Date  ? NA 140 10/04/2020  ? K 4.7 10/04/2020  ? CO2 21 10/04/2020  ? GLUCOSE 90 10/04/2020  ? BUN 14 10/04/2020  ? CREATININE 0.93 10/04/2020  ? CALCIUM 9.1 10/04/2020  ? EGFR 65 10/04/2020  ? GFRNONAA 60 09/13/2019  ? ?Lab Results  ?Component Value Date  ? CHOL 189 10/04/2020  ? HDL 49 10/04/2020  ? LDLCALC 114 (H) 10/04/2020  ? TRIG 148 10/04/2020  ? CHOLHDL 3.9 10/04/2020  ? ?Lab Results  ?Component Value Date  ? TSH 5.270 (H) 10/04/2020  ? ?No results found for: HGBA1C ?Lab Results  ?Component Value Date  ? WBC 8.0 01/09/2021  ? HGB 12.0 01/09/2021  ? HCT 34.6 01/09/2021  ? MCV 86 01/09/2021  ? PLT 300 01/09/2021  ? ?Lab Results  ?Component Value Date  ? ALT 23 10/04/2020  ? AST 14 10/04/2020  ? GGT 11 02/25/2019  ? ALKPHOS 97 10/04/2020  ? BILITOT 0.3 10/04/2020  ? ?No results found for: 25OHVITD2, [place], VD25OH  ? ?Review of Systems  ?Constitutional:  Negative for chills, fatigue and fever.  ?HENT:  Negative for congestion, hearing loss, tinnitus, trouble swallowing and voice change.   ?  Eyes:  Negative for visual disturbance.  ?Respiratory:  Negative for cough, chest tightness, shortness of breath and wheezing.   ?Cardiovascular:  Negative for chest pain, palpitations and leg swelling.   ?Gastrointestinal:  Positive for heartburn. Negative for abdominal pain, constipation, diarrhea, dysphagia and vomiting.  ?Endocrine: Negative for polydipsia and polyuria.  ?Genitourinary:  Negative for dysuria, frequency, genital sores, vaginal bleeding and vaginal discharge.  ?Musculoskeletal:  Negative for arthralgias, gait problem and joint swelling.  ?Skin:  Negative for color change and rash.  ?     Several tick bites on back and legs  ?Neurological:  Negative for dizziness, tremors, light-headedness and headaches.  ?Hematological:  Negative for adenopathy. Does not bruise/bleed easily.  ?Psychiatric/Behavioral:  Negative for dysphoric mood and sleep disturbance. The patient is not nervous/anxious.   ? ?Patient Active Problem List  ? Diagnosis Date Noted  ? History of basal cell cancer 09/13/2019  ? Generalized anxiety disorder 09/09/2018  ? OAB (overactive bladder) 08/21/2016  ? Esophagitis, reflux 10/05/2014  ? Hemorrhoids, internal 10/05/2014  ? Hyperlipidemia, mixed 10/05/2014  ? Benign hypertension 10/05/2014  ? Irritable bowel syndrome with constipation 10/05/2014  ? Mild intermittent asthma without complication 08/81/1031  ? Allergic rhinitis, seasonal 10/05/2014  ? DD (diverticular disease) 12/13/2013  ? ? ?Allergies  ?Allergen Reactions  ? Amlodipine Swelling  ? Flagyl [Metronidazole] Nausea Only  ? ? ?Past Surgical History:  ?Procedure Laterality Date  ? ABDOMINAL HYSTERECTOMY  1997  ? APPENDECTOMY  1997  ? COLONOSCOPY  2015  ? diverticulosis  ? PARTIAL HYSTERECTOMY  1979  ? bleeding from fibroids  ? TONSILLECTOMY  1966  ? UPPER GASTROINTESTINAL ENDOSCOPY  09/2014  ? gastritis/esoph; no Barrett's  ? ? ?Social History  ? ?Tobacco Use  ? Smoking status: Never  ? Smokeless tobacco: Never  ?Vaping Use  ? Vaping Use: Never used  ?Substance Use Topics  ? Alcohol use: Yes  ?  Alcohol/week: 5.0 standard drinks  ?  Types: 5 Shots of liquor per week  ?  Comment: Occasionally  ? Drug use: Not Currently   ? ? ? ?Medication list has been reviewed and updated. ? ?Current Meds  ?Medication Sig  ? albuterol (PROVENTIL) (2.5 MG/3ML) 0.083% nebulizer solution Inhale 3 mLs into the lungs 4 (four) times daily as needed. Reported on 08/16/2015  ? albuterol (VENTOLIN HFA) 108 (90 Base) MCG/ACT inhaler TAKE 2 PUFFS BY MOUTH EVERY 6 HOURS AS NEEDED FOR WHEEZE OR SHORTNESS OF BREATH  ? atorvastatin (LIPITOR) 40 MG tablet TAKE 1 TABLET BY MOUTH EVERYDAY AT BEDTIME  ? Cyanocobalamin (VITAMIN B-12 PO) Place 1 drop under the tongue daily.  ? fluticasone-salmeterol (ADVAIR HFA) 115-21 MCG/ACT inhaler Inhale 2 puffs into the lungs 2 (two) times daily.  ? hydrocortisone (ANUSOL-HC) 2.5 % rectal cream Place 1 application rectally 2 (two) times daily.  ? hyoscyamine (LEVSIN) 0.125 MG tablet Take 0.125 mg by mouth every 4 (four) hours as needed.  ? nitroGLYCERIN (NITROSTAT) 0.4 MG SL tablet Place 1 tablet (0.4 mg total) under the tongue every 5 (five) minutes as needed for chest pain.  ? VITAMIN D PO Take 2,000 Units by mouth daily.  ? [DISCONTINUED] lisinopril (ZESTRIL) 20 MG tablet TAKE 1 TABLET BY MOUTH EVERY DAY  ? [DISCONTINUED] metoprolol succinate (TOPROL-XL) 25 MG 24 hr tablet TAKE 1 TABLET (25 MG TOTAL) BY MOUTH DAILY.  ? [DISCONTINUED] omeprazole (PRILOSEC) 20 MG capsule TAKE 1 CAPSULE BY MOUTH EVERY DAY  ? [DISCONTINUED] sertraline (ZOLOFT) 50 MG tablet TAKE 1  TABLET BY MOUTH EVERY DAY  ? ? ? ?  10/08/2021  ?  8:18 AM 08/22/2021  ?  2:44 PM 01/09/2021  ? 10:32 AM 06/26/2020  ? 11:26 AM  ?GAD 7 : Generalized Anxiety Score  ?Nervous, Anxious, on Edge 0 0 1 0  ?Control/stop worrying 0 0 1 0  ?Worry too much - different things 0 0 1 0  ?Trouble relaxing 0 0 1 0  ?Restless 0 0 0 0  ?Easily annoyed or irritable 0 0 1 0  ?Afraid - awful might happen 0 0 0 0  ?Total GAD 7 Score 0 0 5 0  ?Anxiety Difficulty  Not difficult at all Somewhat difficult   ? ? ? ?  10/08/2021  ?  8:18 AM  ?Depression screen PHQ 2/9  ?Decreased Interest 0  ?Down,  Depressed, Hopeless 0  ?PHQ - 2 Score 0  ?Altered sleeping 0  ?Tired, decreased energy 0  ?Change in appetite 0  ?Feeling bad or failure about yourself  0  ?Trouble concentrating 0  ?Moving slowly or fidgety/restless 0  ?Suicid

## 2021-10-08 NOTE — Patient Instructions (Signed)
Take 2 lisinopril 20 mg until you fill the Rx for 40 mg once a day ? ?Watch for flu like symptoms or a target lesion within 7 days of the tick bites. ?

## 2021-10-09 LAB — LIPID PANEL
Chol/HDL Ratio: 3.1 ratio (ref 0.0–4.4)
Cholesterol, Total: 162 mg/dL (ref 100–199)
HDL: 53 mg/dL (ref >39)
LDL Chol Calc (NIH): 89 mg/dL (ref 0–99)
Triglycerides: 112 mg/dL (ref 0–149)
VLDL Cholesterol Cal: 20 mg/dL (ref 5–40)

## 2021-10-09 LAB — COMPREHENSIVE METABOLIC PANEL
ALT: 28 IU/L (ref 0–32)
AST: 21 IU/L (ref 0–40)
Albumin/Globulin Ratio: 1.6 (ref 1.2–2.2)
Albumin: 4.3 g/dL (ref 3.7–4.7)
Alkaline Phosphatase: 114 IU/L (ref 44–121)
BUN/Creatinine Ratio: 17 (ref 12–28)
BUN: 15 mg/dL (ref 8–27)
Bilirubin Total: 0.4 mg/dL (ref 0.0–1.2)
CO2: 21 mmol/L (ref 20–29)
Calcium: 9.6 mg/dL (ref 8.7–10.3)
Chloride: 105 mmol/L (ref 96–106)
Creatinine, Ser: 0.9 mg/dL (ref 0.57–1.00)
Globulin, Total: 2.7 g/dL (ref 1.5–4.5)
Glucose: 93 mg/dL (ref 70–99)
Potassium: 4.9 mmol/L (ref 3.5–5.2)
Sodium: 140 mmol/L (ref 134–144)
Total Protein: 7 g/dL (ref 6.0–8.5)
eGFR: 68 mL/min/{1.73_m2} (ref >59)

## 2021-10-09 LAB — CBC WITH DIFFERENTIAL/PLATELET
Basophils Absolute: 0.1 10*3/uL (ref 0.0–0.2)
Basos: 1 %
EOS (ABSOLUTE): 0.2 10*3/uL (ref 0.0–0.4)
Eos: 3 %
Hematocrit: 36.7 % (ref 34.0–46.6)
Hemoglobin: 12.6 g/dL (ref 11.1–15.9)
Immature Grans (Abs): 0.1 10*3/uL (ref 0.0–0.1)
Immature Granulocytes: 1 %
Lymphocytes Absolute: 1.9 10*3/uL (ref 0.7–3.1)
Lymphs: 26 %
MCH: 30.2 pg (ref 26.6–33.0)
MCHC: 34.3 g/dL (ref 31.5–35.7)
MCV: 88 fL (ref 79–97)
Monocytes Absolute: 0.5 10*3/uL (ref 0.1–0.9)
Monocytes: 6 %
Neutrophils Absolute: 4.8 10*3/uL (ref 1.4–7.0)
Neutrophils: 63 %
Platelets: 298 10*3/uL (ref 150–450)
RBC: 4.17 x10E6/uL (ref 3.77–5.28)
RDW: 13.4 % (ref 11.7–15.4)
WBC: 7.5 10*3/uL (ref 3.4–10.8)

## 2021-10-09 LAB — TSH: TSH: 4.55 u[IU]/mL — ABNORMAL HIGH (ref 0.450–4.500)

## 2021-10-09 NOTE — Telephone Encounter (Signed)
Refusing duplicate refill request for Zoloft 50 mg because it was received at [id number] on 10/08/2021 for #90, 3 refills at 8:35 AM.   ?

## 2021-10-26 ENCOUNTER — Encounter: Payer: Self-pay | Admitting: Internal Medicine

## 2021-10-26 ENCOUNTER — Ambulatory Visit: Payer: Self-pay

## 2021-10-26 ENCOUNTER — Ambulatory Visit (INDEPENDENT_AMBULATORY_CARE_PROVIDER_SITE_OTHER): Payer: Medicare PPO | Admitting: Internal Medicine

## 2021-10-26 VITALS — BP 150/86 | HR 74 | Ht 64.0 in | Wt 160.0 lb

## 2021-10-26 DIAGNOSIS — I10 Essential (primary) hypertension: Secondary | ICD-10-CM

## 2021-10-26 DIAGNOSIS — F411 Generalized anxiety disorder: Secondary | ICD-10-CM | POA: Diagnosis not present

## 2021-10-26 DIAGNOSIS — R079 Chest pain, unspecified: Secondary | ICD-10-CM | POA: Diagnosis not present

## 2021-10-26 DIAGNOSIS — W57XXXD Bitten or stung by nonvenomous insect and other nonvenomous arthropods, subsequent encounter: Secondary | ICD-10-CM

## 2021-10-26 NOTE — Telephone Encounter (Signed)
    Chief Complaint: Elevated BP x 2-3 days. Today 198/93. Symptoms: Headache Frequency: 2-3 days ago Pertinent Negatives: Patient denies chest pain or SOB Disposition: [] ED /[] Urgent Care (no appt availability in office) / [x] Appointment(In office/virtual)/ []  [facility]/ [] Home Care/ [] Refused Recommended Disposition /[] [place] [facility]/ []  Follow-up with PCP Additional Notes:   Reason for Disposition  Systolic BP  >= 99991111 OR Diastolic >= A999333  Answer Assessment - Initial Assessment Questions 1. BLOOD PRESSURE: "What is the blood pressure?" "Did you take at least two measurements 5 minutes apart?"     198/93 2. ONSET: "When did you take your blood pressure?"     Yesterday 3. HOW: "How did you obtain the blood pressure?" (e.g., visiting nurse, automatic home BP monitor)     Home cuff 4. HISTORY: "Do you have a history of high blood pressure?"     Yes 5. MEDICATIONS: "Are you taking any medications for blood pressure?" "Have you missed any doses recently?"     Yes, no missed 6. OTHER SYMPTOMS: "Do you have any symptoms?" (e.g., headache, chest pain, blurred vision, difficulty breathing, weakness)     Headache 7. PREGNANCY: "Is there any chance you are pregnant?" "When was your last menstrual period?"     no  Protocols used: Blood Pressure - High-A-AH

## 2021-10-26 NOTE — Progress Notes (Signed)
Date:  10/26/2021   Name:  Sarah Hernandez   DOB:  Jul 10, 1950   MRN:  [id number]   Chief Complaint: Hypertension (165-193/93 at home, headache ) and Panic Attack (SOB, chest tightness, chest feels heavy, 2 hours )  Hypertension This is a chronic problem. Associated symptoms include chest pain, headaches and malaise/fatigue. Pertinent negatives include no palpitations, PND or shortness of breath. Past treatments include ACE inhibitors. The current treatment provides moderate improvement.  Chest Pain  This is a new (she did not take NTG which she has on hand) problem. The current episode started today. The problem has been resolved. Pain location: upper chest. The pain is mild. The quality of the pain is described as pressure. Radiates to: very mild jaw pain but felt more like anxiety with chest pressure and heavy heart beats. Associated symptoms include headaches and malaise/fatigue. Pertinent negatives include no fever, palpitations, PND or shortness of breath.  Her past medical history is significant for hypertension.  Headache  This is a new problem. Episode onset: past three days. The problem has been unchanged. The pain is located in the Bilateral region. The pain does not radiate. The quality of the pain is described as aching and dull. The pain is moderate. Pertinent negatives include no fever. Associated symptoms comments: Fatigue and hx of recent tick bites. Her past medical history is significant for hypertension.   Lab Results  Component Value Date   NA 140 10/08/2021   K 4.9 10/08/2021   CO2 21 10/08/2021   GLUCOSE 93 10/08/2021   BUN 15 10/08/2021   CREATININE 0.90 10/08/2021   CALCIUM 9.6 10/08/2021   EGFR 68 10/08/2021   GFRNONAA 60 09/13/2019   Lab Results  Component Value Date   CHOL 162 10/08/2021   HDL 53 10/08/2021   LDLCALC 89 10/08/2021   TRIG 112 10/08/2021   CHOLHDL 3.1 10/08/2021   Lab Results  Component Value Date   TSH 4.550 (H) 10/08/2021   No  results found for: HGBA1C Lab Results  Component Value Date   WBC 7.5 10/08/2021   HGB 12.6 10/08/2021   HCT 36.7 10/08/2021   MCV 88 10/08/2021   PLT 298 10/08/2021   Lab Results  Component Value Date   ALT 28 10/08/2021   AST 21 10/08/2021   GGT 11 02/25/2019   ALKPHOS 114 10/08/2021   BILITOT 0.4 10/08/2021   No results found for: 25OHVITD2, 25OHVITD3, VD25OH   Review of Systems  Constitutional:  Positive for fatigue and malaise/fatigue. Negative for appetite change, chills and fever.  Respiratory:  Negative for choking, chest tightness, shortness of breath and wheezing.   Cardiovascular:  Positive for chest pain. Negative for palpitations, leg swelling and PND.  Neurological:  Positive for headaches.  Psychiatric/Behavioral:  Negative for dysphoric mood and sleep disturbance. The patient is nervous/anxious.    Patient Active Problem List   Diagnosis Date Noted   History of basal cell cancer 09/13/2019   Generalized anxiety disorder 09/09/2018   OAB (overactive bladder) 08/21/2016   Esophagitis, reflux 10/05/2014   Hemorrhoids, internal 10/05/2014   Hyperlipidemia, mixed 10/05/2014   Benign hypertension 10/05/2014   Irritable bowel syndrome with constipation 10/05/2014   Mild intermittent asthma without complication 96/29/5284   Allergic rhinitis, seasonal 10/05/2014   DD (diverticular disease) 12/13/2013    Allergies  Allergen Reactions   Amlodipine Swelling   Flagyl [Metronidazole] Nausea Only    Past Surgical History:  Procedure Laterality Date   ABDOMINAL HYSTERECTOMY  [place]   COLONOSCOPY  2015   diverticulosis   PARTIAL HYSTERECTOMY  1979   bleeding from fibroids   [place] ENDOSCOPY  09/2014   gastritis/esoph; no Barrett's    Social History   Tobacco Use   Smoking status: Never   Smokeless tobacco: Never  Vaping Use   Vaping Use: Never used  Substance Use Topics   Alcohol use: Yes     Alcohol/week: 5.0 standard drinks    Types: 5 Shots of liquor per week    Comment: Occasionally   Drug use: Not Currently     Medication list has been reviewed and updated.  Current Meds  Medication Sig   albuterol (PROVENTIL) (2.5 MG/3ML) 0.083% nebulizer solution Inhale 3 mLs into the lungs 4 (four) times daily as needed. Reported on 08/16/2015   albuterol (VENTOLIN HFA) 108 (90 Base) MCG/ACT inhaler TAKE 2 PUFFS BY MOUTH EVERY 6 HOURS AS NEEDED FOR WHEEZE OR SHORTNESS OF BREATH   atorvastatin (LIPITOR) 40 MG tablet TAKE 1 TABLET BY MOUTH EVERYDAY AT BEDTIME   Cyanocobalamin (VITAMIN B-12 PO) Place 1 drop under the tongue daily.   fluticasone-salmeterol (ADVAIR HFA) 115-21 MCG/ACT inhaler Inhale 2 puffs into the lungs 2 (two) times daily.   hydrocortisone (ANUSOL-HC) 2.5 % rectal cream Place 1 application rectally 2 (two) times daily.   hyoscyamine (LEVSIN) 0.125 MG tablet Take 0.125 mg by mouth every 4 (four) hours as needed.   lisinopril (ZESTRIL) 40 MG tablet Take 1 tablet (40 mg total) by mouth daily.   metoprolol succinate (TOPROL-XL) 25 MG 24 hr tablet Take 1 tablet (25 mg total) by mouth daily.   nitroGLYCERIN (NITROSTAT) 0.4 MG SL tablet Place 1 tablet (0.4 mg total) under the tongue every 5 (five) minutes as needed for chest pain.   omeprazole (PRILOSEC) 20 MG capsule TAKE 1 CAPSULE BY MOUTH EVERY DAY   sertraline (ZOLOFT) 50 MG tablet Take 1 tablet (50 mg total) by mouth daily.   VITAMIN D PO Take 2,000 Units by mouth daily.       10/26/2021    9:40 AM 10/08/2021    8:18 AM 08/22/2021    2:44 PM 01/09/2021   10:32 AM  GAD 7 : Generalized Anxiety Score  Nervous, Anxious, on Edge 0 0 0 1  Control/stop worrying 0 0 0 1  Worry too much - different things 0 0 0 1  Trouble relaxing 0 0 0 1  Restless 0 0 0 0  Easily annoyed or irritable 0 0 0 1  Afraid - awful might happen 0 0 0 0  Total GAD 7 Score 0 0 0 5  Anxiety Difficulty Not difficult at all  Not difficult at all  Somewhat difficult       10/26/2021    9:40 AM  Depression screen PHQ 2/9  Decreased Interest 0  Down, Depressed, Hopeless 0  PHQ - 2 Score 0  Altered sleeping 0  Tired, decreased energy 0  Change in appetite 0  Feeling bad or failure about yourself  0  Trouble concentrating 0  Moving slowly or fidgety/restless 0  Suicidal thoughts 0  PHQ-9 Score 0  Difficult doing work/chores Not difficult at all    BP Readings from Last 3 Encounters:  10/26/21 (!) 162/74  10/08/21 138/82  09/12/21 (!) 180/90    Physical Exam Vitals and nursing note reviewed.  Constitutional:      General: She is not in acute  distress.    Appearance: Normal appearance. She is well-developed.  HENT:     Head: Normocephalic and atraumatic.  Neck:     Vascular: No carotid bruit.  Cardiovascular:     Rate and Rhythm: Normal rate and regular rhythm.     Pulses: Normal pulses.     Heart sounds: No murmur heard. Pulmonary:     Effort: Pulmonary effort is normal. No respiratory distress.     Breath sounds: No wheezing or rhonchi.  Musculoskeletal:     Cervical back: Normal range of motion.     Right lower leg: No edema.     Left lower leg: No edema.  Lymphadenopathy:     Cervical: No cervical adenopathy.  Skin:    General: Skin is warm and dry.     Findings: Lesion (scattered red papules from tick bites) present. No rash.  Neurological:     General: No focal deficit present.     Mental Status: She is alert and oriented to person, place, and time.  Psychiatric:        Mood and Affect: Mood normal.        Behavior: Behavior normal.    Wt Readings from Last 3 Encounters:  10/26/21 160 lb (72.6 kg)  10/08/21 159 lb (72.1 kg)  09/12/21 161 lb 6.4 oz (73.2 kg)    BP (!) 162/74   Pulse 74   Ht $R'5\' 4"'GG$  (1.626 m)   Wt 160 lb (72.6 kg)   SpO2 97%   BMI 27.46 kg/m   Assessment and Plan: 1. Chest pain, unspecified type Hx of stable angina seen by Dr. Clayborn Bigness.  This episode did not feel like  previous anginal symptoms except for fleeting jaw discomfort that has now resolved. Recommend using NTG as needed but go to ED if pain recurs/persists See Dr. Clayborn Bigness for follow up - EKG 12-Lead - SR @ 67, neg precordial [place] unchanged from 2021 - Comprehensive metabolic panel  2. Benign hypertension BS is slightly elevated today but not of immediate concern Continue current regimen Monitor at home with arm cuff - call if persistently  > 150 - Comprehensive metabolic panel  3. Generalized anxiety disorder On Sertraline with generally good control. Today's episode may be due to increased anxiety recently but can not exclude cardiac source  4. Tick bite, unspecified site, subsequent encounter Now with headache and fatigue will need to rule out tick borne illness - [place] mtn spotted fvr abs pnl(IgG+IgM) - Lyme Disease Serology w/Reflex - CBC with Differential/Platelet   Partially dictated using Editor, commissioning. Any errors are unintentional.  Halina Maidens, MD [facility]  10/26/2021

## 2021-10-30 ENCOUNTER — Encounter: Payer: Self-pay | Admitting: Internal Medicine

## 2021-10-31 LAB — LYME DISEASE SEROLOGY W/REFLEX: Lyme Total Antibody EIA: NEGATIVE

## 2021-10-31 LAB — CBC WITH DIFFERENTIAL/PLATELET
Basophils Absolute: 0 10*3/uL (ref 0.0–0.2)
Basos: 1 %
EOS (ABSOLUTE): 0.2 10*3/uL (ref 0.0–0.4)
Eos: 4 %
Hematocrit: 35.3 % (ref 34.0–46.6)
Hemoglobin: 12 g/dL (ref 11.1–15.9)
Immature Grans (Abs): 0 10*3/uL (ref 0.0–0.1)
Immature Granulocytes: 0 %
Lymphocytes Absolute: 1.7 10*3/uL (ref 0.7–3.1)
Lymphs: 28 %
MCH: 30.4 pg (ref 26.6–33.0)
MCHC: 34 g/dL (ref 31.5–35.7)
MCV: 89 fL (ref 79–97)
Monocytes Absolute: 0.4 10*3/uL (ref 0.1–0.9)
Monocytes: 6 %
Neutrophils Absolute: 3.9 10*3/uL (ref 1.4–7.0)
Neutrophils: 61 %
Platelets: 279 10*3/uL (ref 150–450)
RBC: 3.95 x10E6/uL (ref 3.77–5.28)
RDW: 12.8 % (ref 11.7–15.4)
WBC: 6.2 10*3/uL (ref 3.4–10.8)

## 2021-10-31 LAB — ROCKY MTN SPOTTED FVR ABS PNL(IGG+IGM)
RMSF IgG: NEGATIVE
RMSF IgM: 0.48 index (ref 0.00–0.89)

## 2021-10-31 LAB — COMPREHENSIVE METABOLIC PANEL
ALT: 25 IU/L (ref 0–32)
AST: 18 IU/L (ref 0–40)
Albumin/Globulin Ratio: 1.7 (ref 1.2–2.2)
Albumin: 4.1 g/dL (ref 3.7–4.7)
Alkaline Phosphatase: 103 IU/L (ref 44–121)
BUN/Creatinine Ratio: 17 (ref 12–28)
BUN: 15 mg/dL (ref 8–27)
Bilirubin Total: 0.4 mg/dL (ref 0.0–1.2)
CO2: 22 mmol/L (ref 20–29)
Calcium: 9.2 mg/dL (ref 8.7–10.3)
Chloride: 105 mmol/L (ref 96–106)
Creatinine, Ser: 0.9 mg/dL (ref 0.57–1.00)
Globulin, Total: 2.4 g/dL (ref 1.5–4.5)
Glucose: 103 mg/dL — ABNORMAL HIGH (ref 70–99)
Potassium: 4.7 mmol/L (ref 3.5–5.2)
Sodium: 137 mmol/L (ref 134–144)
Total Protein: 6.5 g/dL (ref 6.0–8.5)
eGFR: 68 mL/min/{1.73_m2} (ref >59)

## 2021-11-14 DIAGNOSIS — Z1231 Encounter for screening mammogram for malignant neoplasm of breast: Secondary | ICD-10-CM | POA: Diagnosis not present

## 2021-11-14 DIAGNOSIS — M810 Age-related osteoporosis without current pathological fracture: Secondary | ICD-10-CM | POA: Diagnosis not present

## 2021-11-15 ENCOUNTER — Telehealth: Payer: Self-pay | Admitting: Internal Medicine

## 2021-11-15 DIAGNOSIS — F419 Anxiety disorder, unspecified: Secondary | ICD-10-CM | POA: Diagnosis not present

## 2021-11-15 DIAGNOSIS — J45909 Unspecified asthma, uncomplicated: Secondary | ICD-10-CM | POA: Diagnosis not present

## 2021-11-15 DIAGNOSIS — I10 Essential (primary) hypertension: Secondary | ICD-10-CM | POA: Diagnosis not present

## 2021-11-15 DIAGNOSIS — E785 Hyperlipidemia, unspecified: Secondary | ICD-10-CM | POA: Diagnosis not present

## 2021-11-15 DIAGNOSIS — K219 Gastro-esophageal reflux disease without esophagitis: Secondary | ICD-10-CM | POA: Diagnosis not present

## 2021-11-15 NOTE — Telephone Encounter (Signed)
Let patient know that her bone density was normal. She should take calcium and vitamin D daily.  Would repeat in 3 years.

## 2021-11-15 NOTE — Telephone Encounter (Signed)
Patient informed. 

## 2021-12-13 ENCOUNTER — Other Ambulatory Visit: Payer: Self-pay | Admitting: Internal Medicine

## 2021-12-13 DIAGNOSIS — E782 Mixed hyperlipidemia: Secondary | ICD-10-CM

## 2021-12-13 NOTE — Telephone Encounter (Signed)
Requested Prescriptions  Pending Prescriptions Disp Refills  . atorvastatin (LIPITOR) 40 MG tablet [Pharmacy Med Name: ATORVASTATIN 40 MG TABLET] 90 tablet 0    Sig: TAKE 1 TABLET BY MOUTH EVERYDAY AT BEDTIME     Cardiovascular:  Antilipid - Statins Failed - 12/13/2021  2:07 AM      Failed - Lipid Panel in normal range within the last 12 months    Cholesterol, Total  Date Value Ref Range Status  10/08/2021 162 100 - 199 mg/dL Final   LDL Chol Calc (NIH)  Date Value Ref Range Status  10/08/2021 89 0 - 99 mg/dL Final   HDL  Date Value Ref Range Status  10/08/2021 53 >39 mg/dL Final   Triglycerides  Date Value Ref Range Status  10/08/2021 112 0 - 149 mg/dL Final         Passed - Patient is not pregnant      Passed - Valid encounter within last 12 months    Recent Outpatient Visits          1 month ago Chest pain, unspecified type   [facility] Reubin Milan, MD   2 months ago Annual physical exam   [facility] Reubin Milan, MD   3 months ago Asthma exacerbation, mild   [facility] Reubin Milan, MD   11 months ago Diverticulitis of large intestine without perforation or abscess without bleeding   [facility] Reubin Milan, MD   1 year ago Annual physical exam   [facility] Reubin Milan, MD      Future Appointments            In 3 months Judithann Graves Nyoka Cowden, MD [facility]   In 10 months Judithann Graves Nyoka Cowden, MD [facility]

## 2022-01-20 IMAGING — CR DG CHEST 2V
1 series · 2 of 2 positions shown · non-contrast
Comparison: May 05, 2018

CLINICAL DATA: Chest pain

EXAM:
CHEST - 2 VIEW

[Series 1: dg chest 2 view · 0.14mm/px · 2 of 2 slices shown]
[im 1/2]
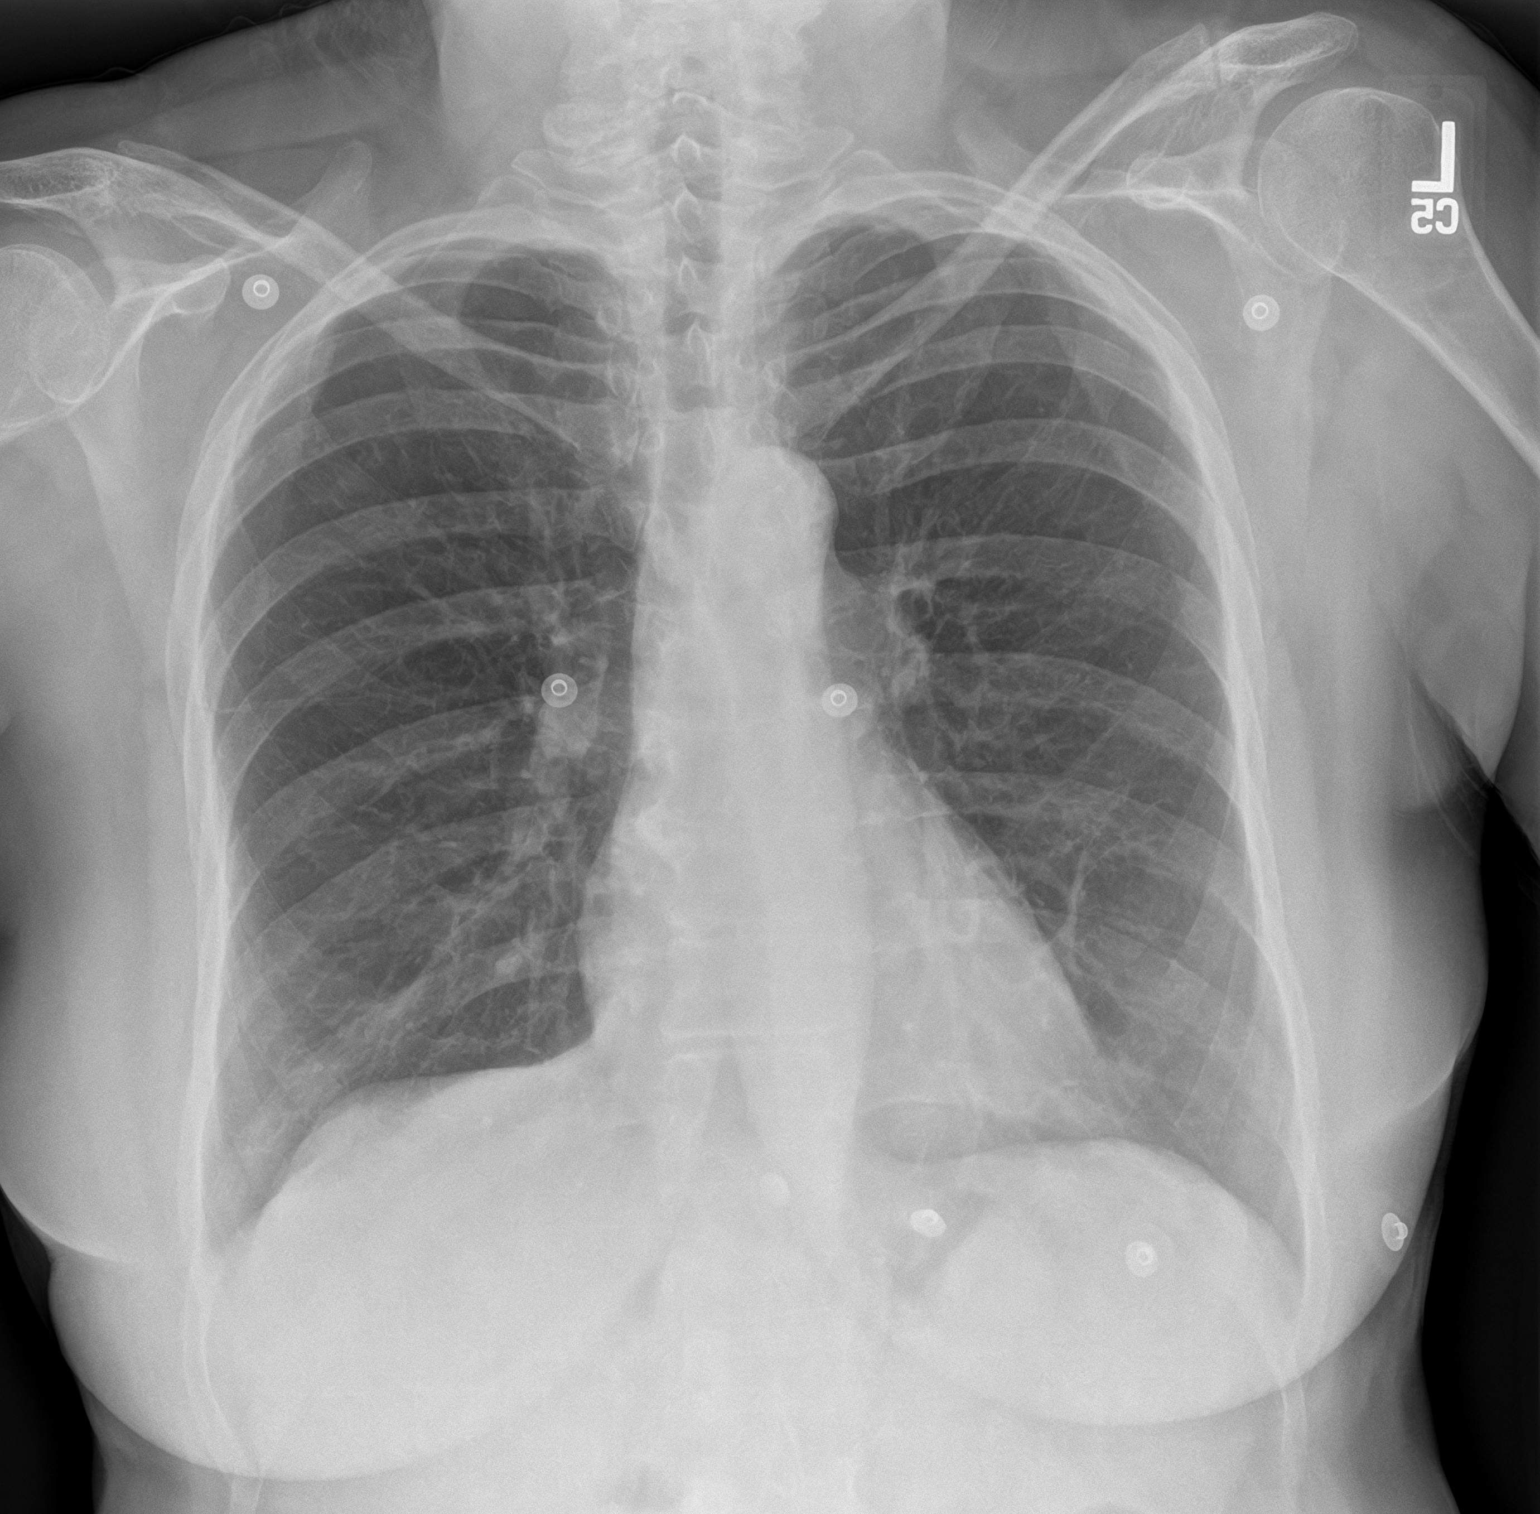
[im 2/2]
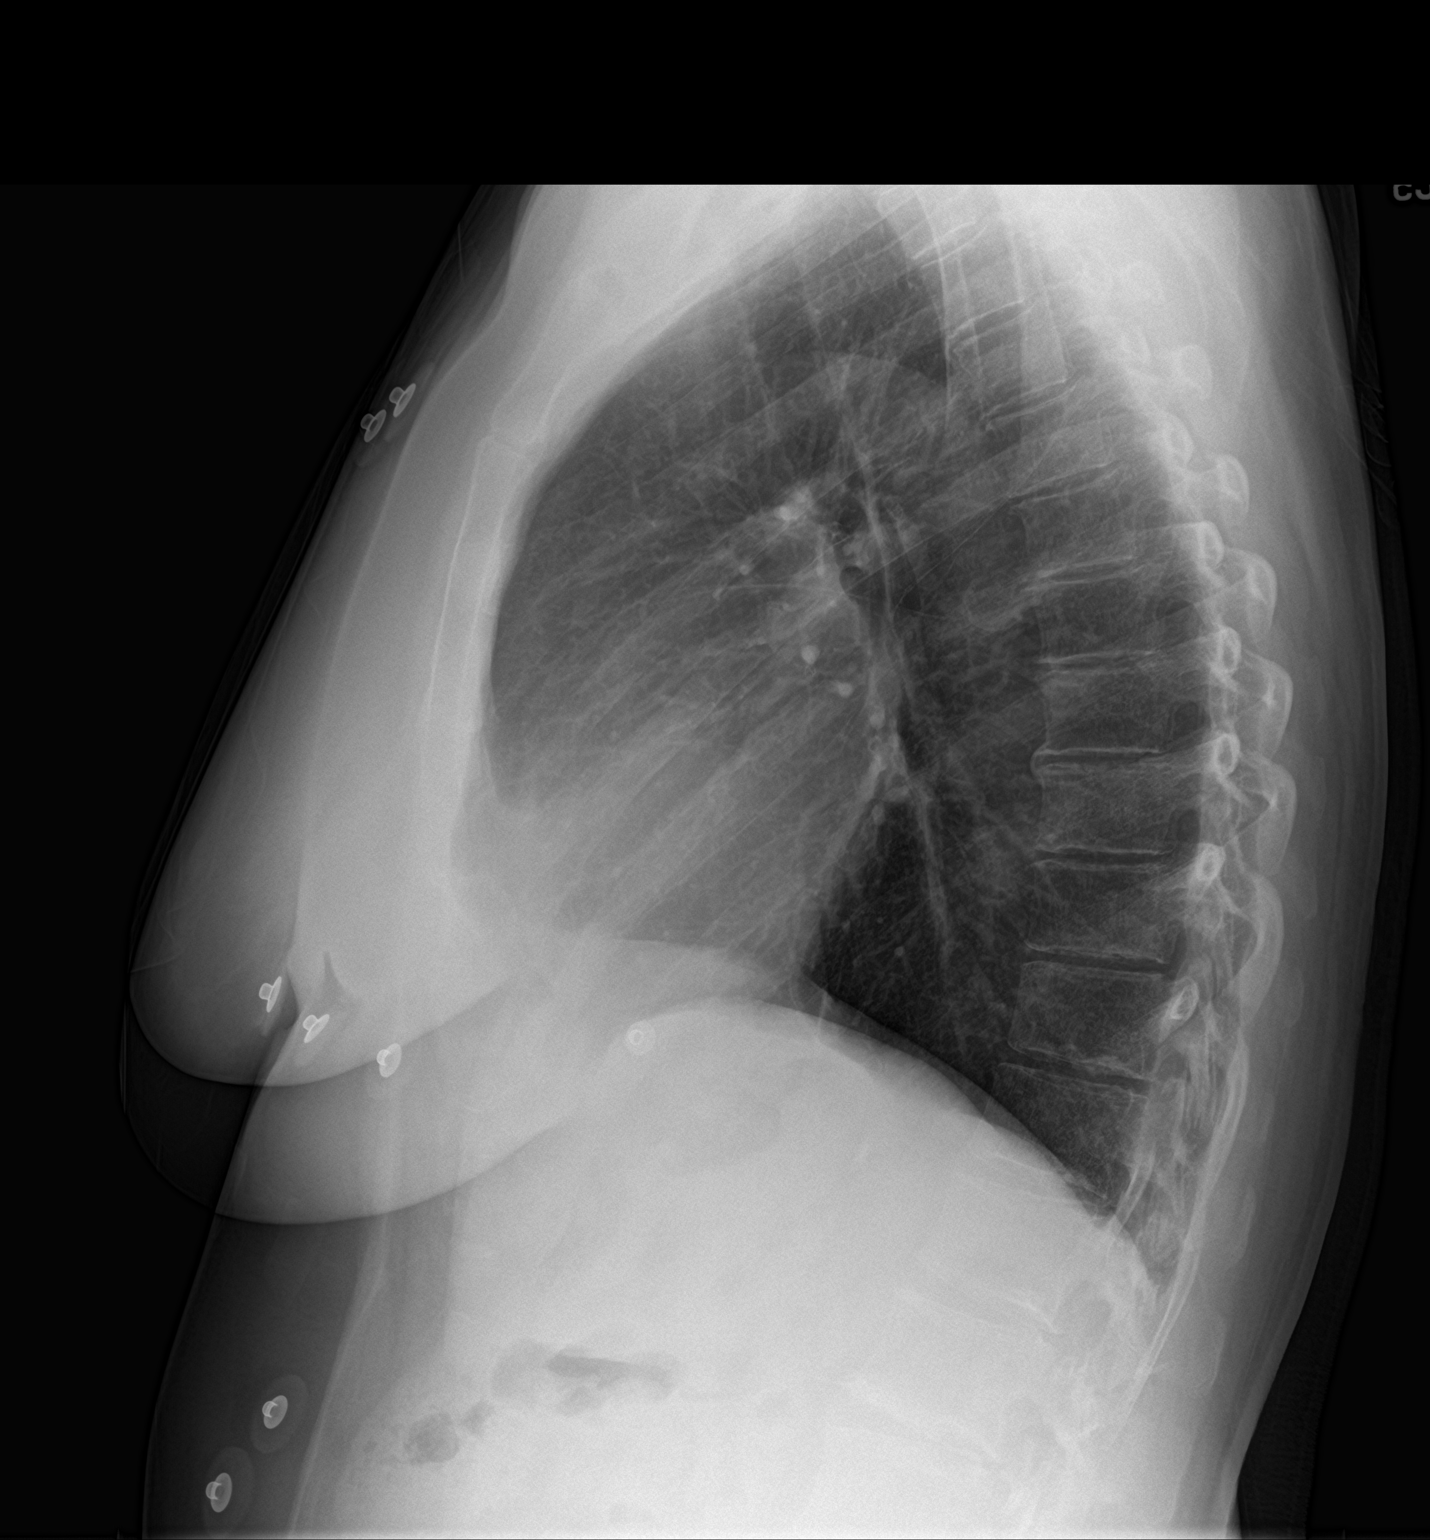

[2 of 2 positions shown; findings below may reference images not displayed]

FINDINGS: There is mild scarring in the left lower lung region, stable. Lungs
elsewhere are clear. Heart size and pulmonary vascularity are
normal. No adenopathy. No bone lesions. No pneumothorax.
IMPRESSION: Stable scarring left lower lobe region. Lungs elsewhere clear.
Stable cardiac silhouette. No adenopathy.

## 2022-01-23 ENCOUNTER — Encounter: Payer: Self-pay | Admitting: Internal Medicine

## 2022-01-23 ENCOUNTER — Ambulatory Visit: Payer: Medicare PPO | Admitting: Internal Medicine

## 2022-01-23 VITALS — BP 126/72 | HR 82 | Temp 98.3°F | Ht 64.0 in | Wt 157.0 lb

## 2022-01-23 DIAGNOSIS — J45901 Unspecified asthma with (acute) exacerbation: Secondary | ICD-10-CM | POA: Diagnosis not present

## 2022-01-23 MED ORDER — AZITHROMYCIN 250 MG PO TABS: ORAL_TABLET | ORAL | 0 refills | Status: AC

## 2022-01-23 MED ORDER — PREDNISONE 10 MG PO TABS: 10.0000 mg | ORAL_TABLET | ORAL | 0 refills | Status: AC

## 2022-01-23 NOTE — Progress Notes (Signed)
Date:  01/23/2022   Name:  Sarah Hernandez   DOB:  04-27-1951   MRN:  [id number]   Chief Complaint: Cough (Had a asthma attack on Monday, feels like she has a head cold, wants to make sure its not bronchitis)  Cough This is a new problem. Episode onset: X3 days. The problem has been unchanged. The problem occurs constantly. Associated symptoms include wheezing. Pertinent negatives include no chest pain, chills, fever, headaches or sore throat. She has tried steroid inhaler for the symptoms. Her past medical history is significant for asthma and bronchitis.  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  She has some slight yellow production and chest heaviness.  No fever or chills.  She has a home nebulizer which she has used with some benefit.  Still feeling tight in her chest today.  On Advair daily.  Lab Results  Component Value Date   NA 137 10/26/2021   K 4.7 10/26/2021   CO2 22 10/26/2021   GLUCOSE 103 (H) 10/26/2021   BUN 15 10/26/2021   CREATININE 0.90 10/26/2021   CALCIUM 9.2 10/26/2021   EGFR 68 10/26/2021   GFRNONAA 60 09/13/2019   Lab Results  Component Value Date   CHOL 162 10/08/2021   HDL 53 10/08/2021   LDLCALC 89 10/08/2021   TRIG 112 10/08/2021   CHOLHDL 3.1 10/08/2021   Lab Results  Component Value Date   TSH 4.550 (H) 10/08/2021   No results found for: "HGBA1C" Lab Results  Component Value Date   WBC 6.2 10/26/2021   HGB 12.0 10/26/2021   HCT 35.3 10/26/2021   MCV 89 10/26/2021   PLT 279 10/26/2021   Lab Results  Component Value Date   ALT 25 10/26/2021   AST 18 10/26/2021   GGT 11 02/25/2019   ALKPHOS 103 10/26/2021   BILITOT 0.4 10/26/2021   No results found for: "25OHVITD2", "25OHVITD3", "VD25OH"   Review of Systems  Constitutional:  Negative for chills, fatigue and fever.  HENT:  Negative for sinus pressure and sore throat.   Eyes:  Positive for discharge.  Negative for visual disturbance.  Respiratory:  Positive for cough, chest tightness and wheezing.   Cardiovascular:  Negative for chest pain and palpitations.  Neurological:  Negative for dizziness, light-headedness and headaches.  Psychiatric/Behavioral:  Negative for dysphoric mood and sleep disturbance. The patient is not nervous/anxious.     Patient Active Problem List   Diagnosis Date Noted   History of basal cell cancer 09/13/2019   Generalized anxiety disorder 09/09/2018   OAB (overactive bladder) 08/21/2016   Esophagitis, reflux 10/05/2014   Hemorrhoids, internal 10/05/2014   Hyperlipidemia, mixed 10/05/2014   Benign hypertension 10/05/2014   Irritable bowel syndrome with constipation 10/05/2014   Mild intermittent asthma without complication 10/05/2014   Allergic rhinitis, seasonal 10/05/2014   DD (diverticular disease) 12/13/2013    Allergies  Allergen Reactions   Amlodipine Swelling   Flagyl [Metronidazole] Nausea Only    Past Surgical History:  Procedure Laterality Date   ABDOMINAL HYSTERECTOMY  1997   APPENDECTOMY  1997   COLONOSCOPY  2015   diverticulosis   PARTIAL HYSTERECTOMY  1979   bleeding from fibroids   TONSILLECTOMY  1966   UPPER GASTROINTESTINAL ENDOSCOPY  09/2014   gastritis/esoph; no Barrett's    Social History   Tobacco Use   Smoking status: Never   Smokeless tobacco: Never  Vaping Use   Vaping  Use: Never used  Substance Use Topics   Alcohol use: Yes    Alcohol/week: 5.0 standard drinks of alcohol    Types: 5 Shots of liquor per week    Comment: Occasionally   Drug use: Not Currently     Medication list has been reviewed and updated.  Current Meds  Medication Sig   albuterol (PROVENTIL) (2.5 MG/3ML) 0.083% nebulizer solution Inhale 3 mLs into the lungs 4 (four) times daily as needed. Reported on 08/16/2015   albuterol (VENTOLIN HFA) 108 (90 Base) MCG/ACT inhaler TAKE 2 PUFFS BY MOUTH EVERY 6 HOURS AS NEEDED FOR WHEEZE OR SHORTNESS  OF BREATH   atorvastatin (LIPITOR) 40 MG tablet TAKE 1 TABLET BY MOUTH EVERYDAY AT BEDTIME   chlorthalidone (HYGROTON) 25 MG tablet Take 12.5 mg by mouth daily.   Cyanocobalamin (VITAMIN B-12 PO) Place 1 drop under the tongue daily.   fluticasone-salmeterol (ADVAIR HFA) 115-21 MCG/ACT inhaler Inhale 2 puffs into the lungs 2 (two) times daily.   hydrocortisone (ANUSOL-HC) 2.5 % rectal cream Place 1 application rectally 2 (two) times daily.   hyoscyamine (LEVSIN) 0.125 MG tablet Take 0.125 mg by mouth every 4 (four) hours as needed.   lisinopril (ZESTRIL) 40 MG tablet Take 1 tablet (40 mg total) by mouth daily.   metoprolol succinate (TOPROL-XL) 25 MG 24 hr tablet Take 1 tablet (25 mg total) by mouth daily.   nitroGLYCERIN (NITROSTAT) 0.4 MG SL tablet Place 1 tablet (0.4 mg total) under the tongue every 5 (five) minutes as needed for chest pain.   omeprazole (PRILOSEC) 20 MG capsule TAKE 1 CAPSULE BY MOUTH EVERY DAY   sertraline (ZOLOFT) 50 MG tablet Take 1 tablet (50 mg total) by mouth daily.   VITAMIN D PO Take 2,000 Units by mouth daily.       01/23/2022    4:08 PM 10/26/2021    9:40 AM 10/08/2021    8:18 AM 08/22/2021    2:44 PM  GAD 7 : Generalized Anxiety Score  Nervous, Anxious, on Edge 0 0 0 0  Control/stop worrying 0 0 0 0  Worry too much - different things 0 0 0 0  Trouble relaxing 0 0 0 0  Restless 0 0 0 0  Easily annoyed or irritable 0 0 0 0  Afraid - awful might happen 0 0 0 0  Total GAD 7 Score 0 0 0 0  Anxiety Difficulty Not difficult at all Not difficult at all  Not difficult at all       01/23/2022    4:08 PM 10/26/2021    9:40 AM 10/08/2021    8:18 AM  Depression screen PHQ 2/9  Decreased Interest 0 0 0  Down, Depressed, Hopeless 0 0 0  PHQ - 2 Score 0 0 0  Altered sleeping 0 0 0  Tired, decreased energy 0 0 0  Change in appetite 0 0 0  Feeling bad or failure about yourself  0 0 0  Trouble concentrating 0 0 0  Moving slowly or fidgety/restless 0 0 0  Suicidal  thoughts 0 0 0  PHQ-9 Score 0 0 0  Difficult doing work/chores Not difficult at all Not difficult at all Not difficult at all    BP Readings from Last 3 Encounters:  01/23/22 126/72  10/26/21 (!) 150/86  10/08/21 138/82    Physical Exam Vitals and nursing note reviewed.  Constitutional:      General: She is not in acute distress.    Appearance: Normal appearance. She  is well-developed.  HENT:     Head: Normocephalic and atraumatic.  Eyes:     General: Lids are normal.     Extraocular Movements: Extraocular movements intact.     Comments: Excess thin tears bilaterally - no crusting or redness  Cardiovascular:     Rate and Rhythm: Normal rate and regular rhythm.  Pulmonary:     Effort: Pulmonary effort is normal. No respiratory distress.     Breath sounds: Wheezing (both upper lungs) present. No rhonchi.     Comments: Good air movement Musculoskeletal:     Cervical back: Normal range of motion.  Lymphadenopathy:     Cervical: No cervical adenopathy.  Skin:    General: Skin is warm and dry.     Findings: No rash.  Neurological:     Mental Status: She is alert and oriented to person, place, and time.  Psychiatric:        Mood and Affect: Mood normal.        Behavior: Behavior normal.     Wt Readings from Last 3 Encounters:  01/23/22 157 lb (71.2 kg)  10/26/21 160 lb (72.6 kg)  10/08/21 159 lb (72.1 kg)    BP 126/72   Pulse 82   Temp 98.3 F (36.8 C) (Oral)   Ht $R'5\' 4"'FF$  (1.626 m)   Wt 157 lb (71.2 kg)   SpO2 96%   BMI 26.95 kg/m   Assessment and Plan: 1. Asthma exacerbation, mild Recommend nebulizer albuterol bid - qid as needed Continue Advair Use Flonase daily to help with eye symptoms  and Claritin or Allegra daily - predniSONE (DELTASONE) 10 MG tablet; Take 1 tablet (10 mg total) by mouth as directed for 6 days. Take 6,5,4,3,2,1 then stop  Dispense: 21 tablet; Refill: 0 - azithromycin (ZITHROMAX Z-PAK) 250 MG tablet; UAD  Dispense: 6 each; Refill:  0   Partially dictated using Editor, commissioning. Any errors are unintentional.  Halina Maidens, MD [facility]  01/23/2022

## 2022-01-23 NOTE — Patient Instructions (Signed)
Use Flonase daily  Use Claritin or Zyrtec or Allegra  Nebulizer at least twice a day but can be 4 times per day  Continue Advair

## 2022-01-31 ENCOUNTER — Ambulatory Visit
Admission: RE | Admit: 2022-01-31 | Discharge: 2022-01-31 | Disposition: A | Discharge status: Home / Self Care | Payer: Medicare PPO | Source: Ambulatory Visit | Attending: Family Medicine | Admitting: Family Medicine

## 2022-01-31 ENCOUNTER — Ambulatory Visit: Payer: Self-pay

## 2022-01-31 ENCOUNTER — Ambulatory Visit: Payer: Medicare PPO | Admitting: Family Medicine

## 2022-01-31 ENCOUNTER — Other Ambulatory Visit: Payer: Self-pay | Admitting: Internal Medicine

## 2022-01-31 ENCOUNTER — Ambulatory Visit
Admission: RE | Admit: 2022-01-31 | Discharge: 2022-01-31 | Disposition: A | Discharge status: Home / Self Care | Payer: Medicare PPO | Attending: Family Medicine | Admitting: Family Medicine

## 2022-01-31 ENCOUNTER — Encounter: Payer: Self-pay | Admitting: Family Medicine

## 2022-01-31 VITALS — BP 132/68 | HR 86 | Ht 64.0 in | Wt 153.0 lb

## 2022-01-31 DIAGNOSIS — R062 Wheezing: Secondary | ICD-10-CM | POA: Diagnosis not present

## 2022-01-31 DIAGNOSIS — J45901 Unspecified asthma with (acute) exacerbation: Secondary | ICD-10-CM

## 2022-01-31 DIAGNOSIS — R059 Cough, unspecified: Secondary | ICD-10-CM | POA: Diagnosis not present

## 2022-01-31 MED ORDER — IPRATROPIUM-ALBUTEROL 0.5-2.5 (3) MG/3ML IN SOLN: 3.0000 mL | Freq: Four times a day (QID) | RESPIRATORY_TRACT | 1 refills | Status: DC | PRN

## 2022-01-31 MED ORDER — PREDNISONE 10 MG PO TABS: 10.0000 mg | ORAL_TABLET | Freq: Every day | ORAL | 0 refills | Status: DC

## 2022-01-31 NOTE — Progress Notes (Deleted)
Date:  01/31/2022   Name:  Sarah Hernandez   DOB:  08/09/1950   MRN:  [id number]   Chief Complaint: No chief complaint on file.  HPI  Lab Results  Component Value Date   NA 137 10/26/2021   K 4.7 10/26/2021   CO2 22 10/26/2021   GLUCOSE 103 (H) 10/26/2021   BUN 15 10/26/2021   CREATININE 0.90 10/26/2021   CALCIUM 9.2 10/26/2021   EGFR 68 10/26/2021   GFRNONAA 60 09/13/2019   Lab Results  Component Value Date   CHOL 162 10/08/2021   HDL 53 10/08/2021   LDLCALC 89 10/08/2021   TRIG 112 10/08/2021   CHOLHDL 3.1 10/08/2021   Lab Results  Component Value Date   TSH 4.550 (H) 10/08/2021   No results found for: "HGBA1C" Lab Results  Component Value Date   WBC 6.2 10/26/2021   HGB 12.0 10/26/2021   HCT 35.3 10/26/2021   MCV 89 10/26/2021   PLT 279 10/26/2021   Lab Results  Component Value Date   ALT 25 10/26/2021   AST 18 10/26/2021   GGT 11 02/25/2019   ALKPHOS 103 10/26/2021   BILITOT 0.4 10/26/2021   No results found for: "25OHVITD2", "25OHVITD3", "VD25OH"   Review of Systems  Patient Active Problem List   Diagnosis Date Noted   History of basal cell cancer 09/13/2019   Generalized anxiety disorder 09/09/2018   OAB (overactive bladder) 08/21/2016   Esophagitis, reflux 10/05/2014   Hemorrhoids, internal 10/05/2014   Hyperlipidemia, mixed 10/05/2014   Benign hypertension 10/05/2014   Irritable bowel syndrome with constipation 10/05/2014   Mild intermittent asthma without complication 99/35/7017   Allergic rhinitis, seasonal 10/05/2014   DD (diverticular disease) 12/13/2013    Allergies  Allergen Reactions   Amlodipine Swelling   Flagyl [Metronidazole] Nausea Only    Past Surgical History:  Procedure Laterality Date   [place]   COLONOSCOPY  2015   diverticulosis   PARTIAL HYSTERECTOMY  1979   bleeding from fibroids   [place] ENDOSCOPY  09/2014   gastritis/esoph;  no Barrett's    Social History   Tobacco Use   Smoking status: Never   Smokeless tobacco: Never  Vaping Use   Vaping Use: Never used  Substance Use Topics   Alcohol use: Yes    Alcohol/week: 5.0 standard drinks of alcohol    Types: 5 Shots of liquor per week    Comment: Occasionally   Drug use: Not Currently     Medication list has been reviewed and updated.  No outpatient medications have been marked as taking for the 01/31/22 encounter (Office Visit) with Juline Patch, MD.       01/23/2022    4:08 PM 10/26/2021    9:40 AM 10/08/2021    8:18 AM 08/22/2021    2:44 PM  GAD 7 : Generalized Anxiety Score  Nervous, Anxious, on Edge 0 0 0 0  Control/stop worrying 0 0 0 0  Worry too much - different things 0 0 0 0  Trouble relaxing 0 0 0 0  Restless 0 0 0 0  Easily annoyed or irritable 0 0 0 0  Afraid - awful might happen 0 0 0 0  Total GAD 7 Score 0 0 0 0  Anxiety Difficulty Not difficult at all Not difficult at all  Not difficult at all       01/23/2022  4:08 PM 10/26/2021    9:40 AM 10/08/2021    8:18 AM  Depression screen PHQ 2/9  Decreased Interest 0 0 0  Down, Depressed, Hopeless 0 0 0  PHQ - 2 Score 0 0 0  Altered sleeping 0 0 0  Tired, decreased energy 0 0 0  Change in appetite 0 0 0  Feeling bad or failure about yourself  0 0 0  Trouble concentrating 0 0 0  Moving slowly or fidgety/restless 0 0 0  Suicidal thoughts 0 0 0  PHQ-9 Score 0 0 0  Difficult doing work/chores Not difficult at all Not difficult at all Not difficult at all    BP Readings from Last 3 Encounters:  01/23/22 126/72  10/26/21 (!) 150/86  10/08/21 138/82    Physical Exam  Wt Readings from Last 3 Encounters:  01/23/22 157 lb (71.2 kg)  10/26/21 160 lb (72.6 kg)  10/08/21 159 lb (72.1 kg)    There were no vitals taken for this visit.  Assessment and Plan:     

## 2022-01-31 NOTE — Progress Notes (Signed)
Date:  01/31/2022   Name:  Sarah Hernandez   DOB:  1951/03/20   MRN:  [id number]   Chief Complaint: Asthma (No improvement, chest tightness,wheezing, completed prednisone, no fever/)  Asthma She complains of chest tightness, cough, frequent throat clearing, shortness of breath and wheezing. There is no hemoptysis or sputum production. This is a recurrent problem. The current episode started in the past 7 days. The problem has been waxing and waning. The cough is productive of sputum ("little bit of yellow"). Associated symptoms include malaise/fatigue, postnasal drip and sweats. Pertinent negatives include no chest pain, orthopnea, PND or sore throat. Associated symptoms comments: diaphoresis. Her past medical history is significant for asthma.    Lab Results  Component Value Date   NA 137 10/26/2021   K 4.7 10/26/2021   CO2 22 10/26/2021   GLUCOSE 103 (H) 10/26/2021   BUN 15 10/26/2021   CREATININE 0.90 10/26/2021   CALCIUM 9.2 10/26/2021   EGFR 68 10/26/2021   GFRNONAA 60 09/13/2019   Lab Results  Component Value Date   CHOL 162 10/08/2021   HDL 53 10/08/2021   LDLCALC 89 10/08/2021   TRIG 112 10/08/2021   CHOLHDL 3.1 10/08/2021   Lab Results  Component Value Date   TSH 4.550 (H) 10/08/2021   No results found for: "HGBA1C" Lab Results  Component Value Date   WBC 6.2 10/26/2021   HGB 12.0 10/26/2021   HCT 35.3 10/26/2021   MCV 89 10/26/2021   PLT 279 10/26/2021   Lab Results  Component Value Date   ALT 25 10/26/2021   AST 18 10/26/2021   GGT 11 02/25/2019   ALKPHOS 103 10/26/2021   BILITOT 0.4 10/26/2021   No results found for: "25OHVITD2", "25OHVITD3", "VD25OH"   Review of Systems  Constitutional:  Positive for malaise/fatigue.  HENT:  Positive for postnasal drip. Negative for nosebleeds and sore throat.   Respiratory:  Positive for cough, shortness of breath and wheezing. Negative for hemoptysis, sputum production and chest tightness.   Cardiovascular:   Negative for chest pain, palpitations, leg swelling and PND.    Patient Active Problem List   Diagnosis Date Noted   History of basal cell cancer 09/13/2019   Generalized anxiety disorder 09/09/2018   OAB (overactive bladder) 08/21/2016   Esophagitis, reflux 10/05/2014   Hemorrhoids, internal 10/05/2014   Hyperlipidemia, mixed 10/05/2014   Benign hypertension 10/05/2014   Irritable bowel syndrome with constipation 10/05/2014   Mild intermittent asthma without complication 76/19/5093   Allergic rhinitis, seasonal 10/05/2014   DD (diverticular disease) 12/13/2013    Allergies  Allergen Reactions   Amlodipine Swelling   Flagyl [Metronidazole] Nausea Only    Past Surgical History:  Procedure Laterality Date   [place]   COLONOSCOPY  2015   diverticulosis   PARTIAL HYSTERECTOMY  1979   bleeding from fibroids   [place] ENDOSCOPY  09/2014   gastritis/esoph; no Barrett's    Social History   Tobacco Use   Smoking status: Never   Smokeless tobacco: Never  Vaping Use   Vaping Use: Never used  Substance Use Topics   Alcohol use: Yes    Alcohol/week: 5.0 standard drinks of alcohol    Types: 5 Shots of liquor per week    Comment: Occasionally   Drug use: Not Currently     Medication list has been reviewed and updated.  Current Meds  Medication Sig  albuterol (PROVENTIL) (2.5 MG/3ML) 0.083% nebulizer solution Inhale 3 mLs into the lungs 4 (four) times daily as needed. Reported on 08/16/2015   albuterol (VENTOLIN HFA) 108 (90 Base) MCG/ACT inhaler TAKE 2 PUFFS BY MOUTH EVERY 6 HOURS AS NEEDED FOR WHEEZE OR SHORTNESS OF BREATH   atorvastatin (LIPITOR) 40 MG tablet TAKE 1 TABLET BY MOUTH EVERYDAY AT BEDTIME   chlorthalidone (HYGROTON) 25 MG tablet Take 12.5 mg by mouth daily.   Cyanocobalamin (VITAMIN B-12 PO) Place 1 drop under the tongue daily.   fluticasone-salmeterol (ADVAIR HFA) 115-21  MCG/ACT inhaler Inhale 2 puffs into the lungs 2 (two) times daily.   hydrocortisone (ANUSOL-HC) 2.5 % rectal cream Place 1 application rectally 2 (two) times daily.   hyoscyamine (LEVSIN) 0.125 MG tablet Take 0.125 mg by mouth every 4 (four) hours as needed.   lisinopril (ZESTRIL) 40 MG tablet Take 1 tablet (40 mg total) by mouth daily.   metoprolol succinate (TOPROL-XL) 25 MG 24 hr tablet Take 1 tablet (25 mg total) by mouth daily.   nitroGLYCERIN (NITROSTAT) 0.4 MG SL tablet Place 1 tablet (0.4 mg total) under the tongue every 5 (five) minutes as needed for chest pain.   omeprazole (PRILOSEC) 20 MG capsule TAKE 1 CAPSULE BY MOUTH EVERY DAY   sertraline (ZOLOFT) 50 MG tablet Take 1 tablet (50 mg total) by mouth daily.   VITAMIN D PO Take 2,000 Units by mouth daily.       01/31/2022    2:19 PM 01/23/2022    4:08 PM 10/26/2021    9:40 AM 10/08/2021    8:18 AM  GAD 7 : Generalized Anxiety Score  Nervous, Anxious, on Edge 0 0 0 0  Control/stop worrying 0 0 0 0  Worry too much - different things 0 0 0 0  Trouble relaxing 0 0 0 0  Restless 0 0 0 0  Easily annoyed or irritable 0 0 0 0  Afraid - awful might happen 0 0 0 0  Total GAD 7 Score 0 0 0 0  Anxiety Difficulty Not difficult at all Not difficult at all Not difficult at all        01/31/2022    2:19 PM 01/23/2022    4:08 PM 10/26/2021    9:40 AM  Depression screen PHQ 2/9  Decreased Interest 0 0 0  Down, Depressed, Hopeless 0 0 0  PHQ - 2 Score 0 0 0  Altered sleeping 0 0 0  Tired, decreased energy 3 0 0  Change in appetite 0 0 0  Feeling bad or failure about yourself  0 0 0  Trouble concentrating 0 0 0  Moving slowly or fidgety/restless 0 0 0  Suicidal thoughts 0 0 0  PHQ-9 Score 3 0 0  Difficult doing work/chores Not difficult at all Not difficult at all Not difficult at all    BP Readings from Last 3 Encounters:  01/31/22 132/68  01/23/22 126/72  10/26/21 (!) 150/86    Physical Exam HENT:     Head: Normocephalic.      Right Ear: Tympanic membrane normal.     Left Ear: Tympanic membrane normal.     Nose: Nose normal.     Mouth/Throat:     Mouth: Mucous membranes are moist.  Eyes:     Extraocular Movements: Extraocular movements intact.     Pupils: Pupils are equal, round, and reactive to light.  Pulmonary:     Breath sounds: Wheezing present. No decreased breath sounds, rhonchi or rales.  Comments: Increased expiratory/inspiratory ratio Musculoskeletal:     Cervical back: Normal range of motion.  Neurological:     Mental Status: She is alert.     Wt Readings from Last 3 Encounters:  01/31/22 153 lb (69.4 kg)  01/23/22 157 lb (71.2 kg)  10/26/21 160 lb (72.6 kg)    BP 132/68   Pulse 86   Ht 5' 4" (1.626 m)   Wt 153 lb (69.4 kg)   SpO2 96%   BMI 26.26 kg/m   Assessment and Plan: 1. Asthma exacerbation, mild Intermittent.  Currently active.  Improved but persistent.  Patient continues to have wheezing with tightness consistent with exacerbation of her asthma.  Examination notes minimal wheezing but air movement is good but there is an increase in expiratory to inspiratory ratio.  Since the patient had azithromycin she still got what seems to be 3 more days of circulating antibiotic in her system.  We will resume prednisone but had a not as high dosing of 40 mg but taper over a slower period of time over 12 days rather than 6 we will add an Pratropium by means of DuoNeb and substitute that for the albuterol inhaler and use nebulization since she is has a nebulizer at home every 6 hours.  Patient has been given of Breztri to hold onto which may have been be substituted for her Advair in the future.  We have discussed the possibility of having pulmonary involved but we will obtain a chest x-ray first and we will review that prior to this decision. - predniSONE (DELTASONE) 10 MG tablet; Take 1 tablet (10 mg total) by mouth daily with breakfast. Taper 4,4,4,3,3,3,2,2,2,1,1,1  Dispense: 30 tablet;  Refill: 0 - DG Chest 2 View - ipratropium-albuterol (DUONEB) 0.5-2.5 (3) MG/3ML SOLN; Take 3 mLs by nebulization every 6 (six) hours as needed.  Dispense: 360 mL; Refill: 1       Otilio Miu, MD

## 2022-01-31 NOTE — Telephone Encounter (Signed)
  Chief Complaint: asthma fu Symptoms: Sob, wheezing, coughing, feeling clammy Frequency: ongoing for few weeks now but getting worse  Pertinent Negatives: NA Disposition: [] ED /[] Urgent Care (no appt availability in office) / [x] Appointment(In office/virtual)/ []  [facility]/ [] Home Care/ [] Refused Recommended Disposition /[] [place] [facility]/ []  Follow-up with PCP Additional Notes: pt had OV on 01/23/22 with Dr. , has tried the medications she suggested and not getting any better. Today she had asthma flare up and left work d/t feeling clammy and wheezing and SOB. She used albuterol inhaler and got some relief but pt is requesting to come in today and see anyone who is available. Advised no appts with Dr. but she was ok with scheduling appt with Dr. at 1400. Advised pt if she needed to use inhaler for SOB as needed.   Reason for Disposition  [1] Continuous (nonstop) coughing AND [2] keeps from working or sleeping AND [3] not improved after 2 or 3 inhaler or nebulizer treatments given 20 minutes apart  Answer Assessment - Initial Assessment Questions 1. RESPIRATORY STATUS: "Describe your breathing?" (e.g., wheezing, shortness of breath, unable to speak, severe coughing)      SOB, wheezing 2. ONSET: "When did this asthma attack begin?"      Ongoing since last OV on 01/23/22 3. TRIGGER: "What do you think triggered this attack?" (e.g., URI, exposure to pollen or other allergen, tobacco smoke)      unsure 5. SEVERITY: "How bad is this attack?"    - MILD: No SOB at rest, mild SOB with walking, speaks normally in sentences, can lie down, no retractions, pulse < 100. (GREEN Zone: PEFR 80-100%)   - MODERATE: SOB at rest, SOB with minimal exertion and prefers to sit, cannot lie down flat, speaks in phrases, mild retractions, audible wheezing, pulse 100-120. (YELLOW Zone: PEFR 50-79%)    - SEVERE: Struggling for each breath, speaks in single words, struggling to  breathe, sitting hunched forward, retractions, usually loud wheezing, sometimes minimal wheezing because of decreased air movement, pulse > 120. (RED Zone: PEFR < 50%).      Mild to moderate at times 7. INHALED QUICK-RELIEF TREATMENTS FOR THIS ATTACK: "What treatments have you given yourself so far?" and "How many and how often?" If using an inhaler, ask, "How many puffs?" Note: Routine treatments are 2 puffs every 4 hours as needed. Rescue treatments are 4 puffs repeated every 20 minutes, up to three times as needed.      Used albuterol inhaler and temporary relief 8. OTHER SYMPTOMS: "Do you have any other symptoms? (e.g., chest pain, coughing up yellow sputum, fever, runny nose)     Clammy and coughing  Protocols used: Asthma Attack-A-AH

## 2022-02-04 NOTE — Telephone Encounter (Signed)
Refusing refill due to discontinued on 09/12/21 by Reather Littler, LPN at [facility].    Requested Prescriptions  Refused Prescriptions Disp Refills  . promethazine-dextromethorphan (PROMETHAZINE-DM) 6.25-15 MG/5ML syrup [Pharmacy Med Name: PROMETHAZINE-DM 6.25-15 MG/5ML] 180 mL 0    Sig: TAKE 5 MLS BY MOUTH 4 TIMES A DAY AS NEEDED FOR COUGH     Ear, Nose, and Throat:  Antitussives/Expectorants Passed - 01/31/2022  9:46 PM      Passed - Valid encounter within last 12 months    Recent Outpatient Visits          4 days ago Asthma exacerbation, mild   [place] [facility], [facility]   1 week ago Asthma exacerbation, mild   [place] [facility] at [facility], Nyoka Cowden, MD   3 months ago Chest pain, unspecified type   [facility] at [facility], Nyoka Cowden, MD   3 months ago Annual physical exam   [facility] at [facility], Nyoka Cowden, MD   5 months ago Asthma exacerbation, mild   [place] [facility] at [facility], Nyoka Cowden, MD      Future Appointments            In 1 month Judithann Graves, Nyoka Cowden, MD [facility] at [facility], [facility]   In 8 months Judithann Graves, Nyoka Cowden, MD [facility] at [facility], [facility]

## 2022-02-11 DIAGNOSIS — E782 Mixed hyperlipidemia: Secondary | ICD-10-CM | POA: Diagnosis not present

## 2022-02-11 DIAGNOSIS — I10 Essential (primary) hypertension: Secondary | ICD-10-CM | POA: Diagnosis not present

## 2022-02-11 DIAGNOSIS — J4531 Mild persistent asthma with (acute) exacerbation: Secondary | ICD-10-CM | POA: Diagnosis not present

## 2022-02-14 ENCOUNTER — Other Ambulatory Visit: Payer: Self-pay

## 2022-02-14 ENCOUNTER — Telehealth: Payer: Self-pay | Admitting: Internal Medicine

## 2022-02-14 ENCOUNTER — Telehealth: Payer: Self-pay

## 2022-02-14 MED ORDER — BREZTRI AEROSPHERE 160-9-4.8 MCG/ACT IN AERO: 2.0000 | INHALATION_SPRAY | Freq: Two times a day (BID) | RESPIRATORY_TRACT | 5 refills | Status: DC

## 2022-02-14 NOTE — Telephone Encounter (Signed)
Completed PA on [email or web address] for Breztri 160-9-4.8MCG.  PA states "available without authorization."

## 2022-02-14 NOTE — Telephone Encounter (Signed)
Spoke with pt. Told her we can send to her pharmacy and I will complete a PA for inhaler.  She verbalized understanding.  - Sarah Hernandez

## 2022-02-14 NOTE — Telephone Encounter (Signed)
Copied from [place] [id number]. Topic: General - Inquiry >> Feb 14, 2022  8:15 AM Chapman Fitch wrote: Reason for CRM: pt was advised when she runs out of her sample inhaler to call to get another/ please advise if she can pick up other sample tomorrow at 8am on the way to work  / please advise

## 2022-02-19 ENCOUNTER — Other Ambulatory Visit (HOSPITAL_COMMUNITY): Payer: Self-pay | Admitting: Student

## 2022-02-19 ENCOUNTER — Other Ambulatory Visit: Payer: Self-pay | Admitting: Student

## 2022-02-19 DIAGNOSIS — I10 Essential (primary) hypertension: Secondary | ICD-10-CM

## 2022-02-25 ENCOUNTER — Ambulatory Visit
Admission: RE | Admit: 2022-02-25 | Discharge: 2022-02-25 | Disposition: A | Discharge status: Home / Self Care | Payer: Medicare PPO | Source: Ambulatory Visit | Attending: Student | Admitting: Student

## 2022-02-25 DIAGNOSIS — I10 Essential (primary) hypertension: Secondary | ICD-10-CM | POA: Diagnosis not present

## 2022-02-27 ENCOUNTER — Other Ambulatory Visit: Payer: Self-pay | Admitting: Internal Medicine

## 2022-02-27 DIAGNOSIS — E782 Mixed hyperlipidemia: Secondary | ICD-10-CM

## 2022-02-28 MED ORDER — ATORVASTATIN CALCIUM 40 MG PO TABS: 40.0000 mg | ORAL_TABLET | Freq: Every day | ORAL | 0 refills | Status: DC

## 2022-03-15 NOTE — Progress Notes (Signed)
Renal ultrasound ordered due to poorly controlled blood pressure and to rule out renal artery stenosis. Please see all notes from [facility] cardiology in EMR.

## 2022-03-26 ENCOUNTER — Ambulatory Visit: Payer: Medicare PPO | Admitting: Internal Medicine

## 2022-05-30 ENCOUNTER — Other Ambulatory Visit: Payer: Self-pay | Admitting: Internal Medicine

## 2022-05-30 DIAGNOSIS — E782 Mixed hyperlipidemia: Secondary | ICD-10-CM

## 2022-05-31 NOTE — Telephone Encounter (Signed)
Requested Prescriptions  Pending Prescriptions Disp Refills   atorvastatin (LIPITOR) 40 MG tablet [Pharmacy Med Name: ATORVASTATIN 40 MG TABLET] 90 tablet 0    Sig: TAKE 1 TABLET BY MOUTH EVERY DAY     Cardiovascular:  Antilipid - Statins Failed - 05/30/2022  7:29 PM      Failed - Lipid Panel in normal range within the last 12 months    Cholesterol, Total  Date Value Ref Range Status  10/08/2021 162 100 - 199 mg/dL Final   LDL Chol Calc (NIH)  Date Value Ref Range Status  10/08/2021 89 0 - 99 mg/dL Final   HDL  Date Value Ref Range Status  10/08/2021 53 >39 mg/dL Final   Triglycerides  Date Value Ref Range Status  10/08/2021 112 0 - 149 mg/dL Final         Passed - Patient is not pregnant      Passed - Valid encounter within last 12 months    Recent Outpatient Visits           4 months ago Asthma exacerbation, mild   [place] [facility] at [place], [place], MD   4 months ago Asthma exacerbation, mild   [place] [facility] at [facility], Jesse Sans, MD   7 months ago Chest pain, unspecified type   [facility] at [facility], Jesse Sans, MD   7 months ago Annual physical exam   [facility] at [facility], Jesse Sans, MD   9 months ago Asthma exacerbation, mild   [place] [facility] at [facility], Jesse Sans, MD       Future Appointments             In 4 months Army Melia, Jesse Sans, MD [place] and Sports Medicine at [facility], [facility]

## 2022-08-15 ENCOUNTER — Other Ambulatory Visit: Payer: Self-pay | Admitting: Internal Medicine

## 2022-08-15 DIAGNOSIS — K2100 Gastro-esophageal reflux disease with esophagitis, without bleeding: Secondary | ICD-10-CM

## 2022-08-15 DIAGNOSIS — E782 Mixed hyperlipidemia: Secondary | ICD-10-CM

## 2022-08-16 NOTE — Telephone Encounter (Signed)
Requested Prescriptions  Pending Prescriptions Disp Refills   omeprazole (PRILOSEC) 20 MG capsule [Pharmacy Med Name: OMEPRAZOLE DR 20 MG CAPSULE] 90 capsule 3    Sig: TAKE 1 CAPSULE BY MOUTH EVERY DAY     Gastroenterology: Proton Pump Inhibitors Passed - 08/15/2022  6:29 PM      Passed - Valid encounter within last 12 months    Recent Outpatient Visits           6 months ago Asthma exacerbation, mild   [place] [facility] at [place], [place], MD   6 months ago Asthma exacerbation, mild   [place] [facility] at [facility], Jesse Sans, MD   9 months ago Chest pain, unspecified type   [facility] [place] at [facility], Jesse Sans, MD   10 months ago Annual physical exam   [place] at [facility], Jesse Sans, MD   11 months ago Asthma exacerbation, mild   [place] at [facility], Jesse Sans, MD       Future Appointments             In 1 month Glean Hess, MD [facility] Primary [place] at [place], [place]             atorvastatin (LIPITOR) 40 MG tablet [Pharmacy Med Name: ATORVASTATIN 40 MG TABLET] 90 tablet 0    Sig: TAKE 1 TABLET BY MOUTH EVERY DAY     Cardiovascular:  Antilipid - Statins Failed - 08/15/2022  6:29 PM      Failed - Lipid Panel in normal range within the last 12 months    Cholesterol, Total  Date Value Ref Range Status  10/08/2021 162 100 - 199 mg/dL Final   LDL Chol Calc (NIH)  Date Value Ref Range Status  10/08/2021 89 0 - 99 mg/dL Final   HDL  Date Value Ref Range Status  10/08/2021 53 >39 mg/dL Final   Triglycerides  Date Value Ref Range Status  10/08/2021 112 0 - 149 mg/dL Final         Passed - Patient is not pregnant      Passed - Valid encounter within last 12 months    Recent Outpatient Visits           6 months  ago Asthma exacerbation, mild   [place] [facility] [place] at [place], Deanna C, MD   6 months ago Asthma exacerbation, mild   [place] [facility] [place] at [facility], Jesse Sans, MD   9 months ago Chest pain, unspecified type   [facility] [place] at [facility], Jesse Sans, MD   10 months ago Annual physical exam   [place] at [facility], Jesse Sans, MD   11 months ago Asthma exacerbation, mild   [place] at [facility], Jesse Sans, MD       Future Appointments             In 1 month Army Melia, Jesse Sans, MD [place] at [facility], [facility]

## 2022-09-16 ENCOUNTER — Other Ambulatory Visit: Payer: Self-pay | Admitting: Internal Medicine

## 2022-09-16 DIAGNOSIS — F411 Generalized anxiety disorder: Secondary | ICD-10-CM

## 2022-09-18 ENCOUNTER — Ambulatory Visit (INDEPENDENT_AMBULATORY_CARE_PROVIDER_SITE_OTHER): Payer: Medicare PPO

## 2022-09-18 VITALS — BP 152/90 | Ht 64.0 in | Wt 156.2 lb

## 2022-09-18 DIAGNOSIS — Z0000 Encounter for general adult medical examination without abnormal findings: Secondary | ICD-10-CM | POA: Diagnosis not present

## 2022-09-18 NOTE — Progress Notes (Addendum)
Subjective:   Sarah Hernandez is a 72 y.o. female who presents for Medicare Annual (Subsequent) preventive examination.  Review of Systems     Cardiac Risk Factors include: advanced age (>69men, >54 women);dyslipidemia;hypertension     Objective:    Today's Vitals   09/18/22 0807  BP: (!) 152/90  Weight: 156 lb 3.2 oz (70.9 kg)  Height:  (1.626 m)   Body mass index is 26.81 kg/m.     09/18/2022    8:19 AM 09/12/2021    8:31 AM 09/11/2020    8:35 AM 09/08/2019    8:22 AM 07/09/2019    1:12 PM 09/07/2018    9:37 AM 05/05/2018    1:12 PM  Advanced Directives  Does Patient Have a Medical Advance Directive? No Yes Yes Yes Yes Yes Yes  Type of Furniture conservator/restorer;Living will Healthcare Power of [place];Living will Healthcare Power of [place];Living will Healthcare Power of [place];Living will Healthcare Power of [address];Living will Healthcare Power of [place];Living will;Out of facility DNR (pink MOST or yellow form)  Copy of Healthcare Power of Attorney in Chart?  No - copy requested No - copy requested No - copy requested  No - copy requested No - copy requested  Would patient like information on creating a medical advance directive? No - Patient declined          Current Medications (verified) Outpatient Encounter Medications as of 09/18/2022  Medication Sig   albuterol (PROVENTIL) (2.5 MG/3ML) 0.083% nebulizer solution Inhale 3 mLs into the lungs 4 (four) times daily as needed. Reported on 08/16/2015   albuterol (VENTOLIN HFA) 108 (90 Base) MCG/ACT inhaler TAKE 2 PUFFS BY MOUTH EVERY 6 HOURS AS NEEDED FOR WHEEZE OR SHORTNESS OF BREATH   atorvastatin (LIPITOR) 40 MG tablet TAKE 1 TABLET BY MOUTH EVERY DAY   Budeson-Glycopyrrol-Formoterol (BREZTRI AEROSPHERE) 160-9-4.8 MCG/ACT AERO Inhale 2 puffs into the lungs 2 (two) times daily.   Cyanocobalamin (VITAMIN B-12 PO) Place 1 drop under the tongue daily.   fluticasone-salmeterol (ADVAIR HFA)  115-21 MCG/ACT inhaler Inhale 2 puffs into the lungs 2 (two) times daily.   hydrocortisone (ANUSOL-HC) 2.5 % rectal cream Place 1 application rectally 2 (two) times daily.   hyoscyamine (LEVSIN) 0.125 MG tablet Take 0.125 mg by mouth every 4 (four) hours as needed.   ipratropium-albuterol (DUONEB) 0.5-2.5 (3) MG/3ML SOLN Take 3 mLs by nebulization every 6 (six) hours as needed.   lisinopril (ZESTRIL) 40 MG tablet Take 1 tablet (40 mg total) by mouth daily.   metoprolol succinate (TOPROL-XL) 25 MG 24 hr tablet Take 1 tablet (25 mg total) by mouth daily.   omeprazole (PRILOSEC) 20 MG capsule TAKE 1 CAPSULE BY MOUTH EVERY DAY   sertraline (ZOLOFT) 50 MG tablet TAKE 1 TABLET BY MOUTH EVERY DAY   VITAMIN D PO Take 2,000 Units by mouth daily.   chlorthalidone (HYGROTON) 25 MG tablet Take 12.5 mg by mouth daily.   nitroGLYCERIN (NITROSTAT) 0.4 MG SL tablet Place 1 tablet (0.4 mg total) under the tongue every 5 (five) minutes as needed for chest pain.   [DISCONTINUED] predniSONE (DELTASONE) 10 MG tablet Take 1 tablet (10 mg total) by mouth daily with breakfast. Taper 4,4,4,3,3,3,2,2,2,1,1,1   No facility-administered encounter medications on file as of 09/18/2022.    Allergies (verified) Flagyl [metronidazole]   History: Past Medical History:  Diagnosis Date   Allergy Seasonal   Anxiety Meds   Asthma Meds as needed   Diverticulosis  GERD (gastroesophageal reflux disease)    History of basal cell cancer 09/13/2019   Hyperlipidemia    Hypertension    Irritable bowel    Overactive bladder    Past Surgical History:  Procedure Laterality Date   ABDOMINAL HYSTERECTOMY  1997   APPENDECTOMY  1997   COLONOSCOPY  2015   diverticulosis   PARTIAL HYSTERECTOMY  1979   bleeding from fibroids   TONSILLECTOMY  1966   UPPER GASTROINTESTINAL ENDOSCOPY  09/2014   gastritis/esoph; no Barrett's   Family History  Problem Relation Age of Onset   Dementia Mother    Varicose Veins Mother    CAD Father     Heart disease Father    CAD Brother    Stroke Brother    Heart disease Son    Social History   Socioeconomic History   Marital status: Married    Spouse name: Jeronimo Greaves   Number of children: 2   Years of education: 12+   Highest education level: Some college, no degree  Occupational History   Occupation: part time    Comment: Optician, dispensing  Tobacco Use   Smoking status: Never   Smokeless tobacco: Never  Vaping Use   Vaping Use: Never used  Substance and Sexual Activity   Alcohol use: Yes    Alcohol/week: 5.0 standard drinks of alcohol    Types: 5 Shots of liquor per week    Comment: Occasionally   Drug use: Not Currently   Sexual activity: Yes    Partners: Male  Other Topics Concern   Not on file  Social History Narrative   Not on file   Social Determinants of Health   Financial Resource Strain: Low Risk  (09/18/2022)   Overall Financial Resource Strain (CARDIA)    Difficulty of Paying Living Expenses: Not hard at all  Food Insecurity: No Food Insecurity (09/18/2022)   Hunger Vital Sign    Worried About Running Out of Food in the Last Year: Never true    Ran Out of Food in the Last Year: Never true  Transportation Needs: No Transportation Needs (09/18/2022)   PRAPARE - Administrator, Civil Service (Medical): No    Lack of Transportation (Non-Medical): No  Physical Activity: Sufficiently Active (09/18/2022)   Exercise Vital Sign    Days of Exercise per Week: 7 days    Minutes of Exercise per Session: 40 min  Stress: No Stress Concern Present (09/18/2022)   [employer] of Occupational Health - Occupational Stress Questionnaire    Feeling of Stress : Not at all  Social Connections: Moderately Integrated (09/18/2022)   Social Connection and Isolation Panel [NHANES]    Frequency of Communication with Friends and Family: More than three times a week    Frequency of Social Gatherings with Friends and Family: More than three times a week     Attends Religious Services: Never    Database administrator or Organizations: Yes    Attends Engineer, structural: More than 4 times per year    Marital Status: Married    Tobacco Counseling Counseling given: Not Answered   Clinical Intake:  Pre-visit preparation completed: Yes  Pain : No/denies pain     Diabetes: No  How often do you need to have someone help you when you read instructions, pamphlets, or other written materials from your doctor or pharmacy?: 1 - Never  Diabetic?no  Interpreter Needed?: No  Information entered by :: Kennedy Bucker, LPN  Activities of Daily Living    09/18/2022    8:20 AM 09/12/2022    7:19 AM  In your present state of health, do you have any difficulty performing the following activities:  Hearing? 0 0  Vision? 0 0  Difficulty concentrating or making decisions? 0 0  Walking or climbing stairs? 0 0  Dressing or bathing? 0 0  Doing errands, shopping? 0 0  Preparing Food and eating ? N N  Using the Toilet? N N  In the past six months, have you accidently leaked urine? Y Y  Do you have problems with loss of bowel control? N N  Managing your Medications? N N  Managing your Finances? N N  Housekeeping or managing your Housekeeping? N N    Patient Care Team: Reubin Milan, MD as PCP - General (Internal Medicine) Alwyn Pea, MD as Consulting Physician (Cardiology)  Indicate any recent Medical Services you may have received from other than [facility] providers in the past year (date may be approximate).     Assessment:   This is a routine wellness examination for [place].  Hearing/Vision screen Hearing Screening - Comments:: No aids Vision Screening - Comments:: Contacts, glasses- [facility]   Dietary issues and exercise activities discussed: Current Exercise Habits: Home exercise routine, Type of exercise: walking, Time (Minutes): 45, Frequency (Times/Week): 7, Weekly Exercise (Minutes/Week): 315, Intensity:  Mild   Goals Addressed             This Visit's Progress    DIET - EAT MORE FRUITS AND VEGETABLES         Depression Screen    09/18/2022    8:17 AM 01/31/2022    2:19 PM 01/23/2022    4:08 PM 10/26/2021    9:40 AM 10/08/2021    8:18 AM 09/12/2021    8:31 AM 08/22/2021    2:44 PM  PHQ 2/9 Scores  PHQ - 2 Score 0 0 0 0 0 0 0  PHQ- 9 Score 0 3 0 0 0 0 0    Fall Risk    09/18/2022    8:19 AM 09/12/2022    7:19 AM 01/31/2022    2:19 PM 01/23/2022    4:08 PM 10/26/2021    9:40 AM  Fall Risk   Falls in the past year? 0 0 0 0 0  Number falls in past yr: 0 0 0 0 0  Injury with Fall? 0 0 0 0 0  Risk for fall due to : No Fall Risks  No Fall Risks No Fall Risks No Fall Risks  Follow up Falls prevention discussed;Falls evaluation completed  Falls evaluation completed Falls evaluation completed Falls evaluation completed    FALL RISK PREVENTION PERTAINING TO THE HOME:  Any stairs in or around the home? Yes  If so, are there any without handrails? No  Home free of loose throw rugs in walkways, pet beds, electrical cords, etc? Yes  Adequate lighting in your home to reduce risk of falls? Yes   ASSISTIVE DEVICES UTILIZED TO PREVENT FALLS:  Life alert? No  Use of a cane, walker or w/c? No  Grab bars in the bathroom? No  Shower chair or bench in shower? No  Elevated toilet seat or a handicapped toilet? No   TIMED UP AND GO:  Was the test performed? Yes .  Length of time to ambulate 10 feet: 4 sec.   Gait steady and fast without use of assistive device  Cognitive Function:  09/18/2022    8:25 AM 09/08/2019    8:26 AM 09/07/2018    9:41 AM 08/27/2017    8:26 AM 08/21/2016    8:37 AM  6CIT Screen  What Year? 0 points 0 points 0 points 0 points 0 points  What month? 0 points 0 points 0 points 0 points 0 points  What time? 0 points 0 points 0 points 0 points 0 points  Count back from 20 0 points 0 points 0 points 0 points 0 points  Months in reverse 0 points 0 points 0 points  0 points 0 points  Repeat phrase 0 points 0 points 0 points 0 points 0 points  Total Score 0 points 0 points 0 points 0 points 0 points    Immunizations Immunization History  Administered Date(s) Administered   Fluad Quad(high Dose 65+) 01/20/2019, 02/16/2020, 01/14/2022   Influenza, High Dose Seasonal PF 02/02/2018, 02/11/2021   Influenza,inj,Quad PF,6+ Mos 03/08/2015, 02/14/2016, 01/28/2017   Influenza-Unspecified 03/08/2015, 02/14/2016, 01/28/2017   PFIZER Comirnaty(Gray Top)Covid-19 Tri-Sucrose Vaccine 03/08/2020, 09/20/2020   PFIZER(Purple Top)SARS-COV-2 Vaccination 07/17/2019, 08/08/2019   PNEUMOCOCCAL CONJUGATE-20 09/07/2022   Pfizer Covid-19 Vaccine Bivalent Booster 19yrs & up 02/11/2021, 10/28/2021   Pneumococcal Conjugate-13 08/10/2014   Pneumococcal Polysaccharide-23 06/15/2013   Tdap 04/15/2007, 05/28/2008, 10/28/2018   Zoster Recombinat (Shingrix) 03/07/2019, 05/07/2019   Zoster, Live 05/28/2012    TDAP status: Up to date  Flu Vaccine status: Up to date  Pneumococcal vaccine status: Up to date  Covid-19 vaccine status: Completed vaccines  Qualifies for Shingles Vaccine? Yes   Zostavax completed Yes   Shingrix Completed?: Yes  Screening Tests Health Maintenance  Topic Date Due   COVID-19 Vaccine (7 - 2023-24 season) 01/25/2022   MAMMOGRAM  11/15/2022   INFLUENZA VACCINE  12/26/2022   Medicare Annual Wellness (AWV)  09/18/2023   COLONOSCOPY (Pts 45-77yrs Insurance coverage will need to be confirmed)  10/20/2023   DTaP/Tdap/Td (4 - Td or Tdap) 10/27/2028   Pneumonia Vaccine 77+ Years old  Completed   DEXA SCAN  Completed   Hepatitis C Screening  Completed   Zoster Vaccines- Shingrix  Completed   HPV VACCINES  Aged Out    Health Maintenance  Health Maintenance Due  Topic Date Due   COVID-19 Vaccine (7 - 2023-24 season) 01/25/2022    Colorectal cancer screening: Type of screening: Colonoscopy. Completed 10/19/13. Repeat every 10 years  Mammogram  status: Completed 11/14/21, has one scheduled this June 2024 . Repeat every year  Bone Density status: Completed 11/14/21. Results reflect: Bone density results: NORMAL. Repeat every 5 years.  Lung Cancer Screening: (Low Dose CT Chest recommended if Age 30-80 years, 30 pack-year currently smoking OR have quit w/in 15years.) does not qualify.   Additional Screening:  Hepatitis C Screening: does qualify; Completed 08/16/15  Vision Screening: Recommended annual ophthalmology exams for early detection of glaucoma and other disorders of the eye. Is the patient up to date with their annual eye exam?  Yes  Who is the provider or what is the name of the office in which the patient attends annual eye exams? [facility] If pt is not established with a provider, would they like to be referred to a provider to establish care? No .   Dental Screening: Recommended annual dental exams for proper oral hygiene  Community Resource Referral / Chronic Care Management: CRR required this visit?  No   CCM required this visit?  No      Plan:     I have  personally reviewed and noted the following in the patient's chart:   Medical and social history Use of alcohol, tobacco or illicit drugs  Current medications and supplements including opioid prescriptions. Patient is not currently taking opioid prescriptions. Functional ability and status Nutritional status Physical activity Advanced directives List of other physicians Hospitalizations, surgeries, and ER visits in previous 12 months Vitals Screenings to include cognitive, depression, and falls Referrals and appointments  In addition, I have reviewed and discussed with patient certain preventive protocols, quality metrics, and best practice recommendations. A written personalized care plan for preventive services as well as general preventive health recommendations were provided to patient.     Hal Hope, LPN   1/61/0960   Nurse Notes:  none

## 2022-09-18 NOTE — Patient Instructions (Signed)
Ms. Sarah Hernandez , Thank you for taking time to come for your Medicare Wellness Visit. I appreciate your ongoing commitment to your health goals. Please review the following plan we discussed and let me know if I can assist you in the future.   These are the goals we discussed:  Goals      DIET - EAT MORE FRUITS AND VEGETABLES     DIET - INCREASE WATER INTAKE     Recommend to drink at least 6-8 8oz glasses of water per day.        This is a list of the screening recommended for you and due dates:  Health Maintenance  Topic Date Due   COVID-19 Vaccine (7 - 2023-24 season) 01/25/2022   Mammogram  11/15/2022   Flu Shot  12/26/2022   Medicare Annual Wellness Visit  09/18/2023   Colon Cancer Screening  10/20/2023   DTaP/Tdap/Td vaccine (4 - Td or Tdap) 10/27/2028   Pneumonia Vaccine  Completed   DEXA scan (bone density measurement)  Completed   Hepatitis C Screening: USPSTF Recommendation to screen - Ages 28-79 yo.  Completed   Zoster (Shingles) Vaccine  Completed   HPV Vaccine  Aged Out    Advanced directives: no  Conditions/risks identified: none  Next appointment: Follow up in one year for your annual wellness visit 09/24/23 @ 8:15 am in person   Preventive Care 65 Years and Older, Female Preventive care refers to lifestyle choices and visits with your health care provider that can promote health and wellness. What does preventive care include? A yearly physical exam. This is also called an annual well check. Dental exams once or twice a year. Routine eye exams. Ask your health care provider how often you should have your eyes checked. Personal lifestyle choices, including: Daily care of your teeth and gums. Regular physical activity. Eating a healthy diet. Avoiding tobacco and drug use. Limiting alcohol use. Practicing safe sex. Taking low-dose aspirin every day. Taking vitamin and mineral supplements as recommended by your health care provider. What happens during an  annual well check? The services and screenings done by your health care provider during your annual well check will depend on your age, overall health, lifestyle risk factors, and family history of disease. Counseling  Your health care provider may ask you questions about your: Alcohol use. Tobacco use. Drug use. Emotional well-being. Home and relationship well-being. Sexual activity. Eating habits. History of falls. Memory and ability to understand (cognition). Work and work Astronomer. Reproductive health. Screening  You may have the following tests or measurements: Height, weight, and BMI. Blood pressure. Lipid and cholesterol levels. These may be checked every 5 years, or more frequently if you are over 56 years old. Skin check. Lung cancer screening. You may have this screening every year starting at age 62 if you have a 30-pack-year history of smoking and currently smoke or have quit within the past 15 years. Fecal occult blood test (FOBT) of the stool. You may have this test every year starting at age 15. Flexible sigmoidoscopy or colonoscopy. You may have a sigmoidoscopy every 5 years or a colonoscopy every 10 years starting at age 58. Hepatitis C blood test. Hepatitis B blood test. Sexually transmitted disease (STD) testing. Diabetes screening. This is done by checking your blood sugar (glucose) after you have not eaten for a while (fasting). You may have this done every 1-3 years. Bone density scan. This is done to screen for osteoporosis. You may have this done starting  at age 8. Mammogram. This may be done every 1-2 years. Talk to your health care provider about how often you should have regular mammograms. Talk with your health care provider about your test results, treatment options, and if necessary, the need for more tests. Vaccines  Your health care provider may recommend certain vaccines, such as: Influenza vaccine. This is recommended every year. Tetanus,  diphtheria, and acellular pertussis (Tdap, Td) vaccine. You may need a Td booster every 10 years. Zoster vaccine. You may need this after age 22. Pneumococcal 13-valent conjugate (PCV13) vaccine. One dose is recommended after age 23. Pneumococcal polysaccharide (PPSV23) vaccine. One dose is recommended after age 57. Talk to your health care provider about which screenings and vaccines you need and how often you need them. This information is not intended to replace advice given to you by your health care provider. Make sure you discuss any questions you have with your health care provider. Document Released: 06/09/2015 Document Revised: 01/31/2016 Document Reviewed: 03/14/2015 Elsevier Interactive Patient Education  2017 [place] Prevention in the Home Falls can cause injuries. They can happen to people of all ages. There are many things you can do to make your home safe and to help prevent falls. What can I do on the outside of my home? Regularly fix the edges of walkways and driveways and fix any cracks. Remove anything that might make you trip as you walk through a door, such as a raised step or threshold. Trim any bushes or trees on the path to your home. Use bright outdoor lighting. Clear any walking paths of anything that might make someone trip, such as rocks or tools. Regularly check to see if handrails are loose or broken. Make sure that both sides of any steps have handrails. Any raised decks and porches should have guardrails on the edges. Have any leaves, snow, or ice cleared regularly. Use sand or salt on walking paths during winter. Clean up any spills in your garage right away. This includes oil or grease spills. What can I do in the bathroom? Use night lights. Install grab bars by the toilet and in the tub and shower. Do not use towel bars as grab bars. Use non-skid mats or decals in the tub or shower. If you need to sit down in the shower, use a plastic,  non-slip stool. Keep the floor dry. Clean up any water that spills on the floor as soon as it happens. Remove soap buildup in the tub or shower regularly. Attach bath mats securely with double-sided non-slip rug tape. Do not have throw rugs and other things on the floor that can make you trip. What can I do in the bedroom? Use night lights. Make sure that you have a light by your bed that is easy to reach. Do not use any sheets or blankets that are too big for your bed. They should not hang down onto the floor. Have a firm chair that has side arms. You can use this for support while you get dressed. Do not have throw rugs and other things on the floor that can make you trip. What can I do in the kitchen? Clean up any spills right away. Avoid walking on wet floors. Keep items that you use a lot in easy-to-reach places. If you need to reach something above you, use a strong step stool that has a grab bar. Keep electrical cords out of the way. Do not use floor polish or wax that makes  floors slippery. If you must use wax, use non-skid floor wax. Do not have throw rugs and other things on the floor that can make you trip. What can I do with my stairs? Do not leave any items on the stairs. Make sure that there are handrails on both sides of the stairs and use them. Fix handrails that are broken or loose. Make sure that handrails are as long as the stairways. Check any carpeting to make sure that it is firmly attached to the stairs. Fix any carpet that is loose or worn. Avoid having throw rugs at the top or bottom of the stairs. If you do have throw rugs, attach them to the floor with carpet tape. Make sure that you have a light switch at the top of the stairs and the bottom of the stairs. If you do not have them, ask someone to add them for you. What else can I do to help prevent falls? Wear shoes that: Do not have high heels. Have rubber bottoms. Are comfortable and fit you well. Are closed  at the toe. Do not wear sandals. If you use a stepladder: Make sure that it is fully opened. Do not climb a closed stepladder. Make sure that both sides of the stepladder are locked into place. Ask someone to hold it for you, if possible. Clearly mark and make sure that you can see: Any grab bars or handrails. First and last steps. Where the edge of each step is. Use tools that help you move around (mobility aids) if they are needed. These include: Canes. Walkers. Scooters. Crutches. Turn on the lights when you go into a dark area. Replace any light bulbs as soon as they burn out. Set up your furniture so you have a clear path. Avoid moving your furniture around. If any of your floors are uneven, fix them. If there are any pets around you, be aware of where they are. Review your medicines with your doctor. Some medicines can make you feel dizzy. This can increase your chance of falling. Ask your doctor what other things that you can do to help prevent falls. This information is not intended to replace advice given to you by your health care provider. Make sure you discuss any questions you have with your health care provider. Document Released: 03/09/2009 Document Revised: 10/19/2015 Document Reviewed: 06/17/2014 Elsevier Interactive Patient Education  2017 [employer].

## 2022-09-23 DIAGNOSIS — M2042 Other hammer toe(s) (acquired), left foot: Secondary | ICD-10-CM | POA: Diagnosis not present

## 2022-09-23 DIAGNOSIS — M79676 Pain in unspecified toe(s): Secondary | ICD-10-CM | POA: Diagnosis not present

## 2022-09-23 DIAGNOSIS — M79675 Pain in left toe(s): Secondary | ICD-10-CM | POA: Diagnosis not present

## 2022-10-11 ENCOUNTER — Encounter: Payer: Self-pay | Admitting: Internal Medicine

## 2022-10-11 ENCOUNTER — Ambulatory Visit (INDEPENDENT_AMBULATORY_CARE_PROVIDER_SITE_OTHER): Payer: Medicare PPO | Admitting: Internal Medicine

## 2022-10-11 VITALS — BP 128/79 | HR 67 | Ht 64.0 in | Wt 156.0 lb

## 2022-10-11 DIAGNOSIS — I10 Essential (primary) hypertension: Secondary | ICD-10-CM

## 2022-10-11 DIAGNOSIS — Z131 Encounter for screening for diabetes mellitus: Secondary | ICD-10-CM

## 2022-10-11 DIAGNOSIS — J4520 Mild intermittent asthma, uncomplicated: Secondary | ICD-10-CM | POA: Diagnosis not present

## 2022-10-11 DIAGNOSIS — E782 Mixed hyperlipidemia: Secondary | ICD-10-CM

## 2022-10-11 DIAGNOSIS — Z1231 Encounter for screening mammogram for malignant neoplasm of breast: Secondary | ICD-10-CM

## 2022-10-11 DIAGNOSIS — K2100 Gastro-esophageal reflux disease with esophagitis, without bleeding: Secondary | ICD-10-CM | POA: Diagnosis not present

## 2022-10-11 DIAGNOSIS — Z0000 Encounter for general adult medical examination without abnormal findings: Secondary | ICD-10-CM | POA: Diagnosis not present

## 2022-10-11 LAB — POCT URINALYSIS DIPSTICK
Bilirubin, UA: NEGATIVE
Blood, UA: NEGATIVE
Glucose, UA: NEGATIVE
Ketones, UA: NEGATIVE
Leukocytes, UA: NEGATIVE
Nitrite, UA: NEGATIVE
Protein, UA: NEGATIVE
Spec Grav, UA: 1.015 (ref 1.010–1.025)
Urobilinogen, UA: 0.2 E.U./dL
pH, UA: 6 (ref 5.0–8.0)

## 2022-10-11 NOTE — Patient Instructions (Signed)
-  It was a pleasure to see you today! Please review your visit summary for helpful information. -Lab results are usually available within 1-2 days and we will call once reviewed. -I would encourage you to follow your care via MyChart where you can access lab results, notes, messages, and more. -If you feel that we did a nice job today, please complete your after-visit survey and leave [place] a Google review! Your CMA today was Wilkie Aye and your provider was Dr Bari Edward, MD.

## 2022-10-11 NOTE — Assessment & Plan Note (Signed)
Symptoms stable with no recent exacerbation. Continue Breztri and nebs as needed

## 2022-10-11 NOTE — Assessment & Plan Note (Signed)
Reflux symptoms are minimal on current therapy - omeprazole. No red flag signs such as weight loss, n/v, melena  

## 2022-10-11 NOTE — Assessment & Plan Note (Addendum)
Stable exam with well controlled BP.  Currently taking lisinopril and metoprolol. Tolerating medications without concerns or side effects. Continue heart healthy diet and regular exercise.

## 2022-10-11 NOTE — Progress Notes (Signed)
Date:  10/11/2022   Name:  Sarah Hernandez   DOB:  1951/04/07   MRN:  [id number]   Chief Complaint: Annual Exam Sarah Hernandez is a 72 y.o. female who presents today for her Complete Annual Exam. She feels well. She reports exercising walking. She reports she is sleeping well. Breast complaints none.  Mammogram: 10/2021 - scheduled DEXA: 10/2021 Normal Colonoscopy: 09/2013 repeat 10 yrs  Health Maintenance Due  Topic Date Due   COVID-19 Vaccine (7 - 2023-24 season) 01/25/2022    Immunization History  Administered Date(s) Administered   Fluad Quad(high Dose 65+) 01/20/2019, 02/16/2020, 01/14/2022   Influenza, High Dose Seasonal PF 02/02/2018, 02/11/2021   Influenza,inj,Quad PF,6+ Mos 03/08/2015, 02/14/2016, 01/28/2017   Influenza-Unspecified 03/08/2015, 02/14/2016, 01/28/2017   PFIZER Comirnaty(Gray Top)Covid-19 Tri-Sucrose Vaccine 03/08/2020, 09/20/2020   PFIZER(Purple Top)SARS-COV-2 Vaccination 07/17/2019, 08/08/2019   PNEUMOCOCCAL CONJUGATE-20 09/07/2022   Pfizer Covid-19 Vaccine Bivalent Booster 68yrs & up 02/11/2021, 10/28/2021   Pneumococcal Conjugate-13 08/10/2014   Pneumococcal Polysaccharide-23 06/15/2013   Tdap 04/15/2007, 05/28/2008, 10/28/2018   Zoster Recombinat (Shingrix) 03/07/2019, 05/07/2019   Zoster, Live 05/28/2012    Hypertension This is a chronic problem. The problem has been waxing and waning since onset. The problem is resistant. Pertinent negatives include no chest pain, headaches, palpitations or shortness of breath. Past treatments include beta blockers and ACE inhibitors. The current treatment provides moderate improvement. There is no history of kidney disease, CAD/MI or CVA.  Hyperlipidemia This is a chronic problem. The problem is controlled. Pertinent negatives include no chest pain or shortness of breath. Current antihyperlipidemic treatment includes statins. The current treatment provides significant improvement of lipids.  Asthma There is no  cough, shortness of breath or wheezing. This is a recurrent problem. The problem has been unchanged. Pertinent negatives include no chest pain, fever, headaches or trouble swallowing. Her past medical history is significant for asthma.    Lab Results  Component Value Date   NA 137 10/26/2021   K 4.7 10/26/2021   CO2 22 10/26/2021   GLUCOSE 103 (H) 10/26/2021   BUN 15 10/26/2021   CREATININE 0.90 10/26/2021   CALCIUM 9.2 10/26/2021   EGFR 68 10/26/2021   GFRNONAA 60 09/13/2019   Lab Results  Component Value Date   CHOL 162 10/08/2021   HDL 53 10/08/2021   LDLCALC 89 10/08/2021   TRIG 112 10/08/2021   CHOLHDL 3.1 10/08/2021   Lab Results  Component Value Date   TSH 4.550 (H) 10/08/2021   No results found for: "HGBA1C" Lab Results  Component Value Date   WBC 6.2 10/26/2021   HGB 12.0 10/26/2021   HCT 35.3 10/26/2021   MCV 89 10/26/2021   PLT 279 10/26/2021   Lab Results  Component Value Date   ALT 25 10/26/2021   AST 18 10/26/2021   GGT 11 02/25/2019   ALKPHOS 103 10/26/2021   BILITOT 0.4 10/26/2021   No results found for: "25OHVITD2", "25OHVITD3", "VD25OH"   Review of Systems  Constitutional:  Negative for chills, fatigue and fever.  HENT:  Negative for congestion, hearing loss, tinnitus, trouble swallowing and voice change.   Eyes:  Negative for visual disturbance.  Respiratory:  Negative for cough, chest tightness, shortness of breath and wheezing.   Cardiovascular:  Negative for chest pain, palpitations and leg swelling.  Gastrointestinal:  Negative for abdominal pain, constipation, diarrhea and vomiting.  Endocrine: Negative for polydipsia and polyuria.  Genitourinary:  Negative for dysuria, frequency, genital sores, vaginal bleeding and vaginal discharge.  Musculoskeletal:  Negative for arthralgias, gait problem and joint swelling.  Skin:  Negative for color change and rash.  Neurological:  Negative for dizziness, tremors, light-headedness and headaches.   Hematological:  Negative for adenopathy. Does not bruise/bleed easily.  Psychiatric/Behavioral:  Negative for dysphoric mood and sleep disturbance. The patient is not nervous/anxious.     Patient Active Problem List   Diagnosis Date Noted   History of basal cell cancer 09/13/2019   Generalized anxiety disorder 09/09/2018   OAB (overactive bladder) 08/21/2016   Esophagitis, reflux 10/05/2014   Hemorrhoids, internal 10/05/2014   Hyperlipidemia, mixed 10/05/2014   Benign hypertension 10/05/2014   Irritable bowel syndrome with constipation 10/05/2014   Mild intermittent asthma without complication 10/05/2014   Allergic rhinitis, seasonal 10/05/2014   DD (diverticular disease) 12/13/2013    Allergies  Allergen Reactions   Amlodipine Swelling   Flagyl [Metronidazole] Nausea Only    Past Surgical History:  Procedure Laterality Date   ABDOMINAL HYSTERECTOMY  1997   APPENDECTOMY  1997   COLONOSCOPY  2015   diverticulosis   PARTIAL HYSTERECTOMY  1979   bleeding from fibroids   TONSILLECTOMY  1966   UPPER GASTROINTESTINAL ENDOSCOPY  09/2014   gastritis/esoph; no Barrett's    Social History   Tobacco Use   Smoking status: Never   Smokeless tobacco: Never  Vaping Use   Vaping Use: Never used  Substance Use Topics   Alcohol use: Yes    Alcohol/week: 5.0 standard drinks of alcohol    Types: 5 Shots of liquor per week    Comment: Occasionally   Drug use: Not Currently     Medication list has been reviewed and updated.  Current Meds  Medication Sig   albuterol (PROVENTIL) (2.5 MG/3ML) 0.083% nebulizer solution Inhale 3 mLs into the lungs 4 (four) times daily as needed. Reported on 08/16/2015   albuterol (VENTOLIN HFA) 108 (90 Base) MCG/ACT inhaler TAKE 2 PUFFS BY MOUTH EVERY 6 HOURS AS NEEDED FOR WHEEZE OR SHORTNESS OF BREATH   atorvastatin (LIPITOR) 40 MG tablet TAKE 1 TABLET BY MOUTH EVERY DAY   Budeson-Glycopyrrol-Formoterol (BREZTRI AEROSPHERE) 160-9-4.8 MCG/ACT AERO  Inhale 2 puffs into the lungs 2 (two) times daily.   Cyanocobalamin (VITAMIN B-12 PO) Place 1 drop under the tongue daily.   ipratropium-albuterol (DUONEB) 0.5-2.5 (3) MG/3ML SOLN Take 3 mLs by nebulization every 6 (six) hours as needed.   lisinopril (ZESTRIL) 40 MG tablet Take 1 tablet (40 mg total) by mouth daily.   metoprolol succinate (TOPROL-XL) 25 MG 24 hr tablet Take 1 tablet (25 mg total) by mouth daily.   omeprazole (PRILOSEC) 20 MG capsule TAKE 1 CAPSULE BY MOUTH EVERY DAY   sertraline (ZOLOFT) 50 MG tablet TAKE 1 TABLET BY MOUTH EVERY DAY   VITAMIN D PO Take 2,000 Units by mouth daily.       10/11/2022    7:57 AM 01/31/2022    2:19 PM 01/23/2022    4:08 PM 10/26/2021    9:40 AM  GAD 7 : Generalized Anxiety Score  Nervous, Anxious, on Edge 0 0 0 0  Control/stop worrying 0 0 0 0  Worry too much - different things 0 0 0 0  Trouble relaxing 0 0 0 0  Restless 0 0 0 0  Easily annoyed or irritable 0 0 0 0  Afraid - awful might happen 0 0 0 0  Total GAD 7 Score 0 0 0 0  Anxiety Difficulty Not difficult at all Not difficult at all Not  difficult at all Not difficult at all       10/11/2022    7:57 AM 09/18/2022    8:17 AM 01/31/2022    2:19 PM  Depression screen PHQ 2/9  Decreased Interest 0 0 0  Down, Depressed, Hopeless 0 0 0  PHQ - 2 Score 0 0 0  Altered sleeping 0 0 0  Tired, decreased energy 0 0 3  Change in appetite 0 0 0  Feeling bad or failure about yourself  0 0 0  Trouble concentrating 0 0 0  Moving slowly or fidgety/restless 0 0 0  Suicidal thoughts 0 0 0  PHQ-9 Score 0 0 3  Difficult doing work/chores Not difficult at all Not difficult at all Not difficult at all    BP Readings from Last 3 Encounters:  10/11/22 128/79  09/18/22 (!) 152/90  01/31/22 132/68    Physical Exam Vitals and nursing note reviewed.  Constitutional:      General: She is not in acute distress.    Appearance: She is well-developed.  HENT:     Head: Normocephalic and atraumatic.      Right Ear: Tympanic membrane and ear canal normal.     Left Ear: Tympanic membrane and ear canal normal.     Nose:     Right Sinus: No maxillary sinus tenderness.     Left Sinus: No maxillary sinus tenderness.  Eyes:     General: No scleral icterus.       Right eye: No discharge.        Left eye: No discharge.     Conjunctiva/sclera: Conjunctivae normal.  Neck:     Thyroid: No thyromegaly.     Vascular: No carotid bruit.  Cardiovascular:     Rate and Rhythm: Normal rate and regular rhythm.     Pulses: Normal pulses.     Heart sounds: Normal heart sounds.  Pulmonary:     Effort: Pulmonary effort is normal. No respiratory distress.     Breath sounds: No wheezing.  Chest:  Breasts:    Right: No mass, nipple discharge, skin change or tenderness.     Left: No mass, nipple discharge, skin change or tenderness.  Abdominal:     General: Bowel sounds are normal.     Palpations: Abdomen is soft.     Tenderness: There is no abdominal tenderness.  Musculoskeletal:     Cervical back: Normal range of motion. No erythema.     Right lower leg: No edema.     Left lower leg: No edema.  Lymphadenopathy:     Cervical: No cervical adenopathy.  Skin:    General: Skin is warm and dry.     Findings: No rash.  Neurological:     Mental Status: She is alert and oriented to person, place, and time.     Cranial Nerves: No cranial nerve deficit.     Sensory: No sensory deficit.     Deep Tendon Reflexes: Reflexes are normal and symmetric.  Psychiatric:        Attention and Perception: Attention normal.        Mood and Affect: Mood normal.   Urine dipstick shows negative for all components.  Micro exam: not done.   Wt Readings from Last 3 Encounters:  10/11/22 156 lb (70.8 kg)  09/18/22 156 lb 3.2 oz (70.9 kg)  01/31/22 153 lb (69.4 kg)    BP 128/79 (BP Location: Left Arm, Cuff Size: Large)   Pulse 67   Ht  5\' 4"  (1.626 m)   Wt 156 lb (70.8 kg)   SpO2 95%   BMI 26.78 kg/m   Assessment  and Plan:  Problem List Items Addressed This Visit     Esophagitis, reflux (Chronic)    Reflux symptoms are minimal on current therapy - omeprazole. No red flag signs such as weight loss, n/v, melena       Hyperlipidemia, mixed (Chronic)    Tolerating statin medications.  No side effects noted. LDL is  Lab Results  Component Value Date   LDLCALC 89 10/08/2021  Will advise if dose change is indicated.       Relevant Orders   Lipid panel   Benign hypertension (Chronic)    Stable exam with well controlled BP.  Currently taking lisinopril and metoprolol. Tolerating medications without concerns or side effects. Continue heart healthy diet and regular exercise.        Relevant Orders   CBC with Differential/Platelet   Comprehensive metabolic panel   TSH   POCT urinalysis dipstick (Completed)   Mild intermittent asthma without complication (Chronic)    Symptoms stable with no recent exacerbation. Continue Breztri and nebs as needed      Relevant Orders   CBC with Differential/Platelet   Other Visit Diagnoses     Annual physical exam    -  Primary   Encounter for screening mammogram for breast cancer       scheduled at [facility]   Screening for diabetes mellitus       Relevant Orders   Hemoglobin A1c       Return in about 6 months (around 04/13/2023) for HTN.   Partially dictated using Dragon software, any errors are not intentional.  Reubin Milan, MD [facility] [place], [place]

## 2022-10-11 NOTE — Assessment & Plan Note (Signed)
Tolerating statin medications.  No side effects noted. LDL is  Lab Results  Component Value Date   LDLCALC 89 10/08/2021   Will advise if dose change is indicated.

## 2022-10-12 LAB — LIPID PANEL
Chol/HDL Ratio: 2.7 ratio (ref 0.0–4.4)
Cholesterol, Total: 172 mg/dL (ref 100–199)
HDL: 63 mg/dL (ref >39)
LDL Chol Calc (NIH): 88 mg/dL (ref 0–99)
Triglycerides: 119 mg/dL (ref 0–149)
VLDL Cholesterol Cal: 21 mg/dL (ref 5–40)

## 2022-10-12 LAB — CBC WITH DIFFERENTIAL/PLATELET
Basophils Absolute: 0.1 10*3/uL (ref 0.0–0.2)
Basos: 1 %
EOS (ABSOLUTE): 0.2 10*3/uL (ref 0.0–0.4)
Eos: 4 %
Hematocrit: 36 % (ref 34.0–46.6)
Hemoglobin: 12.1 g/dL (ref 11.1–15.9)
Immature Grans (Abs): 0 10*3/uL (ref 0.0–0.1)
Immature Granulocytes: 0 %
Lymphocytes Absolute: 1.9 10*3/uL (ref 0.7–3.1)
Lymphs: 29 %
MCH: 30 pg (ref 26.6–33.0)
MCHC: 33.6 g/dL (ref 31.5–35.7)
MCV: 89 fL (ref 79–97)
Monocytes Absolute: 0.4 10*3/uL (ref 0.1–0.9)
Monocytes: 6 %
Neutrophils Absolute: 4 10*3/uL (ref 1.4–7.0)
Neutrophils: 60 %
Platelets: 255 10*3/uL (ref 150–450)
RBC: 4.03 x10E6/uL (ref 3.77–5.28)
RDW: 12.1 % (ref 11.7–15.4)
WBC: 6.6 10*3/uL (ref 3.4–10.8)

## 2022-10-12 LAB — COMPREHENSIVE METABOLIC PANEL
ALT: 22 IU/L (ref 0–32)
AST: 16 IU/L (ref 0–40)
Albumin/Globulin Ratio: 1.5 (ref 1.2–2.2)
Albumin: 4 g/dL (ref 3.8–4.8)
Alkaline Phosphatase: 89 IU/L (ref 44–121)
BUN/Creatinine Ratio: 16 (ref 12–28)
BUN: 16 mg/dL (ref 8–27)
Bilirubin Total: 0.4 mg/dL (ref 0.0–1.2)
CO2: 22 mmol/L (ref 20–29)
Calcium: 9.7 mg/dL (ref 8.7–10.3)
Chloride: 105 mmol/L (ref 96–106)
Creatinine, Ser: 0.97 mg/dL (ref 0.57–1.00)
Globulin, Total: 2.6 g/dL (ref 1.5–4.5)
Glucose: 90 mg/dL (ref 70–99)
Potassium: 4.8 mmol/L (ref 3.5–5.2)
Sodium: 140 mmol/L (ref 134–144)
Total Protein: 6.6 g/dL (ref 6.0–8.5)
eGFR: 61 mL/min/{1.73_m2} (ref >59)

## 2022-10-12 LAB — TSH: TSH: 2.72 u[IU]/mL (ref 0.450–4.500)

## 2022-10-12 LAB — HEMOGLOBIN A1C
Est. average glucose Bld gHb Est-mCnc: 114 mg/dL
Hgb A1c MFr Bld: 5.6 % (ref 4.8–5.6)

## 2022-10-26 ENCOUNTER — Encounter: Payer: Self-pay | Admitting: Internal Medicine

## 2022-10-28 ENCOUNTER — Other Ambulatory Visit: Payer: Self-pay | Admitting: Internal Medicine

## 2022-10-28 DIAGNOSIS — K2100 Gastro-esophageal reflux disease with esophagitis, without bleeding: Secondary | ICD-10-CM

## 2022-11-14 ENCOUNTER — Other Ambulatory Visit: Payer: Self-pay | Admitting: Internal Medicine

## 2022-11-14 DIAGNOSIS — E782 Mixed hyperlipidemia: Secondary | ICD-10-CM

## 2022-11-18 ENCOUNTER — Telehealth: Payer: Self-pay | Admitting: Internal Medicine

## 2022-11-18 DIAGNOSIS — Z1231 Encounter for screening mammogram for malignant neoplasm of breast: Secondary | ICD-10-CM | POA: Diagnosis not present

## 2022-11-18 NOTE — Telephone Encounter (Signed)
Called patient and informed of results. Patient will await call from imagine department to schedule additional images and [place].   -Sarah Hernandez

## 2022-11-18 NOTE — Telephone Encounter (Signed)
Let the patient know that her mammogram shows a density on the left breast.  The mammography department will be sending her a letter to schedule repeat views and [place].

## 2022-11-29 ENCOUNTER — Other Ambulatory Visit: Payer: Self-pay | Admitting: Internal Medicine

## 2022-11-29 DIAGNOSIS — I10 Essential (primary) hypertension: Secondary | ICD-10-CM

## 2022-12-19 DIAGNOSIS — R928 Other abnormal and inconclusive findings on diagnostic imaging of breast: Secondary | ICD-10-CM | POA: Diagnosis not present

## 2023-01-13 ENCOUNTER — Telehealth (INDEPENDENT_AMBULATORY_CARE_PROVIDER_SITE_OTHER): Payer: Medicare PPO | Admitting: Physician Assistant

## 2023-01-13 ENCOUNTER — Encounter: Payer: Self-pay | Admitting: Physician Assistant

## 2023-01-13 ENCOUNTER — Ambulatory Visit: Payer: Self-pay | Admitting: *Deleted

## 2023-01-13 VITALS — Temp 96.8°F | Ht 64.0 in

## 2023-01-13 DIAGNOSIS — U071 COVID-19: Secondary | ICD-10-CM

## 2023-01-13 MED ORDER — GUAIFENESIN-DM 100-10 MG/5ML PO SYRP
10.0000 mL | ORAL_SOLUTION | ORAL | 0 refills | Status: DC | PRN
Start: 2023-01-13 — End: 2023-04-15

## 2023-01-13 MED ORDER — NIRMATRELVIR/RITONAVIR (PAXLOVID)TABLET
3.0000 | ORAL_TABLET | Freq: Two times a day (BID) | ORAL | 0 refills | Status: AC
Start: 2023-01-13 — End: 2023-01-18

## 2023-01-13 NOTE — Telephone Encounter (Signed)
Message from Sarah Hernandez sent at 01/13/2023 10:06 AM EDT  Summary: COVID Advice   Pt is calling to report that she tested positive for COVID today with congestion. Please advise         Chief Complaint: covid positive- cough Symptoms: productive cough, fatigue, achy Frequency: yesterday  Pertinent Negatives: Patient denies SOB, fever Disposition: [] ED /[] Urgent Care (no appt availability in office) / [x] Appointment(In office/virtual)/ []  [facility]/ [] Home Care/ [] Refused Recommended Disposition /[] [place]/ []  Follow-up with PCP Additional Notes: Appt with D. Wadell PA  Reason for Disposition  [1] HIGH RISK patient (e.g., weak immune system, age > 64 years, obesity with BMI 30 or higher, pregnant, chronic lung disease or other chronic medical condition) AND [2] COVID symptoms (e.g., cough, fever)  (Exceptions: Already seen by PCP and no new or worsening symptoms.)  Answer Assessment - Initial Assessment Questions 1. COVID-19 DIAGNOSIS: "How do you know that you have COVID?" (e.g., positive lab test or self-test, diagnosed by doctor or NP/PA, symptoms after exposure).     Self test  2. COVID-19 EXPOSURE: "Was there any known exposure to COVID before the symptoms began?" CDC Definition of close contact: within 6 feet (2 meters) for a total of 15 minutes or more over a 24-hour period.      Work  3. ONSET: "When did the COVID-19 symptoms start?"      Yesterday  5. COUGH: "Do you have a cough?" If Yes, ask: "How bad is the cough?"       Yes occasional  productive white - lt yellow  thick  6. FEVER: "Do you have a fever?" If Yes, ask: "What is your temperature, how was it measured, and when did it start?"     no 7. RESPIRATORY STATUS: "Describe your breathing?" (e.g., normal; shortness of breath, wheezing, unable to speak)      no 9. OTHER SYMPTOMS: "Do you have any other symptoms?"  (e.g., chills, fatigue, headache, loss of smell or taste, muscle pain, sore  throat)     Achy congestion fatigue  Protocols used: Coronavirus (COVID-19) Diagnosed or Suspected-A-AH

## 2023-01-13 NOTE — Telephone Encounter (Signed)
Summary: COVID Advice   Pt is calling to report that she tested positive for COVID today with congestion. Please advise     Attempted to call patient- no answer- left message to call office

## 2023-01-13 NOTE — Progress Notes (Signed)
Date:  01/13/2023   Name:  Sarah Hernandez   DOB:  [birth year]   MRN:  [id number]  I connected with Sarah Hernandez on 01/13/2023 via MyChart Video and verified that I am speaking with the correct Sarah Hernandez using appropriate identifiers. The limitations, risks, security and privacy concerns of performing an evaluation and management service by MyChart Video, including the higher likelihood of inaccurate diagnoses and treatments, and the availability of in [place] appointments were reviewed. The possible need of an additional face-to-face encounter for complete and high quality delivery of care was discussed. The patient was also made aware that there may be a patient responsible charge related to this service. The patient expressed understanding and wishes to proceed.   Provider location is in medical facility [facility] Primary Care and Sports Medicine at [facility]). Patient location is at their home People involved in care of the patient during this telehealth encounter were myself, my CMA, and my front office/scheduling team member.  Chief Complaint: Cough (Covid positive, requesting paxlovid)  Cough This is a new problem. Episode onset: X2-3 days. The problem has been gradually worsening. The problem occurs every few hours. The cough is Productive of sputum (thick and white/ light yellow). Associated symptoms include ear congestion, headaches, nasal congestion, postnasal drip, rhinorrhea and a sore throat. Nothing aggravates the symptoms. She has tried rest (mucinex, aleve) for the symptoms. The treatment provided mild relief. Her past medical history is significant for asthma.    Sarah Hernandez is a pleasant 72 y.o. female with mild intermittent asthma new to me today (typically sees my colleague Dr. Bari Edward, MD) presenting via telehealth for positive COVID test this morning at work and again at home.  Symptoms as above and worsening each day.  Cough keeping her up at night. No wheezing yet.  Requesting Paxlovid, never taken before.  Up-to-date on her COVID vaccines.  Husband testing negative so far.  Medication list has been reviewed and updated.  Current Meds  Medication Sig   albuterol (PROVENTIL) (2.5 MG/3ML) 0.083% nebulizer solution Inhale 3 mLs into the lungs 4 (four) times daily as needed. Reported on 08/16/2015   albuterol (VENTOLIN HFA) 108 (90 Base) MCG/ACT inhaler TAKE 2 PUFFS BY MOUTH EVERY 6 HOURS AS NEEDED FOR WHEEZE OR SHORTNESS OF BREATH   atorvastatin (LIPITOR) 40 MG tablet TAKE 1 TABLET BY MOUTH EVERY DAY   Budeson-Glycopyrrol-Formoterol (BREZTRI AEROSPHERE) 160-9-4.8 MCG/ACT AERO Inhale 2 puffs into the lungs 2 (two) times daily.   Cyanocobalamin (VITAMIN B-12 PO) Place 1 drop under the tongue daily.   guaiFENesin-dextromethorphan (ROBITUSSIN DM) 100-10 MG/5ML syrup Take 10 mLs by mouth every 4 (four) hours as needed for cough.   ipratropium-albuterol (DUONEB) 0.5-2.5 (3) MG/3ML SOLN Take 3 mLs by nebulization every 6 (six) hours as needed.   lisinopril (ZESTRIL) 40 MG tablet Take 1 tablet (40 mg total) by mouth daily.   metoprolol succinate (TOPROL-XL) 25 MG 24 hr tablet TAKE 1 TABLET (25 MG TOTAL) BY MOUTH DAILY.   nirmatrelvir/ritonavir (PAXLOVID) 20 x 150 MG & 10 x 100MG  TABS Take 3 tablets by mouth 2 (two) times daily for 5 days. (Take nirmatrelvir 150 mg two tablets twice daily for 5 days and ritonavir 100 mg one tablet twice daily for 5 days) Patient GFR is 61. STOP ATORVASTATIN while taking this medication - you may restart it 48h after your last dose of Paxlovid.   nitroGLYCERIN (NITROSTAT) 0.4 MG SL tablet Place 1 tablet (0.4 mg total) under the tongue  every 5 (five) minutes as needed for chest pain.   omeprazole (PRILOSEC) 20 MG capsule TAKE 1 CAPSULE BY MOUTH EVERY DAY   sertraline (ZOLOFT) 50 MG tablet TAKE 1 TABLET BY MOUTH EVERY DAY   VITAMIN D PO Take 2,000 Units by mouth daily.     Review of Systems  HENT:  Positive for postnasal drip,  rhinorrhea and sore throat.   Respiratory:  Positive for cough.   Neurological:  Positive for headaches.    Patient Active Problem List   Diagnosis Date Noted   History of basal cell cancer 09/13/2019   Generalized anxiety disorder 09/09/2018   OAB (overactive bladder) 08/21/2016   Esophagitis, reflux 10/05/2014   Hemorrhoids, internal 10/05/2014   Hyperlipidemia, mixed 10/05/2014   Benign hypertension 10/05/2014   Irritable bowel syndrome with constipation 10/05/2014   Mild intermittent asthma without complication 10/05/2014   Allergic rhinitis, seasonal 10/05/2014   DD (diverticular disease) 12/13/2013    Allergies  Allergen Reactions   Amlodipine Swelling   Flagyl [Metronidazole] Nausea Only    Immunization History  Administered Date(s) Administered   Fluad Quad(high Dose 65+) 01/20/2019, 02/16/2020, 01/14/2022   Influenza, High Dose Seasonal PF 02/02/2018, 02/11/2021, 01/04/2023   Influenza,inj,Quad PF,6+ Mos 03/08/2015, 02/14/2016, 01/28/2017   Influenza-Unspecified 03/08/2015, 02/14/2016, 01/28/2017   PFIZER Comirnaty(Gray Top)Covid-19 Tri-Sucrose Vaccine 03/08/2020, 09/20/2020   PFIZER(Purple Top)SARS-COV-2 Vaccination 07/17/2019, 08/08/2019   PNEUMOCOCCAL CONJUGATE-20 09/07/2022   Pfizer Covid-19 Vaccine Bivalent Booster 11yrs & up 02/11/2021, 10/28/2021   Pneumococcal Conjugate-13 08/10/2014   Pneumococcal Polysaccharide-23 06/15/2013   Tdap 04/15/2007, 05/28/2008, 10/28/2018   Zoster Recombinant(Shingrix) 03/07/2019, 05/07/2019   Zoster, Live 05/28/2012    Past Surgical History:  Procedure Laterality Date   ABDOMINAL HYSTERECTOMY  1997   APPENDECTOMY  1997   COLONOSCOPY  2015   diverticulosis   PARTIAL HYSTERECTOMY  1979   bleeding from fibroids   TONSILLECTOMY  1966   UPPER GASTROINTESTINAL ENDOSCOPY  09/2014   gastritis/esoph; no Barrett's    Social History   Tobacco Use   Smoking status: Never   Smokeless tobacco: Never  Vaping Use   Vaping  status: Never Used  Substance Use Topics   Alcohol use: Yes    Alcohol/week: 5.0 standard drinks of alcohol    Types: 5 Shots of liquor per week    Comment: Occasionally   Drug use: Not Currently    Family History  Problem Relation Age of Onset   Dementia Mother    Varicose Veins Mother    CAD Father    Heart disease Father    CAD Brother    Stroke Brother    Heart disease Son         01/13/2023    2:46 PM 10/11/2022    7:57 AM 01/31/2022    2:19 PM 01/23/2022    4:08 PM  GAD 7 : Generalized Anxiety Score  Nervous, Anxious, on Edge 0 0 0 0  Control/stop worrying 0 0 0 0  Worry too much - different things 0 0 0 0  Trouble relaxing 0 0 0 0  Restless 0 0 0 0  Easily annoyed or irritable 0 0 0 0  Afraid - awful might happen 0 0 0 0  Total GAD 7 Score 0 0 0 0  Anxiety Difficulty Not difficult at all Not difficult at all Not difficult at all Not difficult at all       01/13/2023    2:46 PM 10/11/2022    7:57 AM 09/18/2022  8:17 AM  Depression screen PHQ 2/9  Decreased Interest 0 0 0  Down, Depressed, Hopeless 0 0 0  PHQ - 2 Score 0 0 0  Altered sleeping 1 0 0  Tired, decreased energy 1 0 0  Change in appetite 0 0 0  Feeling bad or failure about yourself  0 0 0  Trouble concentrating  0 0  Moving slowly or fidgety/restless 0 0 0  Suicidal thoughts 0 0 0  PHQ-9 Score 2 0 0  Difficult doing work/chores Not difficult at all Not difficult at all Not difficult at all    BP Readings from Last 3 Encounters:  10/11/22 128/79  09/18/22 (!) 152/90  01/31/22 132/68    Wt Readings from Last 3 Encounters:  10/11/22 156 lb (70.8 kg)  09/18/22 156 lb 3.2 oz (70.9 kg)  01/31/22 153 lb (69.4 kg)    Temp (!) 96.8 F (36 C) (Oral)   Ht 5\' 4"  (1.626 m)   BMI 26.78 kg/m   Physical Exam Vitals and nursing note reviewed.  Constitutional:      Appearance: Normal appearance.  Pulmonary:     Effort: Pulmonary effort is normal.  Neurological:     Mental Status: She is alert  and oriented to Flora Ratz, place, and time.  Psychiatric:        Mood and Affect: Mood and affect normal.    General: Speaking full sentences, no audible heavy breathing. Sounds alert and appropriately interactive. Well-appearing. Face symmetric. Extraocular movements intact. Pupils equal and round. No nasal flaring or accessory muscle use visualized.  Recent Labs     Component Value Date/Time   NA 140 10/11/2022 0927   K 4.8 10/11/2022 0927   CL 105 10/11/2022 0927   CO2 22 10/11/2022 0927   GLUCOSE 90 10/11/2022 0927   GLUCOSE 111 (H) 07/09/2019 1352   BUN 16 10/11/2022 0927   CREATININE 0.97 10/11/2022 0927   CALCIUM 9.7 10/11/2022 0927   PROT 6.6 10/11/2022 0927   ALBUMIN 4.0 10/11/2022 0927   AST 16 10/11/2022 0927   ALT 22 10/11/2022 0927   ALKPHOS 89 10/11/2022 0927   BILITOT 0.4 10/11/2022 0927   GFRNONAA 60 09/13/2019 0902   GFRAA 70 09/13/2019 0902    Lab Results  Component Value Date   WBC 6.6 10/11/2022   HGB 12.1 10/11/2022   HCT 36.0 10/11/2022   MCV 89 10/11/2022   PLT 255 10/11/2022   Lab Results  Component Value Date   HGBA1C 5.6 10/11/2022   Lab Results  Component Value Date   CHOL 172 10/11/2022   HDL 63 10/11/2022   LDLCALC 88 10/11/2022   TRIG 119 10/11/2022   CHOLHDL 2.7 10/11/2022   Lab Results  Component Value Date   TSH 2.720 10/11/2022     Assessment and Plan:  1. Acute COVID-19 Considering the patient's comorbidities including asthma and advanced age, I am prescribing Paxlovid for her to initiate today.  Discussed common side effects with patient, and she will need to pause atorvastatin while taking this medication.  I sending some cough syrup as well to help her sleep.  Also advised conservative measures including rest, fluids, and OTC cough/cold medications. Contact precautions advised to limit spread. Encouraged mask wearing and good hand hygiene especially before meals. Call if acutely worsening symptoms or if no improvement in 5  days  - nirmatrelvir/ritonavir (PAXLOVID) 20 x 150 MG & 10 x 100MG  TABS; Take 3 tablets by mouth 2 (two) times daily for 5  days. (Take nirmatrelvir 150 mg two tablets twice daily for 5 days and ritonavir 100 mg one tablet twice daily for 5 days) Patient GFR is 61. STOP ATORVASTATIN while taking this medication - you may restart it 48h after your last dose of Paxlovid.  Dispense: 30 tablet; Refill: 0 - guaiFENesin-dextromethorphan (ROBITUSSIN DM) 100-10 MG/5ML syrup; Take 10 mLs by mouth every 4 (four) hours as needed for cough.  Dispense: 118 mL; Refill: 0   I discussed the above assessment and treatment plan with the patient. The patient was provided an opportunity to ask questions and all were answered. The patient agreed with the plan and demonstrated an understanding of the instructions. The patient was advised to call back or seek an in-[facility] evaluation if the symptoms worsen or if the condition fails to improve as anticipated. I provided a total time of 15 minutes inclusive of time utilized for medical chart review, information gathering, care coordination with staff, and documentation completion.   Alvester Morin, PA-C, DMSc, Nutritionist [facility] Primary Care and Sports Medicine [facility] [phone]

## 2023-01-14 ENCOUNTER — Other Ambulatory Visit: Payer: Self-pay | Admitting: Internal Medicine

## 2023-01-14 DIAGNOSIS — R051 Acute cough: Secondary | ICD-10-CM

## 2023-01-14 MED ORDER — BENZONATATE 200 MG PO CAPS
200.0000 mg | ORAL_CAPSULE | Freq: Two times a day (BID) | ORAL | 0 refills | Status: DC | PRN
Start: 2023-01-14 — End: 2023-04-15

## 2023-01-14 NOTE — Telephone Encounter (Signed)
Please review.  KP

## 2023-02-13 ENCOUNTER — Other Ambulatory Visit: Payer: Self-pay

## 2023-02-13 ENCOUNTER — Telehealth: Payer: Self-pay | Admitting: Internal Medicine

## 2023-02-13 DIAGNOSIS — I10 Essential (primary) hypertension: Secondary | ICD-10-CM

## 2023-02-13 MED ORDER — METOPROLOL SUCCINATE ER 25 MG PO TB24
25.0000 mg | ORAL_TABLET | Freq: Every day | ORAL | 0 refills | Status: DC
Start: 2023-02-13 — End: 2023-03-17

## 2023-02-13 MED ORDER — LISINOPRIL 40 MG PO TABS
40.0000 mg | ORAL_TABLET | Freq: Every day | ORAL | 0 refills | Status: DC
Start: 2023-02-13 — End: 2023-05-12

## 2023-02-13 NOTE — Telephone Encounter (Signed)
Both medications refilled.  - Lakelyn Straus

## 2023-02-13 NOTE — Telephone Encounter (Signed)
Medication Refill - Medication: lisinopril (ZESTRIL) 40 MG tablet , Metropolol 50mg  tablet  Has the patient contacted their pharmacy? No.   Preferred Pharmacy (with phone number or street name):  [id number] - [place],  - [address] Phone: [phone]  Fax: [phone]      Has the patient been seen for an appointment in the last year OR does the patient have an upcoming appointment? Yes.    The patient states she takes a 50 mg in the morning and a 25 mg at night. She didn't want to go back to her Cardiologist and at her recent physical told her provider about this medication

## 2023-03-15 ENCOUNTER — Encounter: Payer: Self-pay | Admitting: Internal Medicine

## 2023-03-17 ENCOUNTER — Other Ambulatory Visit: Payer: Self-pay | Admitting: Internal Medicine

## 2023-03-17 DIAGNOSIS — I10 Essential (primary) hypertension: Secondary | ICD-10-CM

## 2023-03-17 MED ORDER — METOPROLOL SUCCINATE ER 50 MG PO TB24: ORAL_TABLET | ORAL | 1 refills | Status: DC

## 2023-03-17 NOTE — Telephone Encounter (Signed)
Please review.  KP

## 2023-03-17 NOTE — Telephone Encounter (Signed)
Response.  KP

## 2023-04-15 ENCOUNTER — Encounter: Payer: Self-pay | Admitting: Internal Medicine

## 2023-04-15 ENCOUNTER — Ambulatory Visit: Payer: Medicare PPO | Admitting: Internal Medicine

## 2023-04-15 VITALS — BP 118/78 | HR 71 | Ht 64.0 in | Wt 161.4 lb

## 2023-04-15 DIAGNOSIS — K2100 Gastro-esophageal reflux disease with esophagitis, without bleeding: Secondary | ICD-10-CM

## 2023-04-15 DIAGNOSIS — I10 Essential (primary) hypertension: Secondary | ICD-10-CM

## 2023-04-15 DIAGNOSIS — Z23 Encounter for immunization: Secondary | ICD-10-CM

## 2023-04-15 DIAGNOSIS — J4520 Mild intermittent asthma, uncomplicated: Secondary | ICD-10-CM

## 2023-04-15 MED ORDER — OMEPRAZOLE 20 MG PO CPDR: DELAYED_RELEASE_CAPSULE | ORAL | 1 refills | Status: DC

## 2023-04-15 NOTE — Progress Notes (Signed)
Date:  04/15/2023   Name:  Sarah Hernandez   DOB:  08-Jun-1950   MRN:  [id number]   Chief Complaint: Hypertension  Hypertension This is a chronic problem. The problem is controlled. Pertinent negatives include no chest pain, headaches, palpitations or shortness of breath. Past treatments include ACE inhibitors and beta blockers.  Asthma There is no cough, shortness of breath or wheezing. Pertinent negatives include no chest pain, headaches or trouble swallowing. Her past medical history is significant for asthma.  Gastroesophageal Reflux She reports no abdominal pain, no chest pain, no coughing or no wheezing. Pertinent negatives include no fatigue.    Review of Systems  Constitutional:  Negative for fatigue and unexpected weight change.  HENT:  Negative for nosebleeds and trouble swallowing.   Eyes:  Negative for visual disturbance.  Respiratory:  Negative for cough, chest tightness, shortness of breath and wheezing.   Cardiovascular:  Negative for chest pain, palpitations and leg swelling.  Gastrointestinal:  Negative for abdominal pain, constipation and diarrhea.  Neurological:  Negative for dizziness, weakness, light-headedness and headaches.  Psychiatric/Behavioral:  Negative for dysphoric mood and sleep disturbance. The patient is not nervous/anxious.      Lab Results  Component Value Date   NA 140 10/11/2022   K 4.8 10/11/2022   CO2 22 10/11/2022   GLUCOSE 90 10/11/2022   BUN 16 10/11/2022   CREATININE 0.97 10/11/2022   CALCIUM 9.7 10/11/2022   EGFR 61 10/11/2022   GFRNONAA 60 09/13/2019   Lab Results  Component Value Date   CHOL 172 10/11/2022   HDL 63 10/11/2022   LDLCALC 88 10/11/2022   TRIG 119 10/11/2022   CHOLHDL 2.7 10/11/2022   Lab Results  Component Value Date   TSH 2.720 10/11/2022   Lab Results  Component Value Date   HGBA1C 5.6 10/11/2022   Lab Results  Component Value Date   WBC 6.6 10/11/2022   HGB 12.1 10/11/2022   HCT 36.0 10/11/2022    MCV 89 10/11/2022   PLT 255 10/11/2022   Lab Results  Component Value Date   ALT 22 10/11/2022   AST 16 10/11/2022   GGT 11 02/25/2019   ALKPHOS 89 10/11/2022   BILITOT 0.4 10/11/2022   No results found for: "25OHVITD2", "25OHVITD3", "VD25OH"   Patient Active Problem List   Diagnosis Date Noted   History of basal cell cancer 09/13/2019   Generalized anxiety disorder 09/09/2018   OAB (overactive bladder) 08/21/2016   Esophagitis, reflux 10/05/2014   Hemorrhoids, internal 10/05/2014   Hyperlipidemia, mixed 10/05/2014   Benign hypertension 10/05/2014   Irritable bowel syndrome with constipation 10/05/2014   Mild intermittent asthma without complication 10/05/2014   Allergic rhinitis, seasonal 10/05/2014   DD (diverticular disease) 12/13/2013    Allergies  Allergen Reactions   Amlodipine Swelling   Flagyl [Metronidazole] Nausea Only    Past Surgical History:  Procedure Laterality Date   ABDOMINAL HYSTERECTOMY  1997   APPENDECTOMY  1997   COLONOSCOPY  2015   diverticulosis   PARTIAL HYSTERECTOMY  1979   bleeding from fibroids   TONSILLECTOMY  1966   UPPER GASTROINTESTINAL ENDOSCOPY  09/2014   gastritis/esoph; no Barrett's    Social History   Tobacco Use   Smoking status: Never   Smokeless tobacco: Never  Vaping Use   Vaping status: Never Used  Substance Use Topics   Alcohol use: Yes    Alcohol/week: 5.0 standard drinks of alcohol    Types: 5 Shots of liquor per  week    Comment: Occasionally   Drug use: Not Currently     Medication list has been reviewed and updated.  Current Meds  Medication Sig   albuterol (PROVENTIL) (2.5 MG/3ML) 0.083% nebulizer solution Inhale 3 mLs into the lungs 4 (four) times daily as needed. Reported on 08/16/2015   albuterol (VENTOLIN HFA) 108 (90 Base) MCG/ACT inhaler TAKE 2 PUFFS BY MOUTH EVERY 6 HOURS AS NEEDED FOR WHEEZE OR SHORTNESS OF BREATH   atorvastatin (LIPITOR) 40 MG tablet TAKE 1 TABLET BY MOUTH EVERY DAY    Budeson-Glycopyrrol-Formoterol (BREZTRI AEROSPHERE) 160-9-4.8 MCG/ACT AERO Inhale 2 puffs into the lungs 2 (two) times daily.   Cyanocobalamin (VITAMIN B-12 PO) Place 1 drop under the tongue daily.   ipratropium-albuterol (DUONEB) 0.5-2.5 (3) MG/3ML SOLN Take 3 mLs by nebulization every 6 (six) hours as needed.   lisinopril (ZESTRIL) 40 MG tablet Take 1 tablet (40 mg total) by mouth daily.   metoprolol succinate (TOPROL-XL) 50 MG 24 hr tablet 50 mg in the AM and 25 mg in the PM   sertraline (ZOLOFT) 50 MG tablet TAKE 1 TABLET BY MOUTH EVERY DAY   VITAMIN D PO Take 2,000 Units by mouth daily.   [DISCONTINUED] omeprazole (PRILOSEC) 20 MG capsule TAKE 1 CAPSULE BY MOUTH EVERY DAY       04/15/2023    8:26 AM 01/13/2023    2:46 PM 10/11/2022    7:57 AM 01/31/2022    2:19 PM  GAD 7 : Generalized Anxiety Score  Nervous, Anxious, on Edge 0 0 0 0  Control/stop worrying 0 0 0 0  Worry too much - different things 0 0 0 0  Trouble relaxing 0 0 0 0  Restless 0 0 0 0  Easily annoyed or irritable 0 0 0 0  Afraid - awful might happen 0 0 0 0  Total GAD 7 Score 0 0 0 0  Anxiety Difficulty Not difficult at all Not difficult at all Not difficult at all Not difficult at all       04/15/2023    8:26 AM 01/13/2023    2:46 PM 10/11/2022    7:57 AM  Depression screen PHQ 2/9  Decreased Interest 0 0 0  Down, Depressed, Hopeless 0 0 0  PHQ - 2 Score 0 0 0  Altered sleeping 0 1 0  Tired, decreased energy 0 1 0  Change in appetite 0 0 0  Feeling bad or failure about yourself  0 0 0  Trouble concentrating 0  0  Moving slowly or fidgety/restless 0 0 0  Suicidal thoughts 0 0 0  PHQ-9 Score 0 2 0  Difficult doing work/chores Not difficult at all Not difficult at all Not difficult at all    BP Readings from Last 3 Encounters:  04/15/23 118/78  10/11/22 128/79  09/18/22 (!) 152/90    Physical Exam Vitals and nursing note reviewed.  Constitutional:      General: She is not in acute distress.     Appearance: Normal appearance. She is well-developed.  HENT:     Head: Normocephalic and atraumatic.  Neck:     Vascular: No carotid bruit.  Cardiovascular:     Rate and Rhythm: Normal rate and regular rhythm.     Pulses: Normal pulses.     Heart sounds: No murmur heard. Pulmonary:     Effort: Pulmonary effort is normal. No respiratory distress.     Breath sounds: No wheezing or rhonchi.  Musculoskeletal:  Right lower leg: No edema.     Left lower leg: No edema.  Lymphadenopathy:     Cervical: No cervical adenopathy.  Skin:    General: Skin is warm and dry.     Findings: No rash.  Neurological:     General: No focal deficit present.     Mental Status: She is alert and oriented to person, place, and time.  Psychiatric:        Mood and Affect: Mood normal.        Behavior: Behavior normal.     Wt Readings from Last 3 Encounters:  04/15/23 161 lb 6.4 oz (73.2 kg)  10/11/22 156 lb (70.8 kg)  09/18/22 156 lb 3.2 oz (70.9 kg)    BP 118/78   Pulse 71   Ht 5\' 4"  (1.626 m)   Wt 161 lb 6.4 oz (73.2 kg)   SpO2 98%   BMI 27.70 kg/m   Assessment and Plan:  Problem List Items Addressed This Visit       Unprioritized   Esophagitis, reflux (Chronic)   Relevant Medications   omeprazole (PRILOSEC) 20 MG capsule   Benign hypertension - Primary (Chronic)    Controlled BP with normal exam. Current regimen is lisinopril and metoprolol. Will continue same medications; encourage continued reduced sodium diet.       Mild intermittent asthma without complication (Chronic)    Asthma controlled on Breztri PRN No recent infections      Other Visit Diagnoses     Need for COVID-19 vaccine       Relevant Orders   Pfizer Comirnaty Covid -19 Vaccine 55yrs and older (Completed)       No follow-ups on file.    Reubin Milan, MD [facility] Primary Care and Sports Medicine [place]

## 2023-04-15 NOTE — Assessment & Plan Note (Signed)
 Controlled BP with normal exam. Current regimen is lisinopril and metoprolol. Will continue same medications; encourage continued reduced sodium diet.

## 2023-04-15 NOTE — Assessment & Plan Note (Signed)
Asthma controlled on Breztri PRN No recent infections

## 2023-05-11 ENCOUNTER — Other Ambulatory Visit: Payer: Self-pay | Admitting: Internal Medicine

## 2023-05-11 DIAGNOSIS — I10 Essential (primary) hypertension: Secondary | ICD-10-CM

## 2023-05-12 NOTE — Telephone Encounter (Signed)
Requested Prescriptions  Pending Prescriptions Disp Refills   lisinopril (ZESTRIL) 40 MG tablet [Pharmacy Med Name: LISINOPRIL 40 MG TABLET] 90 tablet 0    Sig: TAKE 1 TABLET BY MOUTH EVERY DAY     Cardiovascular:  ACE Inhibitors Failed - 05/12/2023  4:19 PM      Failed - Cr in normal range and within 180 days    Creatinine, Ser  Date Value Ref Range Status  10/11/2022 0.97 0.57 - 1.00 mg/dL Final         Failed - K in normal range and within 180 days    Potassium  Date Value Ref Range Status  10/11/2022 4.8 3.5 - 5.2 mmol/L Final         Passed - Patient is not pregnant      Passed - Last BP in normal range    BP Readings from Last 1 Encounters:  04/15/23 118/78         Passed - Valid encounter within last 6 months    Recent Outpatient Visits           3 weeks ago Benign hypertension   [place] [facility] Medicine at [facility], Nyoka Cowden, [place]   3 months ago Acute COVID-19   [facility] at [facility], Melton Alar, [place]   7 months ago Annual physical exam   [facility] Care & Sports Medicine at [facility], Nyoka Cowden, MD   1 year ago Asthma exacerbation, mild   [place] [facility] at [facility], [place]   1 year ago Asthma exacerbation, mild   [place] Primary Care & Sports Medicine at [facility], Nyoka Cowden, MD       Future Appointments             In 5 months Judithann Graves, Nyoka Cowden, MD [facility] Medicine at [facility], [facility]

## 2023-05-24 ENCOUNTER — Other Ambulatory Visit: Payer: Self-pay | Admitting: Internal Medicine

## 2023-05-24 DIAGNOSIS — I10 Essential (primary) hypertension: Secondary | ICD-10-CM

## 2023-05-29 NOTE — Telephone Encounter (Signed)
 Requested Prescriptions  Refused Prescriptions Disp Refills   metoprolol  succinate (TOPROL -XL) 25 MG 24 hr tablet [Pharmacy Med Name: METOPROLOL  SUCC ER 25 MG TAB] 90 tablet 0    Sig: TAKE 1 TABLET (25 MG TOTAL) BY MOUTH DAILY.     Cardiovascular:  Beta Blockers Passed - 05/29/2023 11:12 AM      Passed - Last BP in normal range    BP Readings from Last 1 Encounters:  04/15/23 118/78         Passed - Last Heart Rate in normal range    Pulse Readings from Last 1 Encounters:  04/15/23 71         Passed - Valid encounter within last 6 months    Recent Outpatient Visits           1 month ago Benign hypertension   [place] [facility] & Sports Medicine at [facility], Leita DEL, MD   4 months ago Acute COVID-19   [facility] Medicine at [facility], Toribio SQUIBB, [place]   7 months ago Annual physical exam   [facility] Medicine at [facility], Leita DEL, [place]   1 year ago Asthma exacerbation, mild   [place] [facility] & [facility] at [facility] Cathryne JAYSON, [place]   1 year ago Asthma exacerbation, mild    Primary Care & Sports Medicine at [facility], Leita DEL, MD       Future Appointments             In 4 months Justus, Leita DEL, MD [facility] Primary Care & Sports Medicine at [facility], [facility]

## 2023-06-23 ENCOUNTER — Encounter: Payer: Medicare PPO | Admitting: Internal Medicine

## 2023-08-09 ENCOUNTER — Other Ambulatory Visit: Payer: Self-pay | Admitting: Internal Medicine

## 2023-08-09 DIAGNOSIS — I10 Essential (primary) hypertension: Secondary | ICD-10-CM

## 2023-08-11 NOTE — Telephone Encounter (Signed)
 Requested medication (s) are due for refill today: yes   Requested medication (s) are on the active medication list: yes   Last refill:  05/12/23 #90 0 refills   Future visit scheduled: yes in 2 month  Notes to clinic:  protocol failed last labs 10/11/22. Do you want to refill Rx?     Requested Prescriptions  Pending Prescriptions Disp Refills   lisinopril (ZESTRIL) 40 MG tablet [Pharmacy Med Name: LISINOPRIL 40 MG TABLET] 90 tablet 0    Sig: TAKE 1 TABLET BY MOUTH EVERY DAY     Cardiovascular:  ACE Inhibitors Failed - 08/11/2023  1:52 PM      Failed - Cr in normal range and within 180 days    Creatinine, Ser  Date Value Ref Range Status  10/11/2022 0.97 0.57 - 1.00 mg/dL Final         Failed - K in normal range and within 180 days    Potassium  Date Value Ref Range Status  10/11/2022 4.8 3.5 - 5.2 mmol/L Final         Passed - Patient is not pregnant      Passed - Last BP in normal range    BP Readings from Last 1 Encounters:  04/15/23 118/78         Passed - Valid encounter within last 6 months    Recent Outpatient Visits           3 months ago Benign hypertension   [place] [facility] at [facility], Nyoka Cowden, MD   7 months ago Acute COVID-19   [facility] at [facility], Melton Alar, [place]   10 months ago Annual physical exam   [facility] at [facility], Nyoka Cowden, MD   1 year ago Asthma exacerbation, mild   [place] [facility] at [facility], [facility]   1 year ago Asthma exacerbation, mild   [place] [facility] at [facility], Nyoka Cowden, MD       Future Appointments             In 2 months Judithann Graves, Nyoka Cowden, MD [facility] at [facility], [facility]

## 2023-09-11 ENCOUNTER — Other Ambulatory Visit: Payer: Self-pay | Admitting: Internal Medicine

## 2023-09-11 DIAGNOSIS — F411 Generalized anxiety disorder: Secondary | ICD-10-CM

## 2023-09-11 DIAGNOSIS — I10 Essential (primary) hypertension: Secondary | ICD-10-CM

## 2023-09-12 NOTE — Telephone Encounter (Signed)
 Requested Prescriptions  Pending Prescriptions Disp Refills   sertraline (ZOLOFT) 50 MG tablet [Pharmacy Med Name: SERTRALINE HCL 50 MG TABLET] 90 tablet 0    Sig: TAKE 1 TABLET BY MOUTH EVERY DAY     Psychiatry:  Antidepressants - SSRI - sertraline F

## 2023-09-24 ENCOUNTER — Ambulatory Visit: Payer: Medicare PPO | Admitting: Emergency Medicine

## 2023-09-24 VITALS — Ht 64.0 in | Wt 160.0 lb

## 2023-09-24 DIAGNOSIS — Z0000 Encounter for general adult medical examination without abnormal findings: Secondary | ICD-10-CM | POA: Diagnosis not present

## 2023-09-24 NOTE — Progress Notes (Signed)
 Subjective:   Sarah Hernandez is a 73 y.o. who presents for a Medicare Wellness preventive visit.  Visit Complete: Virtual I connected with  Donnisha Johnny Tapper on 09/24/23 by a audio enabled telemedicine application and verified that I am speaking with the correct person using two identifiers.  Patient Location: Home  Provider Location: Home Office  I discussed the limitations of evaluation and management by telemedicine. The patient expressed understanding and agreed to proceed.  Vital Signs: Because this visit was a virtual/telehealth visit, some criteria may be missing or patient reported. Any vitals not documented were not able to be obtained and vitals that have been documented are patient reported.  VideoDeclined- This patient declined Librarian, academic. Therefore the visit was completed with audio only.  Persons Participating in Visit: Patient.  AWV Questionnaire: Yes: Patient Medicare AWV questionnaire was completed by the patient on 09/17/23; I have confirmed that all information answered by patient is correct and no changes since this date.  Cardiac Risk Factors include: advanced age (>25men, >82 women);dyslipidemia;hypertension     Objective:    Today's Vitals   09/24/23 0806  Weight: 160 lb (72.6 kg)  Height: 5\' 4"  (1.626 m)   Body mass index is 27.46 kg/m.     09/24/2023    8:20 AM 09/18/2022    8:19 AM 09/12/2021    8:31 AM 09/11/2020    8:35 AM 09/08/2019    8:22 AM 07/09/2019    1:12 PM 09/07/2018    9:37 AM  Advanced Directives  Does Patient Have a Medical Advance Directive? Yes No Yes Yes Yes Yes Yes  Type of Estate agent of [place];Living will  Healthcare Power of [place];Living will Healthcare Power of [place];Living will Healthcare Power of [place];Living will Healthcare Power of [place];Living will Healthcare Power of [place];Living will  Does patient want to make changes to medical advance directive?  No - Patient declined        Copy of Healthcare Power of Attorney in Chart? No - copy requested  No - copy requested No - copy requested No - copy requested  No - copy requested  Would patient like information on creating a medical advance directive?  No - Patient declined         Current Medications (verified) Outpatient Encounter Medications as of 09/24/2023  Medication Sig   albuterol  (PROVENTIL ) (2.5 MG/3ML) 0.083% nebulizer solution Inhale 3 mLs into the lungs 4 (four) times daily as needed. Reported on 08/16/2015   albuterol  (VENTOLIN  HFA) 108 (90 Base) MCG/ACT inhaler TAKE 2 PUFFS BY MOUTH EVERY 6 HOURS AS NEEDED FOR WHEEZE OR SHORTNESS OF BREATH   atorvastatin  (LIPITOR) 40 MG tablet TAKE 1 TABLET BY MOUTH EVERY DAY   Budeson-Glycopyrrol-Formoterol (BREZTRI  AEROSPHERE) 160-9-4.8 MCG/ACT AERO Inhale 2 puffs into the lungs 2 (two) times daily.   cetirizine (ZYRTEC) 10 MG tablet Take 10 mg by mouth daily as needed for allergies.   Cyanocobalamin (VITAMIN B-12 PO) Place 1 drop under the tongue daily.   fluticasone  (FLONASE) 50 MCG/ACT nasal spray Place 1 spray into both nostrils daily as needed for allergies or rhinitis.   ipratropium-albuterol  (DUONEB) 0.5-2.5 (3) MG/3ML SOLN Take 3 mLs by nebulization every 6 (six) hours as needed.   lisinopril  (ZESTRIL ) 40 MG tablet TAKE 1 TABLET BY MOUTH EVERY DAY   metoprolol  succinate (TOPROL -XL) 50 MG 24 hr tablet TAKE 1 TAB IN THE MORNING AND 1/2 TAB IN THE EVENING   omeprazole  (PRILOSEC) 20 MG capsule  TAKE 1 CAPSULE BY MOUTH EVERY DAY   sertraline  (ZOLOFT ) 50 MG tablet TAKE 1 TABLET BY MOUTH EVERY DAY   VITAMIN D PO Take 2,000 Units by mouth daily.   VITAMIN E PO Take 1 tablet by mouth daily.   nitroGLYCERIN  (NITROSTAT ) 0.4 MG SL tablet Place 1 tablet (0.4 mg total) under the tongue every 5 (five) minutes as needed for chest pain.   No facility-administered encounter medications on file as of 09/24/2023.    Allergies (verified) Amlodipine and  Flagyl  [metronidazole ]   History: Past Medical History:  Diagnosis Date   Allergy Seasonal   Anxiety Meds   Asthma Meds as needed   Diverticulosis    GERD (gastroesophageal reflux disease)    History of basal cell cancer 09/13/2019   Hyperlipidemia    Hypertension    Irritable bowel    Overactive bladder    Past Surgical History:  Procedure Laterality Date   ABDOMINAL HYSTERECTOMY  1997   APPENDECTOMY  1997   COLONOSCOPY  2015   diverticulosis   PARTIAL HYSTERECTOMY  1979   bleeding from fibroids   TONSILLECTOMY  1966   UPPER GASTROINTESTINAL ENDOSCOPY  09/2014   gastritis/esoph; no Barrett's   Family History  Problem Relation Age of Onset   Dementia Mother    Varicose Veins Mother    CAD Father    Heart disease Father    CAD Brother    Stroke Brother    Heart disease Son    Social History   Socioeconomic History   Marital status: Married    Spouse name: Deloria Fetch   Number of children: 2   Years of education: 12+   Highest education level: Some college, no degree  Occupational History   Occupation: full time    Comment: Optician, dispensing  Tobacco Use   Smoking status: Never   Smokeless tobacco: Never  Vaping Use   Vaping status: Never Used  Substance and Sexual Activity   Alcohol use: Yes    Alcohol/week: 3.0 - 4.0 standard drinks of alcohol    Types: 3 - 4 Glasses of wine per week    Comment: 1 glass of wine 3-4 times per week   Drug use: Not Currently   Sexual activity: Yes    Partners: Male  Other Topics Concern   Not on file  Social History Narrative   09/24/23 working full time at [employer]   Social Drivers of Health   Financial Resource Strain: Low Risk  (09/24/2023)   Overall Financial Resource Strain (CARDIA)    Difficulty of Paying Living Expenses: Not hard at all  Food Insecurity: No Food Insecurity (09/24/2023)   Hunger Vital Sign    Worried About Running Out of Food in the Last Year: Never true    Ran Out of  Food in the Last Year: Never true  Transportation Needs: No Transportation Needs (09/24/2023)   PRAPARE - Administrator, Civil Service (Medical): No    Lack of Transportation (Non-Medical): No  Physical Activity: Inactive (09/24/2023)   Exercise Vital Sign    Days of Exercise per Week: 0 days    Minutes of Exercise per Session: 0 min  Stress: No Stress Concern Present (09/24/2023)   [employer] of Occupational Health - Occupational Stress Questionnaire    Feeling of Stress : Not at all  Social Connections: Moderately Integrated (09/24/2023)   Social Connection and Isolation Panel [NHANES]    Frequency of Communication with Friends and  Family: Twice a week    Frequency of Social Gatherings with Friends and Family: More than three times a week    Attends Religious Services: 1 to 4 times per year    Active Member of [employer] or Organizations: No    Attends Engineer, structural: Never    Marital Status: Married    Tobacco Counseling Counseling given: Not Answered    Clinical Intake:  Pre-visit preparation completed: Yes  Pain : No/denies pain     BMI - recorded: 27.46 Nutritional Status: BMI 25 -29 Overweight Nutritional Risks: None Diabetes: No  Lab Results  Component Value Date   HGBA1C 5.6 10/11/2022     How often do you need to have someone help you when you read instructions, pamphlets, or other written materials from your doctor or pharmacy?: 1 - Never  Interpreter Needed?: No  Information entered by :: Jaunita Messier, CMA   Activities of Daily Living     09/24/2023    8:08 AM 09/17/2023    6:18 PM  In your present state of health, do you have any difficulty performing the following activities:  Hearing? 0 0  Vision? 1 1  Comment sometimes, has appt for cataract surgery   Difficulty concentrating or making decisions? 0 0  Walking or climbing stairs? 0 0  Dressing or bathing? 0 0  Doing errands, shopping? 0 0  Preparing Food and eating  ? N N  Using the Toilet? N N  In the past six months, have you accidently leaked urine? N N  Do you have problems with loss of bowel control? N N  Managing your Medications? N N  Managing your Finances? N N  Housekeeping or managing your Housekeeping? N N    Patient Care Team: Sheron Dixons, MD as PCP - General (Internal Medicine) [place], [facility])  Indicate any recent Medical Services you may have received from other than [facility] providers in the past year (date may be approximate).     Assessment:   This is a routine wellness examination for [place].  Hearing/Vision screen Hearing Screening - Comments:: Denies hearing loss Vision Screening - Comments:: Gets eye exams, [facility] [place]   Goals Addressed             This Visit's Progress    Patient Stated       Maintain current health, stay healthy       Depression Screen     09/24/2023    8:18 AM 04/15/2023    8:26 AM 01/13/2023    2:46 PM 10/11/2022    7:57 AM 09/18/2022    8:17 AM 01/31/2022    2:19 PM 01/23/2022    4:08 PM  PHQ 2/9 Scores  PHQ - 2 Score 0 0 0 0 0 0 0  PHQ- 9 Score 1 0 2 0 0 3 0    Fall Risk     09/24/2023    8:21 AM 09/17/2023    6:18 PM 04/15/2023    8:26 AM 01/13/2023    2:46 PM 09/18/2022    8:19 AM  Fall Risk   Falls in the past year? 0 0 0 0 0  Number falls in past yr: 0 0 0 0 0  Injury with Fall? 0 0 0 0 0  Risk for fall due to : No Fall Risks  No Fall Risks No Fall Risks No Fall Risks  Follow up Falls prevention discussed;Falls evaluation completed  Falls evaluation completed  Falls evaluation completed Falls prevention discussed;Falls evaluation completed    MEDICARE RISK AT HOME:  Medicare Risk at Home Any stairs in or around the home?: Yes If so, are there any without handrails?: No Home free of loose throw rugs in walkways, pet beds, electrical cords, etc?: Yes Adequate lighting in your home to reduce risk of falls?: Yes Life alert?: No Use of a  cane, walker or w/c?: No Grab bars in the bathroom?: No Shower chair or bench in shower?: No Elevated toilet seat or a handicapped toilet?: No  TIMED UP AND GO:  Was the test performed?  No  Cognitive Function: 6CIT completed        09/24/2023    8:21 AM 09/18/2022    8:25 AM 09/08/2019    8:26 AM 09/07/2018    9:41 AM 08/27/2017    8:26 AM  6CIT Screen  What Year? 0 points 0 points 0 points 0 points 0 points  What month? 0 points 0 points 0 points 0 points 0 points  What time? 0 points 0 points 0 points 0 points 0 points  Count back from 20 0 points 0 points 0 points 0 points 0 points  Months in reverse 0 points 0 points 0 points 0 points 0 points  Repeat phrase 0 points 0 points 0 points 0 points 0 points  Total Score 0 points 0 points 0 points 0 points 0 points    Immunizations Immunization History  Administered Date(s) Administered   Fluad Quad(high Dose 65+) 01/20/2019, 02/16/2020, 01/14/2022   Influenza, High Dose Seasonal PF 02/02/2018, 02/11/2021, 01/04/2023   Influenza,inj,Quad PF,6+ Mos 03/08/2015, 02/14/2016, 01/28/2017   Influenza-Unspecified 03/08/2015, 02/14/2016, 01/28/2017   PFIZER Comirnaty(Gray Top)Covid-19 Tri-Sucrose Vaccine 03/08/2020, 09/20/2020   PFIZER(Purple Top)SARS-COV-2 Vaccination 07/17/2019, 08/08/2019   PNEUMOCOCCAL CONJUGATE-20 09/07/2022   Pfizer Covid-19 Vaccine Bivalent Booster 44yrs & up 02/11/2021, 10/28/2021   Pfizer(Comirnaty)Fall Seasonal Vaccine 12 years and older 04/15/2023   Pneumococcal Conjugate-13 08/10/2014   Pneumococcal Polysaccharide-23 06/15/2013   Rsv, Bivalent, Protein Subunit Rsvpref,pf Pattricia Bores) 05/12/2023   Tdap 04/15/2007, 05/28/2008, 10/28/2018   Zoster Recombinant(Shingrix ) 03/07/2019, 05/07/2019   Zoster, Live 05/28/2012    Screening Tests Health Maintenance  Topic Date Due   Colonoscopy  10/20/2023   COVID-19 Vaccine (8 - Pfizer risk 2024-25 season) 10/13/2023   MAMMOGRAM  11/18/2023   INFLUENZA VACCINE   12/26/2023   Medicare Annual Wellness (AWV)  09/23/2024   DEXA SCAN  11/15/2026   DTaP/Tdap/Td (4 - Td or Tdap) 10/27/2028   Pneumonia Vaccine 77+ Years old  Completed   Hepatitis C Screening  Completed   Zoster Vaccines- Shingrix   Completed   HPV VACCINES  Aged Out   Meningococcal B Vaccine  Aged Out    Health Maintenance  Health Maintenance Due  Topic Date Due   Colonoscopy  10/20/2023   Health Maintenance Items Addressed: See Nurse Notes  Additional Screening:  Vision Screening: Recommended annual ophthalmology exams for early detection of glaucoma and other disorders of the eye.  Dental Screening: Recommended annual dental exams for proper oral hygiene  Community Resource Referral / Chronic Care Management: CRR required this visit?  No   CCM required this visit?  No     Plan:     I have personally reviewed and noted the following in the patient's chart:   Medical and social history Use of alcohol, tobacco or illicit drugs  Current medications and supplements including opioid prescriptions. Patient is not currently taking opioid prescriptions. Functional ability and  status Nutritional status Physical activity Advanced directives List of other physicians Hospitalizations, surgeries, and ER visits in previous 12 months Vitals Screenings to include cognitive, depression, and falls Referrals and appointments  In addition, I have reviewed and discussed with patient certain preventive protocols, quality metrics, and best practice recommendations. A written personalized care plan for preventive services as well as general preventive health recommendations were provided to patient.     Jaunita Messier, CMA   09/24/2023   After Visit Summary: (MyChart) Due to this being a telephonic visit, the after visit summary with patients personalized plan was offered to patient via MyChart   Notes:  MMG is scheduled in June at [facility] Declined colonoscopy due to  age

## 2023-09-24 NOTE — Patient Instructions (Addendum)
 Ms. Sarah Hernandez , Thank you for taking time to come for your Medicare Wellness Visit. I appreciate your ongoing commitment to your health goals. Please review the following plan we discussed and let me know if I can assist you in the future.   Referrals/Orders/Follow-Ups/Clinician Recommendations: Keep up the good work!  This is a list of the screening recommended for you and due dates:  Health Maintenance  Topic Date Due   Colon Cancer Screening  10/20/2023   COVID-19 Vaccine (8 - Pfizer risk 2024-25 season) 10/13/2023   Mammogram  11/18/2023   Flu Shot  12/26/2023   Medicare Annual Wellness Visit  09/23/2024   DEXA scan (bone density measurement)  11/15/2026   DTaP/Tdap/Td vaccine (4 - Td or Tdap) 10/27/2028   Pneumonia Vaccine  Completed   Hepatitis C Screening  Completed   Zoster (Shingles) Vaccine  Completed   HPV Vaccine  Aged Out   Meningitis B Vaccine  Aged Out    Advanced directives: (Copy Requested) Please bring a copy of your health care power of attorney and living will to the office to be added to your chart at your convenience. You can mail to [facility] [address] [place], [place] [address] or email to [email or web address]  Next Medicare Annual Wellness Visit scheduled for next year: Yes, 09/29/24 @ 8:10am (phone visit)

## 2023-10-13 ENCOUNTER — Ambulatory Visit (INDEPENDENT_AMBULATORY_CARE_PROVIDER_SITE_OTHER): Payer: Self-pay | Admitting: Internal Medicine

## 2023-10-13 ENCOUNTER — Encounter: Payer: Self-pay | Admitting: Internal Medicine

## 2023-10-13 VITALS — BP 150/80 | HR 68 | Ht 64.0 in | Wt 159.2 lb

## 2023-10-13 DIAGNOSIS — I10 Essential (primary) hypertension: Secondary | ICD-10-CM | POA: Diagnosis not present

## 2023-10-13 DIAGNOSIS — K2100 Gastro-esophageal reflux disease with esophagitis, without bleeding: Secondary | ICD-10-CM

## 2023-10-13 DIAGNOSIS — F411 Generalized anxiety disorder: Secondary | ICD-10-CM | POA: Diagnosis not present

## 2023-10-13 DIAGNOSIS — Z1231 Encounter for screening mammogram for malignant neoplasm of breast: Secondary | ICD-10-CM

## 2023-10-13 DIAGNOSIS — E782 Mixed hyperlipidemia: Secondary | ICD-10-CM | POA: Diagnosis not present

## 2023-10-13 DIAGNOSIS — Z1211 Encounter for screening for malignant neoplasm of colon: Secondary | ICD-10-CM

## 2023-10-13 DIAGNOSIS — Z0000 Encounter for general adult medical examination without abnormal findings: Secondary | ICD-10-CM | POA: Diagnosis not present

## 2023-10-13 DIAGNOSIS — J4520 Mild intermittent asthma, uncomplicated: Secondary | ICD-10-CM

## 2023-10-13 MED ORDER — HYDROCHLOROTHIAZIDE 12.5 MG PO CAPS: 12.5000 mg | ORAL_CAPSULE | Freq: Every day | ORAL | 0 refills | Status: DC

## 2023-10-13 NOTE — Assessment & Plan Note (Signed)
 Only using nebulizer or albuterol  as needed.  Not on any daily prevention.

## 2023-10-13 NOTE — Progress Notes (Signed)
 Date:  10/13/2023   Name:  Sarah Hernandez   DOB:  [birth year]   MRN:  [id number]   Chief Complaint: Annual Exam Ruhama Lehew Sorlie is a 73 y.o. female who presents today for her Complete Annual Exam. She feels well. She reports exercising walks everyday, 45 minutes. She reports she is sleeping well. Breast complaints none.  Health Maintenance  Topic Date Due   COVID-19 Vaccine (8 - Pfizer risk 2024-25 season) 10/13/2023   Colon Cancer Screening  10/20/2023   Mammogram  11/18/2023   Flu Shot  12/26/2023   Medicare Annual Wellness Visit  09/23/2024   DEXA scan (bone density measurement)  11/15/2026   DTaP/Tdap/Td vaccine (4 - Td or Tdap) 10/27/2028   Pneumonia Vaccine  Completed   Hepatitis C Screening  Completed   Zoster (Shingles) Vaccine  Completed   HPV Vaccine  Aged Out   Meningitis B Vaccine  Aged Out    Hypertension This is a chronic problem. The problem is controlled. Associated symptoms include anxiety. Pertinent negatives include no chest pain, headaches, palpitations or shortness of breath. Past treatments include ACE inhibitors and beta blockers. The current treatment provides significant improvement.  Hyperlipidemia This is a chronic problem. Pertinent negatives include no chest pain, myalgias or shortness of breath. Current antihyperlipidemic treatment includes statins.  Anxiety Presents for follow-up visit. Patient reports no chest pain, dizziness, nervous/anxious behavior, palpitations or shortness of breath.   Compliance with medications is 76-100%.  Gastroesophageal Reflux She complains of heartburn. She reports no abdominal pain, no chest pain, no coughing or no wheezing. This is a recurrent problem. Pertinent negatives include no fatigue. She has tried a PPI for the symptoms.    Review of Systems  Constitutional:  Negative for fatigue and unexpected weight change.  HENT:  Negative for trouble swallowing.   Eyes:  Negative for visual disturbance.  Respiratory:   Negative for cough, chest tightness, shortness of breath and wheezing.   Cardiovascular:  Negative for chest pain, palpitations and leg swelling.  Gastrointestinal:  Positive for heartburn. Negative for abdominal pain, constipation and diarrhea.  Genitourinary:  Negative for dysuria.  Musculoskeletal:  Negative for arthralgias and myalgias.  Allergic/Immunologic: Positive for environmental allergies.  Neurological:  Negative for dizziness, weakness, light-headedness and headaches.  Psychiatric/Behavioral:  Negative for dysphoric mood and sleep disturbance. The patient is not nervous/anxious.      Lab Results  Component Value Date   NA 140 10/11/2022   K 4.8 10/11/2022   CO2 22 10/11/2022   GLUCOSE 90 10/11/2022   BUN 16 10/11/2022   CREATININE 0.97 10/11/2022   CALCIUM  9.7 10/11/2022   EGFR 61 10/11/2022   GFRNONAA 60 09/13/2019   Lab Results  Component Value Date   CHOL 172 10/11/2022   HDL 63 10/11/2022   LDLCALC 88 10/11/2022   TRIG 119 10/11/2022   CHOLHDL 2.7 10/11/2022   Lab Results  Component Value Date   TSH 2.720 10/11/2022   Lab Results  Component Value Date   HGBA1C 5.6 10/11/2022   Lab Results  Component Value Date   WBC 6.6 10/11/2022   HGB 12.1 10/11/2022   HCT 36.0 10/11/2022   MCV 89 10/11/2022   PLT 255 10/11/2022   Lab Results  Component Value Date   ALT 22 10/11/2022   AST 16 10/11/2022   GGT 11 02/25/2019   ALKPHOS 89 10/11/2022   BILITOT 0.4 10/11/2022   No results found for: "25OHVITD2", "25OHVITD3", "VD25OH"   Patient Active  Problem List   Diagnosis Date Noted   History of basal cell cancer 09/13/2019   Generalized anxiety disorder 09/09/2018   OAB (overactive bladder) 08/21/2016   Esophagitis, reflux 10/05/2014   Hemorrhoids, internal 10/05/2014   Hyperlipidemia, mixed 10/05/2014   Benign hypertension 10/05/2014   Irritable bowel syndrome with constipation 10/05/2014   Mild intermittent asthma without complication 10/05/2014    Allergic rhinitis, seasonal 10/05/2014   DD (diverticular disease) 12/13/2013    Allergies  Allergen Reactions   Amlodipine Swelling   Flagyl  [Metronidazole ] Nausea Only    Past Surgical History:  Procedure Laterality Date   ABDOMINAL HYSTERECTOMY  1997   APPENDECTOMY  1997   COLONOSCOPY  2015   diverticulosis   PARTIAL HYSTERECTOMY  1979   bleeding from fibroids   TONSILLECTOMY  1966   UPPER GASTROINTESTINAL ENDOSCOPY  09/2014   gastritis/esoph; no Barrett's    Social History   Tobacco Use   Smoking status: Never   Smokeless tobacco: Never  Vaping Use   Vaping status: Never Used  Substance Use Topics   Alcohol use: Yes    Alcohol/week: 3.0 - 4.0 standard drinks of alcohol    Types: 3 - 4 Glasses of wine per week    Comment: 1 glass of wine 3-4 times per week   Drug use: Not Currently     Medication list has been reviewed and updated.  Current Meds  Medication Sig   albuterol  (PROVENTIL ) (2.5 MG/3ML) 0.083% nebulizer solution Inhale 3 mLs into the lungs 4 (four) times daily as needed. Reported on 08/16/2015   albuterol  (VENTOLIN  HFA) 108 (90 Base) MCG/ACT inhaler TAKE 2 PUFFS BY MOUTH EVERY 6 HOURS AS NEEDED FOR WHEEZE OR SHORTNESS OF BREATH   atorvastatin  (LIPITOR) 40 MG tablet TAKE 1 TABLET BY MOUTH EVERY DAY   cetirizine (ZYRTEC) 10 MG tablet Take 10 mg by mouth daily as needed for allergies.   Cyanocobalamin (VITAMIN B-12 PO) Place 1 drop under the tongue daily.   fluticasone  (FLONASE) 50 MCG/ACT nasal spray Place 1 spray into both nostrils daily as needed for allergies or rhinitis.   hydrochlorothiazide  (MICROZIDE ) 12.5 MG capsule Take 1 capsule (12.5 mg total) by mouth daily.   ipratropium-albuterol  (DUONEB) 0.5-2.5 (3) MG/3ML SOLN Take 3 mLs by nebulization every 6 (six) hours as needed.   lisinopril  (ZESTRIL ) 40 MG tablet TAKE 1 TABLET BY MOUTH EVERY DAY   metoprolol  succinate (TOPROL -XL) 50 MG 24 hr tablet TAKE 1 TAB IN THE MORNING AND 1/2 TAB IN THE  EVENING (Patient taking differently: 75 mg. TAKE 1 TAB IN THE MORNING AND 1/2 TAB IN THE EVENING)   naproxen sodium (ALEVE) 220 MG tablet Take 220 mg by mouth 2 (two) times daily as needed.   omeprazole  (PRILOSEC) 20 MG capsule TAKE 1 CAPSULE BY MOUTH EVERY DAY   sertraline  (ZOLOFT ) 50 MG tablet TAKE 1 TABLET BY MOUTH EVERY DAY   VITAMIN D PO Take 2,000 Units by mouth daily.   VITAMIN E PO Take 1 tablet by mouth daily.   [DISCONTINUED] Budeson-Glycopyrrol-Formoterol (BREZTRI  AEROSPHERE) 160-9-4.8 MCG/ACT AERO Inhale 2 puffs into the lungs 2 (two) times daily.       10/13/2023    8:42 AM 04/15/2023    8:26 AM 01/13/2023    2:46 PM 10/11/2022    7:57 AM  GAD 7 : Generalized Anxiety Score  Nervous, Anxious, on Edge 1 0 0 0  Control/stop worrying 0 0 0 0  Worry too much - different things 0 0 0  0  Trouble relaxing 0 0 0 0  Restless 0 0 0 0  Easily annoyed or irritable 0 0 0 0  Afraid - awful might happen 0 0 0 0  Total GAD 7 Score 1 0 0 0  Anxiety Difficulty Not difficult at all Not difficult at all Not difficult at all Not difficult at all       10/13/2023    8:41 AM 09/24/2023    8:18 AM 04/15/2023    8:26 AM  Depression screen PHQ 2/9  Decreased Interest 0 0 0  Down, Depressed, Hopeless 0 0 0  PHQ - 2 Score 0 0 0  Altered sleeping 0 0 0  Tired, decreased energy 0 1 0  Change in appetite 0 0 0  Feeling bad or failure about yourself  0 0 0  Trouble concentrating 0 0 0  Moving slowly or fidgety/restless 0 0 0  Suicidal thoughts 0 0 0  PHQ-9 Score 0 1 0  Difficult doing work/chores Not difficult at all Not difficult at all Not difficult at all    BP Readings from Last 3 Encounters:  10/13/23 (!) 150/80  04/15/23 118/78  10/11/22 128/79    Physical Exam Vitals and nursing note reviewed.  Constitutional:      General: She is not in acute distress.    Appearance: She is well-developed.  HENT:     Head: Normocephalic and atraumatic.     Right Ear: Tympanic membrane  normal.     Left Ear: Tympanic membrane normal.     Mouth/Throat:     Mouth: Mucous membranes are moist.  Cardiovascular:     Rate and Rhythm: Normal rate and regular rhythm.     Heart sounds: No murmur heard. Pulmonary:     Effort: Pulmonary effort is normal. No respiratory distress.     Breath sounds: No wheezing or rhonchi.  Musculoskeletal:     Right lower leg: No edema.     Left lower leg: No edema.  Skin:    General: Skin is warm and dry.     Capillary Refill: Capillary refill takes less than 2 seconds.     Findings: No rash.  Neurological:     General: No focal deficit present.     Mental Status: She is alert and oriented to person, place, and time.     Gait: Gait normal.  Psychiatric:        Mood and Affect: Mood normal.        Behavior: Behavior normal.     Wt Readings from Last 3 Encounters:  10/13/23 159 lb 3 oz (72.2 kg)  09/24/23 160 lb (72.6 kg)  04/15/23 161 lb 6.4 oz (73.2 kg)    BP (!) 150/80   Pulse 68   Ht 5\' 4"  (1.626 m)   Wt 159 lb 3 oz (72.2 kg)   SpO2 98%   BMI 27.32 kg/m   Assessment and Plan:  Problem List Items Addressed This Visit       Unprioritized   Esophagitis, reflux (Chronic)   Reflux symptoms are controlled on omeprazole . Patient denies red flag symptoms - no melena, weight loss, dysphagia.       Relevant Orders   CBC with Differential/Platelet   Hyperlipidemia, mixed (Chronic)   LDL is  Lab Results  Component Value Date   LDLCALC 88 10/11/2022   Current regimen is atorvastatin .  No medication side effects noted. Goal LDL is <100.       Relevant Medications  hydrochlorothiazide  (MICROZIDE ) 12.5 MG capsule   Other Relevant Orders   Lipid panel   Benign hypertension (Chronic)   Blood pressure is not well controlled.  Current medications lisinopril  and metoprolol . Will add hydrochlorothiazide  12.5 mg Will continue efforts to limit dietary sodium. Recheck in 2 months.       Relevant Medications    hydrochlorothiazide  (MICROZIDE ) 12.5 MG capsule   Other Relevant Orders   CBC with Differential/Platelet   Comprehensive metabolic panel with GFR   TSH   Urinalysis, Routine w reflex microscopic   Mild intermittent asthma without complication (Chronic)   Only using nebulizer or albuterol  as needed.  Not on any daily prevention.      Generalized anxiety disorder (Chronic)   Relevant Orders   TSH   Other Visit Diagnoses       Annual physical exam    -  Primary   up to date on screenings and immunizations continue healthy diet and exercise as able     Encounter for screening mammogram for breast cancer       scheduled at [facility]     Colon cancer screening       due for 10 yr repeat but she declines based on age will follow up if any symptoms       Return in about 2 months (around 12/13/2023) for HTN.    Sheron Dixons, MD [facility] [place]

## 2023-10-13 NOTE — Assessment & Plan Note (Addendum)
 Blood pressure is not well controlled.  Current medications lisinopril  and metoprolol . Will add hydrochlorothiazide  12.5 mg Will continue efforts to limit dietary sodium. Recheck in 2 months.

## 2023-10-13 NOTE — Assessment & Plan Note (Signed)
 LDL is  Lab Results  Component Value Date   LDLCALC 88 10/11/2022   Current regimen is atorvastatin .  No medication side effects noted. Goal LDL is <100.

## 2023-10-13 NOTE — Assessment & Plan Note (Signed)
 Reflux symptoms are controlled on omeprazole. Patient denies red flag symptoms - no melena, weight loss, dysphagia.

## 2023-10-14 ENCOUNTER — Ambulatory Visit: Payer: Self-pay | Admitting: Internal Medicine

## 2023-10-14 LAB — CBC WITH DIFFERENTIAL/PLATELET
Basophils Absolute: 0.1 10*3/uL (ref 0.0–0.2)
Basos: 1 %
EOS (ABSOLUTE): 0.3 10*3/uL (ref 0.0–0.4)
Eos: 4 %
Hematocrit: 38.7 % (ref 34.0–46.6)
Hemoglobin: 12.6 g/dL (ref 11.1–15.9)
Immature Grans (Abs): 0 10*3/uL (ref 0.0–0.1)
Immature Granulocytes: 1 %
Lymphocytes Absolute: 2 10*3/uL (ref 0.7–3.1)
Lymphs: 27 %
MCH: 30.4 pg (ref 26.6–33.0)
MCHC: 32.6 g/dL (ref 31.5–35.7)
MCV: 94 fL (ref 79–97)
Monocytes Absolute: 0.4 10*3/uL (ref 0.1–0.9)
Monocytes: 5 %
Neutrophils Absolute: 4.6 10*3/uL (ref 1.4–7.0)
Neutrophils: 62 %
Platelets: 277 10*3/uL (ref 150–450)
RBC: 4.14 x10E6/uL (ref 3.77–5.28)
RDW: 12.7 % (ref 11.7–15.4)
WBC: 7.3 10*3/uL (ref 3.4–10.8)

## 2023-10-14 LAB — LIPID PANEL
Chol/HDL Ratio: 3.1 ratio (ref 0.0–4.4)
Cholesterol, Total: 178 mg/dL (ref 100–199)
HDL: 57 mg/dL (ref >39)
LDL Chol Calc (NIH): 98 mg/dL (ref 0–99)
Triglycerides: 133 mg/dL (ref 0–149)
VLDL Cholesterol Cal: 23 mg/dL (ref 5–40)

## 2023-10-14 LAB — COMPREHENSIVE METABOLIC PANEL WITH GFR
ALT: 19 IU/L (ref 0–32)
AST: 17 IU/L (ref 0–40)
Albumin: 4.2 g/dL (ref 3.8–4.8)
Alkaline Phosphatase: 100 IU/L (ref 44–121)
BUN/Creatinine Ratio: 16 (ref 12–28)
BUN: 18 mg/dL (ref 8–27)
Bilirubin Total: 0.4 mg/dL (ref 0.0–1.2)
CO2: 23 mmol/L (ref 20–29)
Calcium: 9.1 mg/dL (ref 8.7–10.3)
Chloride: 106 mmol/L (ref 96–106)
Creatinine, Ser: 1.15 mg/dL — ABNORMAL HIGH (ref 0.57–1.00)
Globulin, Total: 2.4 g/dL (ref 1.5–4.5)
Glucose: 86 mg/dL (ref 70–99)
Potassium: 5.2 mmol/L (ref 3.5–5.2)
Sodium: 140 mmol/L (ref 134–144)
Total Protein: 6.6 g/dL (ref 6.0–8.5)
eGFR: 50 mL/min/{1.73_m2} — ABNORMAL LOW (ref >59)

## 2023-10-14 LAB — URINALYSIS, ROUTINE W REFLEX MICROSCOPIC
Glucose, UA: NEGATIVE
Nitrite, UA: NEGATIVE
RBC, UA: NEGATIVE
Specific Gravity, UA: >=1.03 — AB (ref 1.005–1.030)
Urobilinogen, Ur: 1 mg/dL (ref 0.2–1.0)
pH, UA: 5.5 (ref 5.0–7.5)

## 2023-10-14 LAB — MICROSCOPIC EXAMINATION
Bacteria, UA: NONE SEEN
Casts: NONE SEEN /LPF
Epithelial Cells (non renal): >10 /HPF — AB (ref 0–10)
RBC, Urine: NONE SEEN /HPF (ref 0–2)

## 2023-10-14 LAB — TSH: TSH: 5.06 u[IU]/mL — ABNORMAL HIGH (ref 0.450–4.500)

## 2023-10-24 ENCOUNTER — Ambulatory Visit: Payer: Self-pay

## 2023-10-24 NOTE — Telephone Encounter (Signed)
 Noted  Pt has a appt.  KP

## 2023-10-24 NOTE — Telephone Encounter (Signed)
 Chief Complaint:  -When her bladder "gets full, it feels really painful".   Symptoms:  Pain in her bladder- 4-5/10.   Onset  2 weeks ago    Patient denies  Fever, weakness, flank pain, urgency, frequency, concentrated urine, malodorous urine, difficulty initiating urinary stream.   Disposition: [ ] ED /[ ] Urgent Care (no appt availability in office) / [ X]Appointment(In office/virtual)/ [ ]  [facility]/ [ ] Home Care/ [ ] Refused Recommended Disposition /[ ] [facility]/ [ ]  Follow-up with PCP   Additional Notes:   Appointment scheduled for patient appropriately.  Patient educated on pertinent s/s that would warrant emergent help/call 911/ ED/UC.  Patient verbalized understanding and agrees with plan . No additional questions/concerns noted during the time of the call.     Complete triage note below:       Copied from CRM [id number]. Topic: Clinical - Red Word Triage >> Oct 24, 2023  3:33 PM Zipporah Him wrote: Red Word that prompted transfer to Nurse Triage: possible uti, pain Reason for Disposition  All other urine symptoms  Answer Assessment - Initial Assessment Questions 1. SYMPTOM: "What's the main symptom you're concerned about?" (e.g., frequency, incontinence)     --- When bladder is full, it feels painful    2. ONSET: "When did the  start?"     -----------  2 weeks ago     3. PAIN: "Is there any pain?" If Yes, ask: "How bad is it?" (Scale: 1-10; mild, moderate, severe)     --------------------- 3-4/10    4. CAUSE: "What do you think is causing the symptoms?"     -------- Denies    5. OTHER SYMPTOMS: "Do you have any other symptoms?" (e.g., blood in urine, fever, flank pain, pain with urination)     --- Constipated ( started new medication)      -- Drinking lots of water and cranberry juice  Protocols used: Urinary Symptoms-A-AH

## 2023-10-27 ENCOUNTER — Ambulatory Visit: Admitting: Family Medicine

## 2023-11-01 ENCOUNTER — Other Ambulatory Visit: Payer: Self-pay | Admitting: Internal Medicine

## 2023-11-01 DIAGNOSIS — E782 Mixed hyperlipidemia: Secondary | ICD-10-CM

## 2023-11-03 NOTE — Telephone Encounter (Signed)
 Requested Prescriptions  Pending Prescriptions Disp Refills   atorvastatin  (LIPITOR) 40 MG tablet [Pharmacy Med Name: ATORVASTATIN  40 MG TABLET] 90 tablet 0    Sig: TAKE 1 TABLET BY MOUTH EVERY DAY     Cardiovascular:  Antilipid - Statins Failed - 11/03/2023  2:42 PM      Failed - Lipid Panel in normal range within the last 12 months    Cholesterol, Total  Date Value Ref Range Status  10/13/2023 178 100 - 199 mg/dL Final   LDL Chol Calc (NIH)  Date Value Ref Range Status  10/13/2023 98 0 - 99 mg/dL Final   HDL  Date Value Ref Range Status  10/13/2023 57 >39 mg/dL Final   Triglycerides  Date Value Ref Range Status  10/13/2023 133 0 - 149 mg/dL Final         Passed - Patient is not pregnant      Passed - Valid encounter within last 12 months    Recent Outpatient Visits           3 weeks ago Annual physical exam   [facility] at [facility], [place], [place]

## 2023-11-19 ENCOUNTER — Encounter: Payer: Self-pay | Admitting: Internal Medicine

## 2023-11-19 LAB — HM MAMMOGRAPHY

## 2023-12-04 ENCOUNTER — Other Ambulatory Visit: Payer: Self-pay | Admitting: Internal Medicine

## 2023-12-04 DIAGNOSIS — K2100 Gastro-esophageal reflux disease with esophagitis, without bleeding: Secondary | ICD-10-CM

## 2023-12-05 LAB — FECAL OCCULT BLOOD, GUAIAC: Fecal Occult Blood: NEGATIVE

## 2023-12-05 NOTE — Telephone Encounter (Signed)
 Requested Prescriptions  Pending Prescriptions Disp Refills   omeprazole  (PRILOSEC) 20 MG capsule [Pharmacy Med Name: OMEPRAZOLE  DR 20 MG CAPSULE] 90 capsule 3    Sig: TAKE 1 CAPSULE BY MOUTH EVERY DAY     Gastroenterology: Proton Pump Inhibitors Passed - 12/05/2023  2:26 PM      Passed - Valid encounter within last 12 months    Recent Outpatient Visits           1 month ago Annual physical exam   [facility] at [facility], Leita DEL, MD

## 2023-12-06 ENCOUNTER — Other Ambulatory Visit: Payer: Self-pay | Admitting: Internal Medicine

## 2023-12-06 DIAGNOSIS — I10 Essential (primary) hypertension: Secondary | ICD-10-CM

## 2023-12-06 DIAGNOSIS — F411 Generalized anxiety disorder: Secondary | ICD-10-CM

## 2023-12-08 NOTE — Telephone Encounter (Signed)
 Requested Prescriptions  Pending Prescriptions Disp Refills   metoprolol  succinate (TOPROL -XL) 50 MG 24 hr tablet [Pharmacy Med Name: METOPROLOL  SUCC ER 50 MG TAB] 135 tablet 0    Sig: TAKE 1 TAB IN THE MORNING AND 1/2 TAB IN THE EVENING     Cardiovascular:  Beta Blockers Failed - 12/08/2023  4:21 PM      Failed - Last BP in normal range    BP Readings from Last 1 Encounters:  10/13/23 (!) 150/80         Passed - Last Heart Rate in normal range    Pulse Readings from Last 1 Encounters:  10/13/23 68         Passed - Valid encounter within last 6 months    Recent Outpatient Visits           1 month ago Annual physical exam   [place] [facility] at [facility], Leita DEL, MD               sertraline  (ZOLOFT ) 50 MG tablet [Pharmacy Med Name: SERTRALINE  HCL 50 MG TABLET] 90 tablet 0    Sig: TAKE 1 TABLET BY MOUTH EVERY DAY     Psychiatry:  Antidepressants - SSRI - sertraline  Passed - 12/08/2023  4:21 PM      Passed - AST in normal range and within 360 days    AST  Date Value Ref Range Status  10/13/2023 17 0 - 40 IU/L Final         Passed - ALT in normal range and within 360 days    ALT  Date Value Ref Range Status  10/13/2023 19 0 - 32 IU/L Final         Passed - Completed PHQ-2 or PHQ-9 in the last 360 days      Passed - Valid encounter within last 6 months    Recent Outpatient Visits           1 month ago Annual physical exam   [facility] at [facility], Leita DEL, MD

## 2023-12-09 ENCOUNTER — Encounter: Payer: Self-pay | Admitting: Internal Medicine

## 2023-12-10 ENCOUNTER — Other Ambulatory Visit: Payer: Self-pay

## 2023-12-10 DIAGNOSIS — I10 Essential (primary) hypertension: Secondary | ICD-10-CM

## 2023-12-10 MED ORDER — LISINOPRIL 40 MG PO TABS: 40.0000 mg | ORAL_TABLET | Freq: Every day | ORAL | 1 refills | Status: AC

## 2023-12-12 ENCOUNTER — Encounter: Payer: Self-pay | Admitting: Internal Medicine

## 2023-12-12 ENCOUNTER — Ambulatory Visit: Admitting: Internal Medicine

## 2023-12-12 VITALS — BP 128/72 | HR 68 | Ht 64.0 in | Wt 160.4 lb

## 2023-12-12 DIAGNOSIS — R350 Frequency of micturition: Secondary | ICD-10-CM | POA: Diagnosis not present

## 2023-12-12 DIAGNOSIS — I10 Essential (primary) hypertension: Secondary | ICD-10-CM | POA: Diagnosis not present

## 2023-12-12 LAB — POCT URINALYSIS DIPSTICK
Bilirubin, UA: NEGATIVE
Blood, UA: NEGATIVE
Glucose, UA: NEGATIVE
Ketones, UA: NEGATIVE
Leukocytes, UA: NEGATIVE
Nitrite, UA: NEGATIVE
Protein, UA: NEGATIVE
Spec Grav, UA: 1.015 (ref 1.010–1.025)
Urobilinogen, UA: 0.2 U/dL
pH, UA: 5.5 (ref 5.0–8.0)

## 2023-12-12 NOTE — Patient Instructions (Signed)
 Take magnesium oxide 400 mg daily to reduce muscle cramps

## 2023-12-12 NOTE — Assessment & Plan Note (Addendum)
 Blood pressure is well controlled. HCTZ 12.5 mg added last visit. Current medications are lisinopril , metoprolol  and hydrochlorothiazide . She has noticed some calf muscle cramps at times - recommend magnesium supplement daily. Will continue same regimen along with efforts to limit dietary sodium.

## 2023-12-12 NOTE — Progress Notes (Signed)
 Date:  12/12/2023   Name:  Sarah Hernandez   DOB:  Apr 22, 1951   MRN:  [id number]   Chief Complaint: Hypertension and Urinary Tract Infection (When bladder is full, patient is having aches in pelvis. No burning, or discharge. )  Hypertension This is a chronic problem. The problem is controlled. Pertinent negatives include no chest pain, headaches, palpitations or shortness of breath. Past treatments include diuretics and beta blockers.  Urinary Tract Infection  This is a new problem. The current episode started in the past 7 days. The problem has been unchanged. Quality: pressure. The patient is experiencing no pain. There has been no fever.    Review of Systems  Constitutional:  Negative for fatigue and unexpected weight change.  HENT:  Negative for trouble swallowing.   Eyes:  Negative for visual disturbance.  Respiratory:  Negative for cough, chest tightness, shortness of breath and wheezing.   Cardiovascular:  Negative for chest pain, palpitations and leg swelling.  Gastrointestinal:  Negative for abdominal pain, constipation and diarrhea.  Musculoskeletal:  Negative for arthralgias and myalgias.  Neurological:  Negative for dizziness, weakness, light-headedness and headaches.     Lab Results  Component Value Date   NA 140 10/13/2023   K 5.2 10/13/2023   CO2 23 10/13/2023   GLUCOSE 86 10/13/2023   BUN 18 10/13/2023   CREATININE 1.15 (H) 10/13/2023   CALCIUM  9.1 10/13/2023   EGFR 50 (L) 10/13/2023   GFRNONAA 60 09/13/2019   Lab Results  Component Value Date   CHOL 178 10/13/2023   HDL 57 10/13/2023   LDLCALC 98 10/13/2023   TRIG 133 10/13/2023   CHOLHDL 3.1 10/13/2023   Lab Results  Component Value Date   TSH 5.060 (H) 10/13/2023   Lab Results  Component Value Date   HGBA1C 5.6 10/11/2022   Lab Results  Component Value Date   WBC 7.3 10/13/2023   HGB 12.6 10/13/2023   HCT 38.7 10/13/2023   MCV 94 10/13/2023   PLT 277 10/13/2023   Lab Results   Component Value Date   ALT 19 10/13/2023   AST 17 10/13/2023   GGT 11 02/25/2019   ALKPHOS 100 10/13/2023   BILITOT 0.4 10/13/2023   No results found for: MARIEN BOLLS, VD25OH   Patient Active Problem List   Diagnosis Date Noted   History of basal cell cancer 09/13/2019   Generalized anxiety disorder 09/09/2018   OAB (overactive bladder) 08/21/2016   Esophagitis, reflux 10/05/2014   Hemorrhoids, internal 10/05/2014   Hyperlipidemia, mixed 10/05/2014   Benign hypertension 10/05/2014   Irritable bowel syndrome with constipation 10/05/2014   Mild intermittent asthma without complication 10/05/2014   Allergic rhinitis, seasonal 10/05/2014   DD (diverticular disease) 12/13/2013    Allergies  Allergen Reactions   Amlodipine Swelling   Flagyl  [Metronidazole ] Nausea Only    Past Surgical History:  Procedure Laterality Date   ABDOMINAL HYSTERECTOMY  1997   APPENDECTOMY  1997   COLONOSCOPY  2015   diverticulosis   PARTIAL HYSTERECTOMY  1979   bleeding from fibroids   TONSILLECTOMY  1966   UPPER GASTROINTESTINAL ENDOSCOPY  09/2014   gastritis/esoph; no Barrett's    Social History   Tobacco Use   Smoking status: Never   Smokeless tobacco: Never  Vaping Use   Vaping status: Never Used  Substance Use Topics   Alcohol use: Yes    Alcohol/week: 3.0 - 4.0 standard drinks of alcohol    Types: 3 - 4 Glasses of  wine per week    Comment: 1 glass of wine 3-4 times per week   Drug use: Not Currently     Medication list has been reviewed and updated.  Current Meds  Medication Sig   albuterol  (PROVENTIL ) (2.5 MG/3ML) 0.083% nebulizer solution Inhale 3 mLs into the lungs 4 (four) times daily as needed. Reported on 08/16/2015   albuterol  (VENTOLIN  HFA) 108 (90 Base) MCG/ACT inhaler TAKE 2 PUFFS BY MOUTH EVERY 6 HOURS AS NEEDED FOR WHEEZE OR SHORTNESS OF BREATH   atorvastatin  (LIPITOR) 40 MG tablet TAKE 1 TABLET BY MOUTH EVERY DAY   cetirizine (ZYRTEC) 10 MG tablet  Take 10 mg by mouth daily as needed for allergies.   Cyanocobalamin (VITAMIN B-12 PO) Place 1 drop under the tongue daily.   fluticasone  (FLONASE) 50 MCG/ACT nasal spray Place 1 spray into both nostrils daily as needed for allergies or rhinitis.   hydrochlorothiazide  (MICROZIDE ) 12.5 MG capsule Take 1 capsule (12.5 mg total) by mouth daily.   ipratropium-albuterol  (DUONEB) 0.5-2.5 (3) MG/3ML SOLN Take 3 mLs by nebulization every 6 (six) hours as needed.   lisinopril  (ZESTRIL ) 40 MG tablet Take 1 tablet (40 mg total) by mouth daily.   metoprolol  succinate (TOPROL -XL) 50 MG 24 hr tablet TAKE 1 TAB IN THE MORNING AND 1/2 TAB IN THE EVENING   naproxen sodium (ALEVE) 220 MG tablet Take 220 mg by mouth 2 (two) times daily as needed.   nitroGLYCERIN  (NITROSTAT ) 0.4 MG SL tablet Place 1 tablet (0.4 mg total) under the tongue every 5 (five) minutes as needed for chest pain.   omeprazole  (PRILOSEC) 20 MG capsule TAKE 1 CAPSULE BY MOUTH EVERY DAY   sertraline  (ZOLOFT ) 50 MG tablet TAKE 1 TABLET BY MOUTH EVERY DAY   VITAMIN D PO Take 2,000 Units by mouth daily.   VITAMIN E PO Take 1 tablet by mouth daily.       12/12/2023    8:10 AM 10/13/2023    8:42 AM 04/15/2023    8:26 AM 01/13/2023    2:46 PM  GAD 7 : Generalized Anxiety Score  Nervous, Anxious, on Edge 0 1 0 0  Control/stop worrying 0 0 0 0  Worry too much - different things 0 0 0 0  Trouble relaxing 0 0 0 0  Restless 0 0 0 0  Easily annoyed or irritable 0 0 0 0  Afraid - awful might happen 0 0 0 0  Total GAD 7 Score 0 1 0 0  Anxiety Difficulty Not difficult at all Not difficult at all Not difficult at all Not difficult at all       12/12/2023    8:10 AM 10/13/2023    8:41 AM 09/24/2023    8:18 AM  Depression screen PHQ 2/9  Decreased Interest 0 0 0  Down, Depressed, Hopeless 0 0 0  PHQ - 2 Score 0 0 0  Altered sleeping 0 0 0  Tired, decreased energy 0 0 1  Change in appetite 0 0 0  Feeling bad or failure about yourself  0 0 0   Trouble concentrating 0 0 0  Moving slowly or fidgety/restless 0 0 0  Suicidal thoughts 0 0 0  PHQ-9 Score 0 0 1  Difficult doing work/chores Not difficult at all Not difficult at all Not difficult at all    BP Readings from Last 3 Encounters:  12/12/23 128/72  10/13/23 (!) 150/80  04/15/23 118/78    Physical Exam Vitals and nursing note reviewed.  Constitutional:      General: She is not in acute distress.    Appearance: She is well-developed.  HENT:     Head: Normocephalic and atraumatic.  Cardiovascular:     Rate and Rhythm: Normal rate and regular rhythm.     Heart sounds: No murmur heard. Pulmonary:     Effort: Pulmonary effort is normal. No respiratory distress.     Breath sounds: No wheezing or rhonchi.  Abdominal:     Palpations: Abdomen is soft.     Tenderness: There is no abdominal tenderness.  Musculoskeletal:     Right lower leg: No edema.     Left lower leg: No edema.  Skin:    General: Skin is warm and dry.     Findings: No rash.  Neurological:     Mental Status: She is alert and oriented to person, place, and time.  Psychiatric:        Mood and Affect: Mood normal.        Behavior: Behavior normal.     Wt Readings from Last 3 Encounters:  12/12/23 160 lb 6.4 oz (72.8 kg)  10/13/23 159 lb 3 oz (72.2 kg)  09/24/23 160 lb (72.6 kg)    BP 128/72   Pulse 68   Ht 5' 4 (1.626 m)   Wt 160 lb 6.4 oz (72.8 kg)   SpO2 99%   BMI 27.53 kg/m   Assessment and Plan:  Problem List Items Addressed This Visit       Unprioritized   Benign hypertension - Primary (Chronic)   Blood pressure is well controlled. HCTZ 12.5 mg added last visit. Current medications are lisinopril , metoprolol  and hydrochlorothiazide . She has noticed some calf muscle cramps at times - recommend magnesium supplement daily. Will continue same regimen along with efforts to limit dietary sodium.       Relevant Orders   Basic metabolic panel with GFR   POCT urinalysis dipstick  (Completed)   Other Visit Diagnoses       Urinary frequency       increase water intake planned rest room breaks - do not put off going       No follow-ups on file.    Leita HILARIO Adie, MD [facility] [place]

## 2023-12-13 ENCOUNTER — Ambulatory Visit: Payer: Self-pay | Admitting: Internal Medicine

## 2023-12-13 LAB — BASIC METABOLIC PANEL WITH GFR
BUN/Creatinine Ratio: 17 (ref 12–28)
BUN: 18 mg/dL (ref 8–27)
CO2: 22 mmol/L (ref 20–29)
Calcium: 9.1 mg/dL (ref 8.7–10.3)
Chloride: 103 mmol/L (ref 96–106)
Creatinine, Ser: 1.05 mg/dL — ABNORMAL HIGH (ref 0.57–1.00)
Glucose: 92 mg/dL (ref 70–99)
Potassium: 5 mmol/L (ref 3.5–5.2)
Sodium: 138 mmol/L (ref 134–144)
eGFR: 55 mL/min/1.73 — ABNORMAL LOW (ref >59)

## 2023-12-14 ENCOUNTER — Encounter: Payer: Self-pay | Admitting: Internal Medicine

## 2023-12-15 ENCOUNTER — Encounter: Payer: Self-pay | Admitting: Internal Medicine

## 2024-01-08 ENCOUNTER — Other Ambulatory Visit: Payer: Self-pay | Admitting: Internal Medicine

## 2024-01-08 ENCOUNTER — Encounter: Payer: Self-pay | Admitting: Internal Medicine

## 2024-01-08 DIAGNOSIS — I10 Essential (primary) hypertension: Secondary | ICD-10-CM

## 2024-01-08 DIAGNOSIS — F411 Generalized anxiety disorder: Secondary | ICD-10-CM

## 2024-01-09 ENCOUNTER — Other Ambulatory Visit: Payer: Self-pay

## 2024-01-29 ENCOUNTER — Other Ambulatory Visit: Payer: Self-pay | Admitting: Internal Medicine

## 2024-01-29 DIAGNOSIS — E782 Mixed hyperlipidemia: Secondary | ICD-10-CM

## 2024-01-29 NOTE — Telephone Encounter (Signed)
 Requested Prescriptions  Pending Prescriptions Disp Refills   atorvastatin  (LIPITOR) 40 MG tablet [Pharmacy Med Name: ATORVASTATIN  40 MG TABLET] 90 tablet 2    Sig: TAKE 1 TABLET BY MOUTH EVERY DAY     Cardiovascular:  Antilipid - Statins Failed - 01/29/2024  1:50 PM      Failed - Lipid Panel in normal range within the last 12 months    Cholesterol, Total  Date Value Ref Range Status  10/13/2023 178 100 - 199 mg/dL Final   LDL Chol Calc (NIH)  Date Value Ref Range Status  10/13/2023 98 0 - 99 mg/dL Final   HDL  Date Value Ref Range Status  10/13/2023 57 >39 mg/dL Final   Triglycerides  Date Value Ref Range Status  10/13/2023 133 0 - 149 mg/dL Final         Passed - Patient is not pregnant      Passed - Valid encounter within last 12 months    Recent Outpatient Visits           1 month ago Benign hypertension   [place] [facility] at [facility], Leita DEL, MD   3 months ago Annual physical exam   [facility] at [facility], Leita DEL, MD       Future Appointments             In 9 months Lemon Raisin, MD [facility] at [facility], [phone] [place]

## 2024-03-01 ENCOUNTER — Encounter: Admitting: Student

## 2024-03-01 DIAGNOSIS — H2513 Age-related nuclear cataract, bilateral: Secondary | ICD-10-CM | POA: Diagnosis not present

## 2024-03-01 DIAGNOSIS — H43813 Vitreous degeneration, bilateral: Secondary | ICD-10-CM | POA: Diagnosis not present

## 2024-03-01 DIAGNOSIS — M3501 Sicca syndrome with keratoconjunctivitis: Secondary | ICD-10-CM | POA: Diagnosis not present

## 2024-03-05 ENCOUNTER — Other Ambulatory Visit: Payer: Self-pay | Admitting: Internal Medicine

## 2024-03-05 DIAGNOSIS — I10 Essential (primary) hypertension: Secondary | ICD-10-CM

## 2024-03-08 ENCOUNTER — Encounter: Admitting: Student

## 2024-03-08 NOTE — Telephone Encounter (Signed)
 Requested Prescriptions  Pending Prescriptions Disp Refills   metoprolol  succinate (TOPROL -XL) 50 MG 24 hr tablet [Pharmacy Med Name: METOPROLOL  SUCC ER 50 MG TAB] 135 tablet 1    Sig: TAKE 1 TAB IN THE MORNING AND 1/2 TAB IN THE EVENING     Cardiovascular:  Beta Blockers Passed - 03/08/2024  2:17 PM      Passed - Last BP in normal range    BP Readings from Last 1 Encounters:  12/12/23 128/72         Passed - Last Heart Rate in normal range    Pulse Readings from Last 1 Encounters:  12/12/23 68         Passed - Valid encounter within last 6 months    Recent Outpatient Visits           2 months ago Benign hypertension   [place] [facility] at [place], Leita DEL, MD   4 months ago Annual physical exam   [facility] at [facility], Leita DEL, MD       Future Appointments             In 7 months Lemon Raisin, MD [facility] at [facility], [phone] [place]

## 2024-03-15 ENCOUNTER — Encounter: Admitting: Student

## 2024-03-15 DIAGNOSIS — H2512 Age-related nuclear cataract, left eye: Secondary | ICD-10-CM | POA: Diagnosis not present

## 2024-03-15 DIAGNOSIS — H2513 Age-related nuclear cataract, bilateral: Secondary | ICD-10-CM | POA: Diagnosis not present

## 2024-03-15 DIAGNOSIS — H2511 Age-related nuclear cataract, right eye: Secondary | ICD-10-CM | POA: Diagnosis not present

## 2024-03-17 ENCOUNTER — Encounter: Payer: Self-pay | Admitting: Ophthalmology

## 2024-03-17 NOTE — Anesthesia Preprocedure Evaluation (Addendum)
 Anesthesia Evaluation  Patient identified by MRN, date of birth, ID band Patient awake    Reviewed: Allergy & Precautions, H&P , NPO status , Patient's Chart, lab work & pertinent test results  Airway Mallampati: II  TM Distance: >3 FB Neck ROM: Full    Dental no notable dental hx.    Pulmonary neg pulmonary ROS, asthma , COPD   Pulmonary exam normal breath sounds clear to auscultation       Cardiovascular hypertension, negative cardio ROS Normal cardiovascular exam Rhythm:Regular Rate:Normal     Neuro/Psych  PSYCHIATRIC DISORDERS Anxiety     negative neurological ROS  negative psych ROS   GI/Hepatic negative GI ROS, Neg liver ROS,GERD  ,,  Endo/Other  negative endocrine ROS    Renal/GU negative Renal ROS  negative genitourinary   Musculoskeletal negative musculoskeletal ROS (+)    Abdominal   Peds negative pediatric ROS (+)  Hematology negative hematology ROS (+)   Anesthesia Other Findings  VERY hard of hearing HTN--didn't take one of BP meds today because it makes her urinate too much Hyperlipidemia Hypertension Overactive bladder Diverticulosis GERD (gastroesophageal reflux disease) Irritable bowel Allergy Anxiety Asthma History of basal cell cancer COPD (chronic obstructive pulmonary disease) (HCC)     Reproductive/Obstetrics negative OB ROS                              Anesthesia Physical Anesthesia Plan  ASA: 3  Anesthesia Plan: MAC   Post-op Pain Management:    Induction: Intravenous  PONV Risk Score and Plan:   Airway Management Planned: Natural Airway and Nasal Cannula  Additional Equipment:   Intra-op Plan:   Post-operative Plan:   Informed Consent: I have reviewed the patients History and Physical, chart, labs and discussed the procedure including the risks, benefits and alternatives for the proposed anesthesia with the patient or authorized  representative who has indicated his/her understanding and acceptance.     Dental Advisory Given  Plan Discussed with: Anesthesiologist, CRNA and Surgeon  Anesthesia Plan Comments: (Patient consented for risks of anesthesia including but not limited to:  - adverse reactions to medications - damage to eyes, teeth, lips or other oral mucosa - nerve damage due to positioning  - sore throat or hoarseness - Damage to heart, brain, nerves, lungs, other parts of body or loss of life  Patient voiced understanding and assent.)         Anesthesia Quick Evaluation

## 2024-03-18 NOTE — Discharge Instructions (Signed)

## 2024-03-23 ENCOUNTER — Other Ambulatory Visit: Payer: Self-pay

## 2024-03-23 ENCOUNTER — Ambulatory Visit: Payer: Self-pay | Admitting: Anesthesiology

## 2024-03-23 ENCOUNTER — Encounter: Payer: Self-pay | Admitting: Ophthalmology

## 2024-03-23 ENCOUNTER — Ambulatory Visit
Admission: RE | Admit: 2024-03-23 | Discharge: 2024-03-23 | Disposition: A | Discharge status: Home / Self Care | Attending: Ophthalmology | Admitting: Ophthalmology

## 2024-03-23 ENCOUNTER — Encounter
Admission: RE | Disposition: A | Discharge status: Home / Self Care | Payer: Self-pay | Source: Home / Self Care | Attending: Ophthalmology

## 2024-03-23 DIAGNOSIS — I10 Essential (primary) hypertension: Secondary | ICD-10-CM | POA: Diagnosis not present

## 2024-03-23 DIAGNOSIS — J4489 Other specified chronic obstructive pulmonary disease: Secondary | ICD-10-CM | POA: Diagnosis not present

## 2024-03-23 DIAGNOSIS — H2512 Age-related nuclear cataract, left eye: Secondary | ICD-10-CM | POA: Diagnosis not present

## 2024-03-23 DIAGNOSIS — F411 Generalized anxiety disorder: Secondary | ICD-10-CM | POA: Diagnosis not present

## 2024-03-23 DIAGNOSIS — K219 Gastro-esophageal reflux disease without esophagitis: Secondary | ICD-10-CM | POA: Insufficient documentation

## 2024-03-23 DIAGNOSIS — E785 Hyperlipidemia, unspecified: Secondary | ICD-10-CM | POA: Diagnosis not present

## 2024-03-23 DIAGNOSIS — J449 Chronic obstructive pulmonary disease, unspecified: Secondary | ICD-10-CM | POA: Diagnosis not present

## 2024-03-23 HISTORY — DX: Chronic obstructive pulmonary disease, unspecified: J44.9

## 2024-03-23 HISTORY — PX: CATARACT EXTRACTION W/PHACO: SHX586

## 2024-03-23 HISTORY — DX: Unspecified hearing loss, unspecified ear: H91.90

## 2024-03-23 SURGERY — PHACOEMULSIFICATION, CATARACT, WITH IOL INSERTION
Anesthesia: Monitor Anesthesia Care | Site: Eye | Laterality: Left

## 2024-03-23 MED ORDER — PHENYLEPHRINE HCL 10 % OP SOLN
1.0000 [drp] | OPHTHALMIC | Status: DC | PRN
  Administered 2024-03-23 (×3): 1 [drp] via OPHTHALMIC

## 2024-03-23 MED ORDER — LABETALOL HCL 5 MG/ML IV SOLN
INTRAVENOUS | Status: DC | PRN
  Administered 2024-03-23: 5 mg via INTRAVENOUS

## 2024-03-23 MED ORDER — LIDOCAINE HCL (PF) 2 % IJ SOLN
INTRAOCULAR | Status: DC | PRN
  Administered 2024-03-23: 2 mL

## 2024-03-23 MED ORDER — MOXIFLOXACIN HCL 0.5 % OP SOLN
OPHTHALMIC | Status: DC | PRN
  Administered 2024-03-23: .2 mL via OPHTHALMIC

## 2024-03-23 MED ORDER — TETRACAINE HCL 0.5 % OP SOLN
1.0000 [drp] | OPHTHALMIC | Status: DC | PRN
  Administered 2024-03-23 (×3): 1 [drp] via OPHTHALMIC

## 2024-03-23 MED ORDER — FENTANYL CITRATE (PF) 100 MCG/2ML IJ SOLN
INTRAMUSCULAR | Status: AC
  Filled 2024-03-23: qty 2

## 2024-03-23 MED ORDER — LABETALOL HCL 5 MG/ML IV SOLN
INTRAVENOUS | Status: AC
Start: 2024-03-23 — End: 2024-03-23
  Filled 2024-03-23: qty 4

## 2024-03-23 MED ORDER — CYCLOPENTOLATE HCL 2 % OP SOLN
1.0000 [drp] | OPHTHALMIC | Status: DC | PRN
  Administered 2024-03-23 (×3): 1 [drp] via OPHTHALMIC

## 2024-03-23 MED ORDER — MIDAZOLAM HCL (PF) 2 MG/2ML IJ SOLN
INTRAMUSCULAR | Status: DC | PRN
  Administered 2024-03-23: 2 mg via INTRAVENOUS

## 2024-03-23 MED ORDER — SIGHTPATH DOSE#1 NA CHONDROIT SULF-NA HYALURON 40-17 MG/ML IO SOLN
INTRAOCULAR | Status: DC | PRN
  Administered 2024-03-23: 1 mL via INTRAOCULAR

## 2024-03-23 MED ORDER — SIGHTPATH DOSE#1 BSS IO SOLN
INTRAOCULAR | Status: DC | PRN
  Administered 2024-03-23: 15 mL via INTRAOCULAR

## 2024-03-23 MED ORDER — LACTATED RINGERS IV SOLN: INTRAVENOUS | Status: DC

## 2024-03-23 MED ORDER — MIDAZOLAM HCL 2 MG/2ML IJ SOLN
INTRAMUSCULAR | Status: AC
  Filled 2024-03-23: qty 2

## 2024-03-23 MED ORDER — FENTANYL CITRATE (PF) 100 MCG/2ML IJ SOLN
INTRAMUSCULAR | Status: DC | PRN
  Administered 2024-03-23 (×2): 50 ug via INTRAVENOUS

## 2024-03-23 MED ORDER — BRIMONIDINE TARTRATE-TIMOLOL 0.2-0.5 % OP SOLN
OPHTHALMIC | Status: DC | PRN
  Administered 2024-03-23: 1 [drp] via OPHTHALMIC

## 2024-03-23 MED ORDER — CYCLOPENTOLATE HCL 2 % OP SOLN
OPHTHALMIC | Status: AC
  Filled 2024-03-23: qty 2

## 2024-03-23 MED ORDER — TETRACAINE HCL 0.5 % OP SOLN
OPHTHALMIC | Status: AC
  Filled 2024-03-23: qty 4

## 2024-03-23 MED ORDER — TETRACAINE HCL 0.5 % OP SOLN: 1.0000 [drp] | OPHTHALMIC | Status: DC | PRN

## 2024-03-23 MED ORDER — SIGHTPATH DOSE#1 BSS IO SOLN
INTRAOCULAR | Status: DC | PRN
  Administered 2024-03-23: 47 mL via OPHTHALMIC

## 2024-03-23 MED ORDER — PHENYLEPHRINE HCL 10 % OP SOLN
OPHTHALMIC | Status: AC
  Filled 2024-03-23: qty 5

## 2024-03-23 SURGICAL SUPPLY — 10 items
CANNULA ANT/CHMB 27G (MISCELLANEOUS) ×1 IMPLANT
CYSTOTOME ANGL RVRS SHRT 25G (CUTTER) ×1 IMPLANT
FEE CATARACT SUITE SIGHTPATH (MISCELLANEOUS) ×1 IMPLANT
GLOVE BIOGEL PI IND STRL 8 (GLOVE) ×1 IMPLANT
GLOVE SURG LX STRL 8.0 MICRO (GLOVE) ×1 IMPLANT
GLOVE SURG SYN 6.5 PF PI BL (GLOVE) ×1 IMPLANT
LENS IOL TECNIS EYHANCE 16.5 (Intraocular Lens) IMPLANT
NDL FILTER BLUNT 18X1 1/2 (NEEDLE) ×1 IMPLANT
NEEDLE FILTER BLUNT 18X1 1/2 (NEEDLE) ×1 IMPLANT
SYR 3ML LL SCALE MARK (SYRINGE) ×1 IMPLANT

## 2024-03-23 NOTE — Transfer of Care (Signed)
 Immediate Anesthesia Transfer of Care Note  Patient: Sarah Hernandez  Procedure(s) Performed: PHACOEMULSIFICATION, CATARACT, WITH IOL INSERTION 10.33 00:52.1 (Left: Eye)  Patient Location: PACU  Anesthesia Type: MAC  Level of Consciousness: awake, alert  and patient cooperative  Airway and Oxygen Therapy: Patient Spontanous Breathing   Post-op Assessment: Post-op Vital signs reviewed, Patient's Cardiovascular Status Stable, Respiratory Function Stable, Patent Airway and No signs of Nausea or vomiting  Post-op Vital Signs: Reviewed and stable  Complications: No notable events documented.

## 2024-03-23 NOTE — Anesthesia Postprocedure Evaluation (Signed)
 Anesthesia Post Note  Patient: FREDONIA CASALINO  Procedure(s) Performed: PHACOEMULSIFICATION, CATARACT, WITH IOL INSERTION 10.33 00:52.1 (Left: Eye)  Patient location during evaluation: PACU Anesthesia Type: MAC Level of consciousness: awake and alert Pain management: pain level controlled Vital Signs Assessment: post-procedure vital signs reviewed and stable Respiratory status: spontaneous breathing, nonlabored ventilation, respiratory function stable and patient connected to nasal cannula oxygen Cardiovascular status: stable and blood pressure returned to baseline Postop Assessment: no apparent nausea or vomiting Anesthetic complications: no   No notable events documented.   Last Vitals:  Vitals:   03/23/24 0838 03/23/24 0844  BP: (!) 163/77 (!) 149/78  Pulse: 65 68  Resp: 16 11  Temp: (!) 36.1 C (!) 36.1 C  SpO2: 97% 96%    Last Pain:  Vitals:   03/23/24 0844  TempSrc:   PainSc: 0-No pain                 Brandon Scarbrough C Izeyah Deike

## 2024-03-23 NOTE — Op Note (Signed)
 PREOPERATIVE DIAGNOSIS:  Nuclear sclerotic cataract of the left eye.   POSTOPERATIVE DIAGNOSIS:  Nuclear sclerotic cataract of the left eye.   OPERATIVE PROCEDURE:ORPROCALL@   SURGEON:  Sarah Carmine, MD.   ANESTHESIA:  Anesthesiologist: Ola Donny BROCKS, MD CRNA: Veronica Alm BROCKS, CRNA  1.      Managed anesthesia care. 2.     0.16ml of Shugarcaine was instilled following the paracentesis   COMPLICATIONS:  None.   TECHNIQUE:   Stop and chop   DESCRIPTION OF PROCEDURE:  The patient was examined and consented in the preoperative holding area where the aforementioned topical anesthesia was applied to the left eye and then brought back to the Operating Room where the left eye was prepped and draped in the usual sterile ophthalmic fashion and a lid speculum was placed. A paracentesis was created with the side port blade and the anterior chamber was filled with viscoelastic. A near clear corneal incision was performed with the steel keratome. A continuous curvilinear capsulorrhexis was performed with a cystotome followed by the capsulorrhexis forceps. Hydrodissection and hydrodelineation were carried out with BSS on a blunt cannula. The lens was removed in a stop and chop  technique and the remaining cortical material was removed with the irrigation-aspiration handpiece. The capsular bag was inflated with viscoelastic and the intraocular lens was placed in the capsular bag without complication. The remaining viscoelastic was removed from the eye with the irrigation-aspiration handpiece. The wounds were hydrated. The anterior chamber was flushed with BSS and the eye was inflated to physiologic pressure. 0.60ml Vigamox was placed in the anterior chamber. The wounds were found to be water tight. The eye was dressed with Combigan. The patient was given protective glasses to wear throughout the day and a shield with which to sleep tonight. The patient was also given drops with which to begin a drop regimen  today and will follow-up with me in one day. Implant Name Type Inv. Item Serial No. Manufacturer Lot No. LRB No. Used Action  LENS IOL TECNIS EYHANCE 16.5 - [id number] Intraocular Lens LENS IOL TECNIS EYHANCE 16.5 [id number] [employer]  Left 1 Implanted    Procedure(s): PHACOEMULSIFICATION, CATARACT, WITH IOL INSERTION 10.33 00:52.1 (Left)  Electronically signed: Elsie Hernandez 03/23/2024 8:37 AM

## 2024-03-23 NOTE — H&P (Signed)
 [facility]   Primary Care Physician:  Justus Leita DEL, MD Ophthalmologist: Dr. Elsie Carmine  Pre-Procedure History & Physical: HPI:  Sarah Hernandez is a 73 y.o. female here for cataract surgery.   Past Medical History:  Diagnosis Date   Allergy Seasonal   Anxiety Meds   Asthma Meds as needed   COPD (chronic obstructive pulmonary disease) (HCC)    Diverticulosis    GERD (gastroesophageal reflux disease)    Hard of hearing    History of basal cell cancer 09/13/2019   Hyperlipidemia    Hypertension    Irritable bowel    Overactive bladder     Past Surgical History:  Procedure Laterality Date   ABDOMINAL HYSTERECTOMY  1997   APPENDECTOMY  1997   COLONOSCOPY  2015   diverticulosis   PARTIAL HYSTERECTOMY  1979   bleeding from fibroids   TONSILLECTOMY  1966   UPPER GASTROINTESTINAL ENDOSCOPY  09/2014   gastritis/esoph; no Barrett's    Prior to Admission medications   Medication Sig Start Date End Date Taking? Authorizing Provider  atorvastatin  (LIPITOR) 40 MG tablet TAKE 1 TABLET BY MOUTH EVERY DAY 01/29/24  Yes Berglund, Laura H, MD  cetirizine (ZYRTEC) 10 MG tablet Take 10 mg by mouth daily as needed for allergies.   Yes [provider]  Cyanocobalamin (VITAMIN B-12 PO) Place 1 drop under the tongue daily.   Yes [provider]  fluticasone  (FLONASE) 50 MCG/ACT nasal spray Place 1 spray into both nostrils daily as needed for allergies or rhinitis.   Yes [provider]  hydrochlorothiazide  (MICROZIDE ) 12.5 MG capsule TAKE 1 CAPSULE BY MOUTH EVERY DAY 01/09/24  Yes Justus Leita DEL, MD  lisinopril  (ZESTRIL ) 40 MG tablet Take 1 tablet (40 mg total) by mouth daily. 12/10/23  Yes Justus Leita DEL, MD  metoprolol  succinate (TOPROL -XL) 50 MG 24 hr tablet TAKE 1 TAB IN THE MORNING AND 1/2 TAB IN THE EVENING 03/08/24  Yes Justus Leita DEL, MD  omeprazole  (PRILOSEC) 20 MG capsule TAKE 1 CAPSULE BY MOUTH EVERY DAY 12/05/23  Yes Justus Leita DEL,  MD  sertraline  (ZOLOFT ) 50 MG tablet TAKE 1 TABLET BY MOUTH EVERY DAY 01/09/24  Yes Justus Leita DEL, MD  albuterol  (PROVENTIL ) (2.5 MG/3ML) 0.083% nebulizer solution Inhale 3 mLs into the lungs 4 (four) times daily as needed. Reported on 08/16/2015 06/07/20   Justus Leita DEL, MD  albuterol  (VENTOLIN  HFA) 108 [phone] Base) MCG/ACT inhaler TAKE 2 PUFFS BY MOUTH EVERY 6 HOURS AS NEEDED FOR WHEEZE OR SHORTNESS OF BREATH 08/19/21   Justus Leita DEL, MD  ipratropium-albuterol  (DUONEB) 0.5-2.5 (3) MG/3ML SOLN Take 3 mLs by nebulization every 6 (six) hours as needed. 01/31/22   Joshua Cathryne BROCKS, MD  naproxen sodium (ALEVE) 220 MG tablet Take 220 mg by mouth 2 (two) times daily as needed.    [provider]  nitroGLYCERIN  (NITROSTAT ) 0.4 MG SL tablet Place 1 tablet (0.4 mg total) under the tongue every 5 (five) minutes as needed for chest pain. 05/05/18 12/12/23  Edelmiro Leash, MD  VITAMIN D PO Take 2,000 Units by mouth daily. Patient not taking: Reported on 03/17/2024    [provider]  VITAMIN E PO Take 1 tablet by mouth daily. Patient not taking: Reported on 03/17/2024    [provider]    Allergies as of 03/03/2024 - Review Complete 12/12/2023  Allergen Reaction Noted   Amlodipine Swelling 11/24/2019   Flagyl  [metronidazole ] Nausea Only 01/09/2021    Family History  Problem Relation Age of Onset   Dementia Mother    Varicose Veins Mother    CAD Father    Heart disease Father    CAD Brother    Stroke Brother    Heart disease Son     Social History   Socioeconomic History   Marital status: Married    Spouse name: Sarah Hernandez   Number of children: 2   Years of education: 12+   Highest education level: Some college, no degree  Occupational History   Occupation: full time    Comment: Optician, Dispensing  Tobacco Use   Smoking status: Never   Smokeless tobacco: Never  Vaping Use   Vaping status: Never Used  Substance and Sexual Activity   Alcohol  use: Yes    Alcohol/week: 3.0 - 4.0 standard drinks of alcohol    Types: 3 - 4 Glasses of wine per week    Comment: 1 glass of wine 3-4 times per week   Drug use: Not Currently   Sexual activity: Yes    Partners: Male  Other Topics Concern   Not on file  Social History Narrative   09/24/23 working full time at [employer]   Social Drivers of Health   Financial Resource Strain: Low Risk  (12/05/2023)   Overall Financial Resource Strain (CARDIA)    Difficulty of Paying Living Expenses: Not hard at all  Food Insecurity: No Food Insecurity (12/05/2023)   Hunger Vital Sign    Worried About Running Out of Food in the Last Year: Never true    Ran Out of Food in the Last Year: Never true  Transportation Needs: No Transportation Needs (12/05/2023)   PRAPARE - Administrator, Civil Service (Medical): No    Lack of Transportation (Non-Medical): No  Physical Activity: Sufficiently Active (12/05/2023)   Exercise Vital Sign    Days of Exercise per Week: 5 days    Minutes of Exercise per Session: 30 min  Recent Concern: Physical Activity - Inactive (09/24/2023)   Exercise Vital Sign    Days of Exercise per Week: 0 days    Minutes of Exercise per Session: 0 min  Stress: No Stress Concern Present (12/05/2023)   [employer] of Occupational Health - Occupational Stress Questionnaire    Feeling of Stress: Not at all  Social Connections: Moderately Isolated (12/05/2023)   Social Connection and Isolation Panel    Frequency of Communication with Friends and Family: Three times a week    Frequency of Social Gatherings with Friends and Family: Once a week    Attends Religious Services: Never    Database Administrator or Organizations: No    Attends Engineer, Structural: Not on file    Marital Status: Married  Catering Manager Violence: Not At Risk (09/24/2023)   Humiliation, Afraid, Rape, and Kick questionnaire    Fear of Current or Ex-Partner: No    Emotionally  Abused: No    Physically Abused: No    Sexually Abused: No    Review of Systems: See HPI, otherwise negative ROS  Physical Exam: BP (!) 216/87   Pulse 69   Temp 98.2 F (36.8 C) (Temporal)   Resp 20   Ht 5' 4 (1.626 m)   Wt 74.7 kg   SpO2 98%   BMI 28.25 kg/m  General:   Alert, cooperative. Head:  Normocephalic and atraumatic. Respiratory:  Normal work of breathing. Cardiovascular:  NAD  Impression/Plan: Sarah Hernandez is here  for cataract surgery.  Risks, benefits, limitations, and alternatives regarding cataract surgery have been reviewed with the patient.  Questions have been answered.  All parties agreeable.   Elsie Carmine, MD  03/23/2024, 8:17 AM

## 2024-03-29 DIAGNOSIS — H2511 Age-related nuclear cataract, right eye: Secondary | ICD-10-CM | POA: Diagnosis not present

## 2024-04-02 NOTE — Discharge Instructions (Signed)

## 2024-04-05 NOTE — Anesthesia Preprocedure Evaluation (Signed)
 Anesthesia Evaluation  Patient identified by MRN, date of birth, ID band Patient awake    Reviewed: Allergy & Precautions, H&P , NPO status , Patient's Chart, lab work & pertinent test results  Airway Mallampati: II  TM Distance: >3 FB Neck ROM: Full    Dental no notable dental hx.    Pulmonary neg pulmonary ROS, asthma , COPD   Pulmonary exam normal breath sounds clear to auscultation       Cardiovascular hypertension, negative cardio ROS Normal cardiovascular exam Rhythm:Regular Rate:Normal     Neuro/Psych  PSYCHIATRIC DISORDERS Anxiety     negative neurological ROS  negative psych ROS   GI/Hepatic negative GI ROS, Neg liver ROS,GERD  ,,  Endo/Other  negative endocrine ROS    Renal/GU negative Renal ROS  negative genitourinary   Musculoskeletal negative musculoskeletal ROS (+)    Abdominal   Peds negative pediatric ROS (+)  Hematology negative hematology ROS (+)   Anesthesia Other Findings Previous cataract surgery 03-23-24 Dr. Ola  VERY hard of hearing  HTN--didn't take one of BP meds because it makes her urinate too much  Hyperlipidemia Hypertension Overactive bladder Diverticulosis GERD (gastroesophageal reflux disease) Irritable bowel Allergy Anxiety Asthma History of basal cell cancer COPD (chronic obstructive pulmonary disease) (HCC)                 Reproductive/Obstetrics negative OB ROS                              Anesthesia Physical Anesthesia Plan  ASA: 3  Anesthesia Plan: MAC   Post-op Pain Management:    Induction: Intravenous  PONV Risk Score and Plan:   Airway Management Planned: Natural Airway and Nasal Cannula  Additional Equipment:   Intra-op Plan:   Post-operative Plan:   Informed Consent: I have reviewed the patients History and Physical, chart, labs and discussed the procedure including the risks, benefits and alternatives for the  proposed anesthesia with the patient or authorized representative who has indicated his/her understanding and acceptance.     Dental Advisory Given  Plan Discussed with: Anesthesiologist, CRNA and Surgeon  Anesthesia Plan Comments: (Patient consented for risks of anesthesia including but not limited to:  - adverse reactions to medications - damage to eyes, teeth, lips or other oral mucosa - nerve damage due to positioning  - sore throat or hoarseness - Damage to heart, brain, nerves, lungs, other parts of body or loss of life  Patient voiced understanding and assent.)         Anesthesia Quick Evaluation

## 2024-04-06 ENCOUNTER — Ambulatory Visit: Payer: Self-pay | Admitting: General Practice

## 2024-04-06 ENCOUNTER — Ambulatory Visit
Admission: RE | Admit: 2024-04-06 | Discharge: 2024-04-06 | Disposition: A | Discharge status: Home / Self Care | Attending: Ophthalmology | Admitting: Ophthalmology

## 2024-04-06 ENCOUNTER — Encounter
Admission: RE | Disposition: A | Discharge status: Home / Self Care | Payer: Self-pay | Source: Home / Self Care | Attending: Ophthalmology

## 2024-04-06 ENCOUNTER — Encounter: Payer: Self-pay | Admitting: Ophthalmology

## 2024-04-06 ENCOUNTER — Other Ambulatory Visit: Payer: Self-pay

## 2024-04-06 ENCOUNTER — Other Ambulatory Visit: Payer: Self-pay | Admitting: Internal Medicine

## 2024-04-06 DIAGNOSIS — F419 Anxiety disorder, unspecified: Secondary | ICD-10-CM | POA: Diagnosis not present

## 2024-04-06 DIAGNOSIS — K219 Gastro-esophageal reflux disease without esophagitis: Secondary | ICD-10-CM | POA: Diagnosis not present

## 2024-04-06 DIAGNOSIS — J4489 Other specified chronic obstructive pulmonary disease: Secondary | ICD-10-CM | POA: Diagnosis not present

## 2024-04-06 DIAGNOSIS — H2511 Age-related nuclear cataract, right eye: Secondary | ICD-10-CM | POA: Diagnosis not present

## 2024-04-06 DIAGNOSIS — I10 Essential (primary) hypertension: Secondary | ICD-10-CM | POA: Insufficient documentation

## 2024-04-06 DIAGNOSIS — E782 Mixed hyperlipidemia: Secondary | ICD-10-CM | POA: Diagnosis not present

## 2024-04-06 HISTORY — PX: CATARACT EXTRACTION W/PHACO: SHX586

## 2024-04-06 SURGERY — PHACOEMULSIFICATION, CATARACT, WITH IOL INSERTION
Anesthesia: Monitor Anesthesia Care | Site: Eye | Laterality: Right

## 2024-04-06 MED ORDER — MIDAZOLAM HCL (PF) 2 MG/2ML IJ SOLN
INTRAMUSCULAR | Status: DC | PRN
  Administered 2024-04-06: 2 mg via INTRAVENOUS

## 2024-04-06 MED ORDER — BRIMONIDINE TARTRATE-TIMOLOL 0.2-0.5 % OP SOLN
OPHTHALMIC | Status: DC | PRN
  Administered 2024-04-06: 1 [drp] via OPHTHALMIC

## 2024-04-06 MED ORDER — SIGHTPATH DOSE#1 NA CHONDROIT SULF-NA HYALURON 40-17 MG/ML IO SOLN
INTRAOCULAR | Status: DC | PRN
  Administered 2024-04-06: 1 mL via INTRAOCULAR

## 2024-04-06 MED ORDER — CYCLOPENTOLATE HCL 2 % OP SOLN
1.0000 [drp] | OPHTHALMIC | Status: AC
  Administered 2024-04-06 (×3): 1 [drp] via OPHTHALMIC

## 2024-04-06 MED ORDER — SIGHTPATH DOSE#1 BSS IO SOLN
INTRAOCULAR | Status: DC | PRN
  Administered 2024-04-06: 39 mL via OPHTHALMIC

## 2024-04-06 MED ORDER — LACTATED RINGERS IV SOLN: INTRAVENOUS | Status: DC

## 2024-04-06 MED ORDER — METOPROLOL TARTRATE 5 MG/5ML IV SOLN
INTRAVENOUS | Status: DC | PRN
  Administered 2024-04-06 (×2): 1 mg via INTRAVENOUS

## 2024-04-06 MED ORDER — TETRACAINE HCL 0.5 % OP SOLN
1.0000 [drp] | OPHTHALMIC | Status: DC | PRN
  Administered 2024-04-06 (×3): 1 [drp] via OPHTHALMIC

## 2024-04-06 MED ORDER — LIDOCAINE HCL (PF) 2 % IJ SOLN
INTRAOCULAR | Status: DC | PRN
Start: 2024-04-06 — End: 2024-04-06
  Administered 2024-04-06: 2 mL

## 2024-04-06 MED ORDER — MIDAZOLAM HCL 2 MG/2ML IJ SOLN
INTRAMUSCULAR | Status: AC
  Filled 2024-04-06: qty 2

## 2024-04-06 MED ORDER — TETRACAINE HCL 0.5 % OP SOLN
OPHTHALMIC | Status: AC
  Filled 2024-04-06: qty 4

## 2024-04-06 MED ORDER — MOXIFLOXACIN HCL 0.5 % OP SOLN
OPHTHALMIC | Status: DC | PRN
  Administered 2024-04-06: .2 mL via OPHTHALMIC

## 2024-04-06 MED ORDER — PHENYLEPHRINE HCL 10 % OP SOLN
1.0000 [drp] | OPHTHALMIC | Status: AC
  Administered 2024-04-06 (×3): 1 [drp] via OPHTHALMIC

## 2024-04-06 MED ORDER — FENTANYL CITRATE (PF) 100 MCG/2ML IJ SOLN
INTRAMUSCULAR | Status: DC | PRN
  Administered 2024-04-06 (×2): 50 ug via INTRAVENOUS

## 2024-04-06 MED ORDER — CYCLOPENTOLATE HCL 2 % OP SOLN
OPHTHALMIC | Status: AC
  Filled 2024-04-06: qty 2

## 2024-04-06 MED ORDER — METOPROLOL TARTRATE 5 MG/5ML IV SOLN
INTRAVENOUS | Status: AC
  Filled 2024-04-06: qty 5

## 2024-04-06 MED ORDER — PHENYLEPHRINE HCL 10 % OP SOLN
OPHTHALMIC | Status: AC
  Filled 2024-04-06: qty 5

## 2024-04-06 MED ORDER — SIGHTPATH DOSE#1 BSS IO SOLN
INTRAOCULAR | Status: DC | PRN
  Administered 2024-04-06: 15 mL via INTRAOCULAR

## 2024-04-06 MED ORDER — FENTANYL CITRATE (PF) 100 MCG/2ML IJ SOLN
INTRAMUSCULAR | Status: AC
  Filled 2024-04-06: qty 2

## 2024-04-06 SURGICAL SUPPLY — 10 items
CANNULA ANT/CHMB 27G (MISCELLANEOUS) ×1 IMPLANT
CYSTOTOME ANGL RVRS SHRT 25G (CUTTER) ×1 IMPLANT
FEE CATARACT SUITE SIGHTPATH (MISCELLANEOUS) ×1 IMPLANT
GLOVE BIOGEL PI IND STRL 8 (GLOVE) ×1 IMPLANT
GLOVE SURG LX STRL 8.0 MICRO (GLOVE) ×1 IMPLANT
GLOVE SURG SYN 6.5 PF PI BL (GLOVE) ×1 IMPLANT
LENS IOL TECNIS EYHANCE 16.5 (Intraocular Lens) IMPLANT
NDL FILTER BLUNT 18X1 1/2 (NEEDLE) ×1 IMPLANT
NEEDLE FILTER BLUNT 18X1 1/2 (NEEDLE) ×1 IMPLANT
SYR 3ML LL SCALE MARK (SYRINGE) ×1 IMPLANT

## 2024-04-06 NOTE — Transfer of Care (Signed)
 Immediate Anesthesia Transfer of Care Note  Patient: Sarah Hernandez  Procedure(s) Performed: PHACOEMULSIFICATION, CATARACT, WITH IOL INSERTION 8.78 00:46.5 (Right: Eye)  Patient Location: PACU  Anesthesia Type: MAC  Level of Consciousness: awake, alert  and patient cooperative  Airway and Oxygen Therapy: Patient Spontanous Breathing and Patient connected to supplemental oxygen  Post-op Assessment: Post-op Vital signs reviewed, Patient's Cardiovascular Status Stable, Respiratory Function Stable, Patent Airway and No signs of Nausea or vomiting  Post-op Vital Signs: Reviewed and stable  Complications: No notable events documented.

## 2024-04-06 NOTE — H&P (Signed)
 [facility]   Primary Care Physician:  Justus Leita DEL, MD Ophthalmologist: Dr. Elsie Carmine  Pre-Procedure History & Physical: HPI:  Sarah Hernandez is a 73 y.o. female here for cataract surgery.   Past Medical History:  Diagnosis Date   Allergy Seasonal   Anxiety Meds   Asthma Meds as needed   COPD (chronic obstructive pulmonary disease) (HCC)    Diverticulosis    GERD (gastroesophageal reflux disease)    Hard of hearing    History of basal cell cancer 09/13/2019   Hyperlipidemia    Hypertension    Irritable bowel    Overactive bladder     Past Surgical History:  Procedure Laterality Date   ABDOMINAL HYSTERECTOMY  1997   APPENDECTOMY  1997   CATARACT EXTRACTION W/PHACO Left 03/23/2024   Procedure: PHACOEMULSIFICATION, CATARACT, WITH IOL INSERTION 10.33 00:52.1;  Surgeon: Carmine Elsie, MD;  Location: [facility];  Service: Ophthalmology;  Laterality: Left;   COLONOSCOPY  2015   diverticulosis   PARTIAL HYSTERECTOMY  1979   bleeding from fibroids   TONSILLECTOMY  1966   UPPER GASTROINTESTINAL ENDOSCOPY  09/2014   gastritis/esoph; no Barrett's    Prior to Admission medications   Medication Sig Start Date End Date Taking? Authorizing Provider  albuterol  (PROVENTIL ) (2.5 MG/3ML) 0.083% nebulizer solution Inhale 3 mLs into the lungs 4 (four) times daily as needed. Reported on 08/16/2015 06/07/20  Yes Justus Leita DEL, MD  albuterol  (VENTOLIN  HFA) 108 [phone] Base) MCG/ACT inhaler TAKE 2 PUFFS BY MOUTH EVERY 6 HOURS AS NEEDED FOR WHEEZE OR SHORTNESS OF BREATH 08/19/21  Yes Justus Leita DEL, MD  atorvastatin  (LIPITOR) 40 MG tablet TAKE 1 TABLET BY MOUTH EVERY DAY 01/29/24  Yes Berglund, Laura H, MD  cetirizine (ZYRTEC) 10 MG tablet Take 10 mg by mouth daily as needed for allergies.   Yes [provider]  Cyanocobalamin (VITAMIN B-12 PO) Place 1 drop under the tongue daily.   Yes [provider]  fluticasone  (FLONASE) 50 MCG/ACT nasal spray  Place 1 spray into both nostrils daily as needed for allergies or rhinitis.   Yes [provider]  hydrochlorothiazide  (MICROZIDE ) 12.5 MG capsule TAKE 1 CAPSULE BY MOUTH EVERY DAY 01/09/24  Yes Justus Leita DEL, MD  ipratropium-albuterol  (DUONEB) 0.5-2.5 (3) MG/3ML SOLN Take 3 mLs by nebulization every 6 (six) hours as needed. 01/31/22  Yes Joshua Cathryne BROCKS, MD  lisinopril  (ZESTRIL ) 40 MG tablet Take 1 tablet (40 mg total) by mouth daily. 12/10/23  Yes Justus Leita DEL, MD  metoprolol  succinate (TOPROL -XL) 50 MG 24 hr tablet TAKE 1 TAB IN THE MORNING AND 1/2 TAB IN THE EVENING 03/08/24  Yes Justus Leita DEL, MD  naproxen sodium (ALEVE) 220 MG tablet Take 220 mg by mouth 2 (two) times daily as needed.   Yes [provider]  omeprazole  (PRILOSEC) 20 MG capsule TAKE 1 CAPSULE BY MOUTH EVERY DAY 12/05/23  Yes Berglund, Laura H, MD  sertraline  (ZOLOFT ) 50 MG tablet TAKE 1 TABLET BY MOUTH EVERY DAY 01/09/24  Yes Justus Leita DEL, MD  nitroGLYCERIN  (NITROSTAT ) 0.4 MG SL tablet Place 1 tablet (0.4 mg total) under the tongue every 5 (five) minutes as needed for chest pain. 05/05/18 12/12/23  Edelmiro Leash, MD  VITAMIN D PO Take 2,000 Units by mouth daily. Patient not taking: Reported on 03/17/2024    [provider]  VITAMIN E PO Take 1 tablet by mouth daily. Patient not taking: Reported on 03/17/2024    [provider]    Allergies as of 03/03/2024 - Review Complete 12/12/2023  Allergen Reaction Noted   Amlodipine Swelling 11/24/2019   Flagyl  [metronidazole ] Nausea Only 01/09/2021    Family History  Problem Relation Age of Onset   Dementia Mother    Varicose Veins Mother    CAD Father    Heart disease Father    CAD Brother    Stroke Brother    Heart disease Son     Social History   Socioeconomic History   Marital status: Married    Spouse name: Lorrene Donald   Number of children: 2   Years of education: 12+   Highest education level: Some  college, no degree  Occupational History   Occupation: full time    Comment: Optician, Dispensing  Tobacco Use   Smoking status: Never   Smokeless tobacco: Never  Vaping Use   Vaping status: Never Used  Substance and Sexual Activity   Alcohol use: Yes    Alcohol/week: 3.0 - 4.0 standard drinks of alcohol    Types: 3 - 4 Glasses of wine per week    Comment: 1 glass of wine 3-4 times per week   Drug use: Not Currently   Sexual activity: Yes    Partners: Male  Other Topics Concern   Not on file  Social History Narrative   09/24/23 working full time at [employer]   Social Drivers of Health   Financial Resource Strain: Low Risk  (12/05/2023)   Overall Financial Resource Strain (CARDIA)    Difficulty of Paying Living Expenses: Not hard at all  Food Insecurity: No Food Insecurity (12/05/2023)   Hunger Vital Sign    Worried About Running Out of Food in the Last Year: Never true    Ran Out of Food in the Last Year: Never true  Transportation Needs: No Transportation Needs (12/05/2023)   PRAPARE - Administrator, Civil Service (Medical): No    Lack of Transportation (Non-Medical): No  Physical Activity: Sufficiently Active (12/05/2023)   Exercise Vital Sign    Days of Exercise per Week: 5 days    Minutes of Exercise per Session: 30 min  Recent Concern: Physical Activity - Inactive (09/24/2023)   Exercise Vital Sign    Days of Exercise per Week: 0 days    Minutes of Exercise per Session: 0 min  Stress: No Stress Concern Present (12/05/2023)   [employer] of Occupational Health - Occupational Stress Questionnaire    Feeling of Stress: Not at all  Social Connections: Moderately Isolated (12/05/2023)   Social Connection and Isolation Panel    Frequency of Communication with Friends and Family: Three times a week    Frequency of Social Gatherings with Friends and Family: Once a week    Attends Religious Services: Never    Database Administrator or  Organizations: No    Attends Engineer, Structural: Not on file    Marital Status: Married  Catering Manager Violence: Not At Risk (09/24/2023)   Humiliation, Afraid, Rape, and Kick questionnaire    Fear of Current or Ex-Partner: No    Emotionally Abused: No    Physically Abused: No    Sexually Abused: No    Review of Systems: See HPI, otherwise negative ROS  Physical Exam: BP (!) 225/83  General:   Alert, cooperative. Head:  Normocephalic and atraumatic. Respiratory:  Normal work of breathing. Cardiovascular:  NAD  Impression/Plan: Sarah Hernandez is here for cataract surgery.  Risks, benefits, limitations, and alternatives regarding cataract surgery have been reviewed with the patient.  Questions have been answered.  All parties agreeable.   Elsie Carmine, MD  04/06/2024, 10:23 AM

## 2024-04-06 NOTE — Op Note (Signed)
 PREOPERATIVE DIAGNOSIS:  Nuclear sclerotic cataract of the right eye.   POSTOPERATIVE DIAGNOSIS:  Right Eye Cataract   OPERATIVE PROCEDURE:ORPROCALL@   SURGEON:  Elsie Carmine, MD.   ANESTHESIA:  Anesthesiologist: Ola Donny BROCKS, MD CRNA: Dave Maus, CRNA  1.      Managed anesthesia care. 2.      0.72ml of Shugarcaine was instilled in the eye following the paracentesis.   COMPLICATIONS:  None.   TECHNIQUE:   Stop and chop   DESCRIPTION OF PROCEDURE:  The patient was examined and consented in the preoperative holding area where the aforementioned topical anesthesia was applied to the right eye and then brought back to the Operating Room where the right eye was prepped and draped in the usual sterile ophthalmic fashion and a lid speculum was placed. A paracentesis was created with the side port blade and the anterior chamber was filled with viscoelastic. A near clear corneal incision was performed with the steel keratome. A continuous curvilinear capsulorrhexis was performed with a cystotome followed by the capsulorrhexis forceps. Hydrodissection and hydrodelineation were carried out with BSS on a blunt cannula. The lens was removed in a stop and chop  technique and the remaining cortical material was removed with the irrigation-aspiration handpiece. The capsular bag was inflated with viscoelastic and the intraocular lens was placed in the capsular bag without complication. The remaining viscoelastic was removed from the eye with the irrigation-aspiration handpiece. The wounds were hydrated. The anterior chamber was flushed with BSS and the eye was inflated to physiologic pressure. 0.1ml of Vigamox was placed in the anterior chamber. The wounds were found to be water tight. The eye was dressed with Combigan. The patient was given protective glasses to wear throughout the day and a shield with which to sleep tonight. The patient was also given drops with which to begin a drop regimen today  and will follow-up with me in one day. Implant Name Type Inv. Item Serial No. Manufacturer Lot No. LRB No. Used Action  LENS IOL TECNIS EYHANCE 16.5 - [id number] Intraocular Lens LENS IOL TECNIS EYHANCE 16.5 [id number] [employer]  Right 1 Implanted   Procedure(s): PHACOEMULSIFICATION, CATARACT, WITH IOL INSERTION 8.78 00:46.5 (Right)  Electronically signed: Elsie Carmine 04/06/2024 10:45 AM

## 2024-04-06 NOTE — Anesthesia Postprocedure Evaluation (Signed)
 Anesthesia Post Note  Patient: Sarah Hernandez  Procedure(s) Performed: PHACOEMULSIFICATION, CATARACT, WITH IOL INSERTION 8.78 00:46.5 (Right: Eye)  Patient location during evaluation: PACU Anesthesia Type: MAC Level of consciousness: awake and alert Pain management: pain level controlled Vital Signs Assessment: post-procedure vital signs reviewed and stable Respiratory status: spontaneous breathing, nonlabored ventilation, respiratory function stable and patient connected to nasal cannula oxygen Cardiovascular status: stable and blood pressure returned to baseline Postop Assessment: no apparent nausea or vomiting Anesthetic complications: no   No notable events documented.   Last Vitals:  Vitals:   04/06/24 1044 04/06/24 1052  BP: (!) 179/74 (!) 173/90  Pulse: 61 64  Resp: 20 17  Temp: 36.5 C   SpO2: 98% 97%    Last Pain:  Vitals:   04/06/24 1052  PainSc: 0-No pain                 Anesia Blackwell C Temika Sutphin

## 2024-04-08 ENCOUNTER — Ambulatory Visit: Admitting: Student

## 2024-04-08 ENCOUNTER — Encounter: Payer: Self-pay | Admitting: Student

## 2024-04-08 VITALS — BP 160/86 | HR 72 | Ht 64.0 in | Wt 164.0 lb

## 2024-04-08 DIAGNOSIS — K2100 Gastro-esophageal reflux disease with esophagitis, without bleeding: Secondary | ICD-10-CM

## 2024-04-08 DIAGNOSIS — E782 Mixed hyperlipidemia: Secondary | ICD-10-CM | POA: Diagnosis not present

## 2024-04-08 DIAGNOSIS — F411 Generalized anxiety disorder: Secondary | ICD-10-CM | POA: Diagnosis not present

## 2024-04-08 DIAGNOSIS — I10 Essential (primary) hypertension: Secondary | ICD-10-CM | POA: Diagnosis not present

## 2024-04-08 DIAGNOSIS — J4520 Mild intermittent asthma, uncomplicated: Secondary | ICD-10-CM

## 2024-04-08 MED ORDER — HYDRALAZINE HCL 10 MG PO TABS: 10.0000 mg | ORAL_TABLET | Freq: Three times a day (TID) | ORAL | 1 refills | Status: DC

## 2024-04-08 MED ORDER — SERTRALINE HCL 100 MG PO TABS: 100.0000 mg | ORAL_TABLET | Freq: Every day | ORAL | 1 refills | Status: DC

## 2024-04-08 NOTE — Progress Notes (Signed)
 Established Patient Office Visit  Subjective   Patient ID: Sarah Hernandez, female    DOB: [birth year]  Age: 73 y.o. MRN: [id number]  Chief Complaint  Patient presents with   Hypertension    Rock Sarah Hernandez with medical hx listed below presents today for transfer of care. Reports increased anxiety   Patient Active Problem List   Diagnosis Date Noted   History of basal cell cancer 09/13/2019   Generalized anxiety disorder 09/09/2018   Esophagitis, reflux 10/05/2014   Hyperlipidemia, mixed 10/05/2014   Benign hypertension 10/05/2014   Irritable bowel syndrome with constipation 10/05/2014   Mild intermittent asthma without complication 10/05/2014   Allergic rhinitis, seasonal 10/05/2014   DD (diverticular disease) 12/13/2013      ROS Refer to HPI    Objective:     Outpatient Encounter Medications as of 04/08/2024  Medication Sig   albuterol  (PROVENTIL ) (2.5 MG/3ML) 0.083% nebulizer solution Inhale 3 mLs into the lungs 4 (four) times daily as needed. Reported on 08/16/2015   albuterol  (VENTOLIN  HFA) 108 (90 Base) MCG/ACT inhaler TAKE 2 PUFFS BY MOUTH EVERY 6 HOURS AS NEEDED FOR WHEEZE OR SHORTNESS OF BREATH   atorvastatin  (LIPITOR) 40 MG tablet TAKE 1 TABLET BY MOUTH EVERY DAY   cetirizine (ZYRTEC) 10 MG tablet Take 10 mg by mouth daily as needed for allergies.   Cyanocobalamin (VITAMIN B-12 PO) Place 1 drop under the tongue daily.   fluticasone  (FLONASE) 50 MCG/ACT nasal spray Place 1 spray into both nostrils daily as needed for allergies or rhinitis.   hydrALAZINE (APRESOLINE) 10 MG tablet Take 1 tablet (10 mg total) by mouth 3 (three) times daily.   lisinopril  (ZESTRIL ) 40 MG tablet Take 1 tablet (40 mg total) by mouth daily.   metoprolol  succinate (TOPROL -XL) 50 MG 24 hr tablet TAKE 1 TAB IN THE MORNING AND 1/2 TAB IN THE EVENING   naproxen sodium (ALEVE) 220 MG tablet Take 220 mg by mouth 2 (two) times daily as needed.   omeprazole  (PRILOSEC) 20 MG capsule TAKE 1  CAPSULE BY MOUTH EVERY DAY   sertraline  (ZOLOFT ) 100 MG tablet Take 1 tablet (100 mg total) by mouth daily.   [DISCONTINUED] ipratropium-albuterol  (DUONEB) 0.5-2.5 (3) MG/3ML SOLN Take 3 mLs by nebulization every 6 (six) hours as needed.   [DISCONTINUED] nitroGLYCERIN  (NITROSTAT ) 0.4 MG SL tablet Place 1 tablet (0.4 mg total) under the tongue every 5 (five) minutes as needed for chest pain.   [DISCONTINUED] sertraline  (ZOLOFT ) 50 MG tablet TAKE 1 TABLET BY MOUTH EVERY DAY   hydrochlorothiazide  (MICROZIDE ) 12.5 MG capsule TAKE 1 CAPSULE BY MOUTH EVERY DAY (Patient not taking: Reported on 04/08/2024)   VITAMIN D PO Take 2,000 Units by mouth daily. (Patient not taking: Reported on 04/08/2024)   VITAMIN E PO Take 1 tablet by mouth daily. (Patient not taking: Reported on 04/08/2024)   No facility-administered encounter medications on file as of 04/08/2024.    BP (!) 160/86   Pulse 72   Ht 5' 4 (1.626 m)   Wt 164 lb (74.4 kg)   SpO2 97%   BMI 28.15 kg/m  BP Readings from Last 3 Encounters:  04/08/24 (!) 160/86  04/06/24 (!) 173/90  03/23/24 (!) 149/78    Physical Exam Constitutional:      Appearance: Normal appearance.  HENT:     Head: Normocephalic and atraumatic.  Eyes:     Extraocular Movements: Extraocular movements intact.     Conjunctiva/sclera: Conjunctivae normal.     Pupils: Pupils are  equal, round, and reactive to light.  Cardiovascular:     Rate and Rhythm: Normal rate and regular rhythm.  Pulmonary:     Effort: Pulmonary effort is normal.     Breath sounds: No rhonchi or rales.  Abdominal:     General: Abdomen is flat. Bowel sounds are normal. There is no distension.     Palpations: Abdomen is soft.     Tenderness: There is no abdominal tenderness.  Musculoskeletal:        General: Normal range of motion.     Right lower leg: No edema.     Left lower leg: No edema.  Skin:    General: Skin is warm and dry.     Capillary Refill: Capillary refill takes less than 2  seconds.  Neurological:     General: No focal deficit present.     Mental Status: She is alert and oriented to person, place, and time.  Psychiatric:        Mood and Affect: Mood normal.        Behavior: Behavior normal.        04/08/2024    9:30 AM 12/12/2023    8:10 AM 10/13/2023    8:41 AM  Depression screen PHQ 2/9  Decreased Interest 0 0 0  Down, Depressed, Hopeless 0 0 0  PHQ - 2 Score 0 0 0  Altered sleeping 0 0 0  Tired, decreased energy 0 0 0  Change in appetite 0 0 0  Feeling bad or failure about yourself  0 0 0  Trouble concentrating 0 0 0  Moving slowly or fidgety/restless 0 0 0  Suicidal thoughts 0 0 0  PHQ-9 Score 0 0  0   Difficult doing work/chores Not difficult at all Not difficult at all Not difficult at all     Data saved with a previous flowsheet row definition       04/08/2024    9:30 AM 12/12/2023    8:10 AM 10/13/2023    8:42 AM 04/15/2023    8:26 AM  GAD 7 : Generalized Anxiety Score  Nervous, Anxious, on Edge 0 0 1 0  Control/stop worrying 0 0 0 0  Worry too much - different things 0 0 0 0  Trouble relaxing 0 0 0 0  Restless 0 0 0 0  Easily annoyed or irritable 0 0 0 0  Afraid - awful might happen 0 0 0 0  Total GAD 7 Score 0 0 1 0  Anxiety Difficulty Not difficult at all Not difficult at all Not difficult at all Not difficult at all    No results found for any visits on 04/08/24.  Last CBC Lab Results  Component Value Date   WBC 7.3 10/13/2023   HGB 12.6 10/13/2023   HCT 38.7 10/13/2023   MCV 94 10/13/2023   MCH 30.4 10/13/2023   RDW 12.7 10/13/2023   PLT 277 10/13/2023   Last metabolic panel Lab Results  Component Value Date   GLUCOSE 92 12/12/2023   NA 138 12/12/2023   K 5.0 12/12/2023   CL 103 12/12/2023   CO2 22 12/12/2023   BUN 18 12/12/2023   CREATININE 1.05 (H) 12/12/2023   EGFR 55 (L) 12/12/2023   CALCIUM  9.1 12/12/2023   PROT 6.6 10/13/2023   ALBUMIN 4.2 10/13/2023   LABGLOB 2.4 10/13/2023   AGRATIO 1.5  10/11/2022   BILITOT 0.4 10/13/2023   ALKPHOS 100 10/13/2023   AST 17 10/13/2023   ALT 19  10/13/2023   ANIONGAP 9 07/09/2019   Last lipids Lab Results  Component Value Date   CHOL 178 10/13/2023   HDL 57 10/13/2023   LDLCALC 98 10/13/2023   TRIG 133 10/13/2023   CHOLHDL 3.1 10/13/2023   Last hemoglobin A1c Lab Results  Component Value Date   HGBA1C 5.6 10/11/2022   Last thyroid functions Lab Results  Component Value Date   TSH 5.060 (H) 10/13/2023   T3TOTAL 125 09/09/2018   T4TOTAL 8.1 09/09/2018     The 10-year ASCVD risk score (Arnett DK, et al., 2019) is: 30.7%    Assessment & Plan:  Benign hypertension Assessment & Plan: Current medications are metoprolol  50 mg in the morning and 25 mg at night and lisinopril  40 mg. Also on hydrochlorothiazide  12.5 mg but stopped this about 1 month ago due to LE cramping. No LE cramping after stopping medication.  Takes aleve 1-2 times a week. Reports eating low salt. Does not drink much water. On chart review no anemia in May, CMP with normal K on July while on hydrochlorothiazide . Was taking magnesium without benefit.  Start hydralazine for BP.  Continue metoprolol  and lisinopril  Follow up in 1 month.    Hyperlipidemia, mixed Assessment & Plan: The 10-year ASCVD risk score (Arnett DK, et al., 2019) is: 30.7%. Is on atorvastatin  40 mg daily. Lipid panel next visit.    Mild intermittent asthma without complication Assessment & Plan: Using albuterol  as needed, has not need to use this lately. Continue current medication.    Gastroesophageal reflux disease with esophagitis without hemorrhage Assessment & Plan: Is using omeprazole  20 mg daily and doing well on this. Discussed reducing soda intake.    Generalized anxiety disorder Assessment & Plan: GAD7 is 0. Reports she is feeling pressure related to work responsibility. Feels unable to separate herself from work and has trouble relaxing due to work matters. Discussed  triturating dose of zoloft . Increase to zoloft  100 mg daily.    Other orders -     Sertraline  HCl; Take 1 tablet (100 mg total) by mouth daily.  Dispense: 30 tablet; Refill: 1 -     hydrALAZINE HCl; Take 1 tablet (10 mg total) by mouth 3 (three) times daily.  Dispense: 90 tablet; Refill: 1     Return in about 4 weeks (around 05/06/2024) for HTN, mood.    Harlene Saddler, MD

## 2024-04-08 NOTE — Assessment & Plan Note (Signed)
 Is using omeprazole  20 mg daily and doing well on this. Discussed reducing soda intake.

## 2024-04-08 NOTE — Assessment & Plan Note (Addendum)
 Current medications are metoprolol  50 mg in the morning and 25 mg at night and lisinopril  40 mg. Also on hydrochlorothiazide  12.5 mg but stopped this about 1 month ago due to LE cramping. No LE cramping after stopping medication.  Takes aleve 1-2 times a week. Reports eating low salt. Does not drink much water. On chart review no anemia in May, CMP with normal K on July while on hydrochlorothiazide . Was taking magnesium without benefit.  Start hydralazine for BP.  Continue metoprolol  and lisinopril  Follow up in 1 month.

## 2024-04-08 NOTE — Telephone Encounter (Signed)
 Requested Prescriptions  Pending Prescriptions Disp Refills   hydrochlorothiazide  (MICROZIDE ) 12.5 MG capsule [Pharmacy Med Name: HYDROCHLOROTHIAZIDE  12.5 MG CP] 90 capsule 0    Sig: TAKE 1 CAPSULE BY MOUTH EVERY DAY     Cardiovascular: Diuretics - Thiazide Failed - 04/08/2024  8:55 AM      Failed - Cr in normal range and within 180 days    Creatinine, Ser  Date Value Ref Range Status  12/12/2023 1.05 (H) 0.57 - 1.00 mg/dL Final         Failed - Last BP in normal range    BP Readings from Last 1 Encounters:  04/06/24 (!) 173/90         Passed - K in normal range and within 180 days    Potassium  Date Value Ref Range Status  12/12/2023 5.0 3.5 - 5.2 mmol/L Final         Passed - Na in normal range and within 180 days    Sodium  Date Value Ref Range Status  12/12/2023 138 134 - 144 mmol/L Final         Passed - Valid encounter within last 6 months    Recent Outpatient Visits           3 months ago Benign hypertension   [facility], Leita DEL, MD   5 months ago Annual physical exam   [facility] at [facility], Leita DEL, MD       Future Appointments             In 6 months Lemon Raisin, MD [facility] at [facility], [phone] [place]

## 2024-04-08 NOTE — Assessment & Plan Note (Signed)
 The 10-year ASCVD risk score (Arnett DK, et al., 2019) is: 30.7%. Is on atorvastatin  40 mg daily. Lipid panel next visit.

## 2024-04-08 NOTE — Assessment & Plan Note (Addendum)
 GAD7 is 0. Reports she is feeling pressure related to work responsibility. Feels unable to separate herself from work and has trouble relaxing due to work matters. Discussed triturating dose of zoloft . Increase to zoloft  100 mg daily.

## 2024-04-08 NOTE — Assessment & Plan Note (Signed)
 Using albuterol  as needed, has not need to use this lately. Continue current medication.

## 2024-04-16 ENCOUNTER — Encounter: Payer: Self-pay | Admitting: Student

## 2024-04-19 NOTE — Telephone Encounter (Signed)
Please review and advise patient.

## 2024-04-21 ENCOUNTER — Encounter: Admitting: Student

## 2024-05-04 ENCOUNTER — Other Ambulatory Visit: Payer: Self-pay | Admitting: Student

## 2024-05-06 NOTE — Telephone Encounter (Signed)
 Dose increase- has f/u scheduled, 04/08/24 #30 1RF Requested Prescriptions  Pending Prescriptions Disp Refills   sertraline  (ZOLOFT ) 100 MG tablet [Pharmacy Med Name: SERTRALINE  HCL 100 MG TABLET] 90 tablet 1    Sig: TAKE 1 TABLET BY MOUTH EVERY DAY     Psychiatry:  Antidepressants - SSRI - sertraline  Passed - 05/06/2024 10:53 AM      Passed - AST in normal range and within 360 days    AST  Date Value Ref Range Status  10/13/2023 17 0 - 40 IU/L Final         Passed - ALT in normal range and within 360 days    ALT  Date Value Ref Range Status  10/13/2023 19 0 - 32 IU/L Final         Passed - Completed PHQ-2 or PHQ-9 in the last 360 days      Passed - Valid encounter within last 6 months    Recent Outpatient Visits           4 weeks ago Benign hypertension   [facility], MD   4 months ago Benign hypertension   [facility], Leita DEL, MD   6 months ago Annual physical exam   [facility] at [facility], Leita DEL, MD       Future Appointments             In 5 months Lemon Raisin, MD [facility] at [facility], [phone] [place]

## 2024-05-17 ENCOUNTER — Encounter: Payer: Self-pay | Admitting: Student

## 2024-05-17 ENCOUNTER — Ambulatory Visit: Admitting: Student

## 2024-05-17 VITALS — BP 144/78 | HR 73 | Ht 64.0 in | Wt 162.0 lb

## 2024-05-17 DIAGNOSIS — I10 Essential (primary) hypertension: Secondary | ICD-10-CM | POA: Diagnosis not present

## 2024-05-17 DIAGNOSIS — K2100 Gastro-esophageal reflux disease with esophagitis, without bleeding: Secondary | ICD-10-CM

## 2024-05-17 DIAGNOSIS — F411 Generalized anxiety disorder: Secondary | ICD-10-CM

## 2024-05-17 MED ORDER — LISINOPRIL 40 MG PO TABS: 40.0000 mg | ORAL_TABLET | Freq: Every day | ORAL | 1 refills | Status: AC

## 2024-05-17 MED ORDER — SERTRALINE HCL 100 MG PO TABS: 100.0000 mg | ORAL_TABLET | Freq: Every day | ORAL | 1 refills | Status: AC

## 2024-05-17 MED ORDER — HYDRALAZINE HCL 10 MG PO TABS: 10.0000 mg | ORAL_TABLET | Freq: Two times a day (BID) | ORAL | 1 refills | Status: AC

## 2024-05-17 MED ORDER — OMEPRAZOLE 20 MG PO CPDR: 20.0000 mg | DELAYED_RELEASE_CAPSULE | Freq: Every day | ORAL | 3 refills | Status: AC

## 2024-05-17 NOTE — Assessment & Plan Note (Signed)
 Attempted to titrate off but had return of symptoms. Continue omeprazole .

## 2024-05-17 NOTE — Assessment & Plan Note (Addendum)
 Started on hydralazine  10 mg three times daily at [email or web address]. Difficulty remember to take hydralazine  three times a day and has not been taking this. Reports home pressure is 130/60 typically but did not bring her log. Currently taking lisinopril  40 mg daily, metoprolol , and hydralazine  25 as needed with systolic greater than 150, which she last took 3 weeks ago. Hydralazine  25 mg is an old rx from cardiology. Denies dizziness of side effects with hydralazine .  Systolic BP appears to remain high in office today. Will have her take hydralazine  10 mg twice daily as TID dosing was difficult for her to remember. Keep BP log, she will call if BP remains elevated or having lows.

## 2024-05-17 NOTE — Patient Instructions (Addendum)
 Please take hydralazine  10 mg twice a day Please bring your blood pressure cuff to your next appointment   You can check [email or web address] for recommendations on BP machines   Please call me if you are feeling dizzy or your blood pressure is running low <90/60, or if your blood pressure remains elevated

## 2024-05-17 NOTE — Assessment & Plan Note (Signed)
 Reports improvement in mood with increase in zoloft . Continue zoloft  100 mg daily.

## 2024-05-17 NOTE — Progress Notes (Signed)
 "  Established Patient Office Visit  Subjective   Patient ID: Sarah Hernandez, female    DOB: 1951-03-01  Age: 73 y.o. MRN: [id number]  Chief Complaint  Patient presents with   Hypertension    Sarah Hernandez is a 73 y.o. person with medical hx listed below who presents today for hypertension follow up.   Patient Active Problem List   Diagnosis Date Noted   History of basal cell cancer 09/13/2019   Generalized anxiety disorder 09/09/2018   Esophagitis, reflux 10/05/2014   Hyperlipidemia, mixed 10/05/2014   Benign hypertension 10/05/2014   Irritable bowel syndrome with constipation 10/05/2014   Mild intermittent asthma without complication 10/05/2014   Allergic rhinitis, seasonal 10/05/2014   DD (diverticular disease) 12/13/2013      ROS Refer to HPI    Objective:     Outpatient Encounter Medications as of 05/17/2024  Medication Sig   albuterol  (PROVENTIL ) (2.5 MG/3ML) 0.083% nebulizer solution Inhale 3 mLs into the lungs 4 (four) times daily as needed. Reported on 08/16/2015   albuterol  (VENTOLIN  HFA) 108 (90 Base) MCG/ACT inhaler TAKE 2 PUFFS BY MOUTH EVERY 6 HOURS AS NEEDED FOR WHEEZE OR SHORTNESS OF BREATH (Patient taking differently: as needed. TAKE 2 PUFFS BY MOUTH EVERY 6 HOURS AS NEEDED FOR WHEEZE OR SHORTNESS OF BREATH)   atorvastatin  (LIPITOR) 40 MG tablet TAKE 1 TABLET BY MOUTH EVERY DAY   cetirizine (ZYRTEC) 10 MG tablet Take 10 mg by mouth daily as needed for allergies.   fluticasone  (FLONASE) 50 MCG/ACT nasal spray Place 1 spray into both nostrils daily as needed for allergies or rhinitis.   metoprolol  succinate (TOPROL -XL) 50 MG 24 hr tablet TAKE 1 TAB IN THE MORNING AND 1/2 TAB IN THE EVENING   [DISCONTINUED] hydrALAZINE  (APRESOLINE ) 25 MG tablet Take 25 mg by mouth in the morning and at bedtime. (Patient taking differently: Take 25 mg by mouth in the morning and at bedtime. Take 1 tablet by mouth 2 (two) times daily as needed (systolic blood pressure of 150 or  above))   [DISCONTINUED] hydrochlorothiazide  (MICROZIDE ) 12.5 MG capsule TAKE 1 CAPSULE BY MOUTH EVERY DAY   [DISCONTINUED] lisinopril  (ZESTRIL ) 40 MG tablet Take 1 tablet (40 mg total) by mouth daily.   [DISCONTINUED] omeprazole  (PRILOSEC) 20 MG capsule TAKE 1 CAPSULE BY MOUTH EVERY DAY   [DISCONTINUED] sertraline  (ZOLOFT ) 100 MG tablet Take 1 tablet (100 mg total) by mouth daily.   Cyanocobalamin (VITAMIN B-12 PO) Place 1 drop under the tongue daily. (Patient not taking: Reported on 05/17/2024)   hydrALAZINE  (APRESOLINE ) 10 MG tablet Take 1 tablet (10 mg total) by mouth in the morning and at bedtime.   lisinopril  (ZESTRIL ) 40 MG tablet Take 1 tablet (40 mg total) by mouth daily.   omeprazole  (PRILOSEC) 20 MG capsule Take 1 capsule (20 mg total) by mouth daily.   sertraline  (ZOLOFT ) 100 MG tablet Take 1 tablet (100 mg total) by mouth daily.   VITAMIN D PO Take 2,000 Units by mouth daily. (Patient not taking: Reported on 05/17/2024)   VITAMIN E PO Take 1 tablet by mouth daily. (Patient not taking: Reported on 05/17/2024)   [DISCONTINUED] hydrALAZINE  (APRESOLINE ) 10 MG tablet Take 1 tablet (10 mg total) by mouth 3 (three) times daily. (Patient not taking: Reported on 05/17/2024)   [DISCONTINUED] naproxen sodium (ALEVE) 220 MG tablet Take 220 mg by mouth 2 (two) times daily as needed. (Patient not taking: Reported on 05/17/2024)   No facility-administered encounter medications on file as of 05/17/2024.  BP (!) 144/78   Pulse 73   Ht 5' 4 (1.626 m)   Wt 162 lb (73.5 kg)   SpO2 97%   BMI 27.81 kg/m  BP Readings from Last 3 Encounters:  05/17/24 (!) 144/78  04/08/24 (!) 160/86  04/06/24 (!) 173/90    Physical Exam Constitutional:      Appearance: Normal appearance.  HENT:     Head: Normocephalic and atraumatic.  Cardiovascular:     Rate and Rhythm: Normal rate and regular rhythm.     Heart sounds: No murmur heard. Pulmonary:     Effort: Pulmonary effort is normal.     Breath  sounds: No rhonchi or rales.  Abdominal:     General: Abdomen is flat. Bowel sounds are normal. There is no distension.     Palpations: Abdomen is soft.     Tenderness: There is no abdominal tenderness.  Musculoskeletal:        General: Normal range of motion.     Right lower leg: No edema.     Left lower leg: No edema.  Skin:    General: Skin is warm and dry.     Capillary Refill: Capillary refill takes less than 2 seconds.  Neurological:     General: No focal deficit present.     Mental Status: She is alert and oriented to person, place, and time.  Psychiatric:        Mood and Affect: Mood normal.        Behavior: Behavior normal.        05/17/2024    8:35 AM 04/08/2024    9:30 AM 12/12/2023    8:10 AM  Depression screen PHQ 2/9  Decreased Interest 0 0 0  Down, Depressed, Hopeless 0 0 0  PHQ - 2 Score 0 0 0  Altered sleeping  0 0  Tired, decreased energy  0 0  Change in appetite  0 0  Feeling bad or failure about yourself   0 0  Trouble concentrating  0 0  Moving slowly or fidgety/restless  0 0  Suicidal thoughts  0 0  PHQ-9 Score  0 0   Difficult doing work/chores  Not difficult at all Not difficult at all     Data saved with a previous flowsheet row definition       05/17/2024    8:35 AM 04/08/2024    9:30 AM 12/12/2023    8:10 AM 10/13/2023    8:42 AM  GAD 7 : Generalized Anxiety Score  Nervous, Anxious, on Edge 0 0 0 1  Control/stop worrying 0 0 0 0  Worry too much - different things  0 0 0  Trouble relaxing  0 0 0  Restless  0 0 0  Easily annoyed or irritable  0 0 0  Afraid - awful might happen  0 0 0  Total GAD 7 Score  0 0 1  Anxiety Difficulty  Not difficult at all Not difficult at all Not difficult at all    No results found for any visits on 05/17/24.  Last CBC Lab Results  Component Value Date   WBC 7.3 10/13/2023   HGB 12.6 10/13/2023   HCT 38.7 10/13/2023   MCV 94 10/13/2023   MCH 30.4 10/13/2023   RDW 12.7 10/13/2023   PLT 277  10/13/2023   Last metabolic panel Lab Results  Component Value Date   GLUCOSE 92 12/12/2023   NA 138 12/12/2023   K 5.0 12/12/2023   CL  103 12/12/2023   CO2 22 12/12/2023   BUN 18 12/12/2023   CREATININE 1.05 (H) 12/12/2023   EGFR 55 (L) 12/12/2023   CALCIUM  9.1 12/12/2023   PROT 6.6 10/13/2023   ALBUMIN 4.2 10/13/2023   LABGLOB 2.4 10/13/2023   AGRATIO 1.5 10/11/2022   BILITOT 0.4 10/13/2023   ALKPHOS 100 10/13/2023   AST 17 10/13/2023   ALT 19 10/13/2023   ANIONGAP 9 07/09/2019   Last lipids Lab Results  Component Value Date   CHOL 178 10/13/2023   HDL 57 10/13/2023   LDLCALC 98 10/13/2023   TRIG 133 10/13/2023   CHOLHDL 3.1 10/13/2023   Last hemoglobin A1c Lab Results  Component Value Date   HGBA1C 5.6 10/11/2022      The 10-year ASCVD risk score (Arnett DK, et al., 2019) is: 25.7%    Assessment & Plan:  Benign hypertension Assessment & Plan: Started on hydralazine  10 mg three times daily at las tvisit. Difficulty remember to take hydralazine  three times a day and has not been taking this. Reports home pressure is 130/60 typically but did not bring her log. Currently taking lisinopril  40 mg daily, metoprolol , and hydralazine  25 as needed with systolic greater than 150, which she last took 3 weeks ago. Hydralazine  25 mg is an old rx from cardiology. Denies dizziness of side effects with hydralazine .  Systolic BP appears to remain high in office today. Will have her take hydralazine  10 mg twice daily as TID dosing was difficult for her to remember. Keep BP log, she will call if BP remains elevated or having lows.   Orders: -     Lisinopril ; Take 1 tablet (40 mg total) by mouth daily.  Dispense: 90 tablet; Refill: 1  Gastroesophageal reflux disease with esophagitis, unspecified whether hemorrhage Assessment & Plan: Attempted to titrate off but had return of symptoms. Continue omeprazole .  Orders: -     Omeprazole ; Take 1 capsule (20 mg total) by mouth daily.   Dispense: 90 capsule; Refill: 3  Generalized anxiety disorder Assessment & Plan: Reports improvement in mood with increase in zoloft . Continue zoloft  100 mg daily.    Other orders -     hydrALAZINE  HCl; Take 1 tablet (10 mg total) by mouth in the morning and at bedtime.  Dispense: 180 tablet; Refill: 1 -     Sertraline  HCl; Take 1 tablet (100 mg total) by mouth daily.  Dispense: 90 tablet; Refill: 1     No follow-ups on file.    Harlene Saddler, MD "

## 2024-09-29 ENCOUNTER — Ambulatory Visit

## 2024-09-30 ENCOUNTER — Ambulatory Visit

## 2024-10-14 ENCOUNTER — Encounter: Admitting: Student

## 2024-10-25 ENCOUNTER — Encounter: Admitting: Student

## 2024-11-01 ENCOUNTER — Encounter: Admitting: Student
